# Patient Record
Sex: Female | Born: 1955
Health system: Southern US, Community
[De-identification: ages and names within clinical notes are randomized; demographics above are authoritative.]

## PROBLEM LIST (undated history)

## (undated) DIAGNOSIS — E785 Hyperlipidemia, unspecified: Secondary | ICD-10-CM

## (undated) DIAGNOSIS — K219 Gastro-esophageal reflux disease without esophagitis: Secondary | ICD-10-CM

## (undated) DIAGNOSIS — G4700 Insomnia, unspecified: Secondary | ICD-10-CM

## (undated) DIAGNOSIS — L409 Psoriasis, unspecified: Secondary | ICD-10-CM

## (undated) DIAGNOSIS — E039 Hypothyroidism, unspecified: Secondary | ICD-10-CM

## (undated) DIAGNOSIS — E782 Mixed hyperlipidemia: Secondary | ICD-10-CM

## (undated) HISTORY — DX: Hyperlipidemia, unspecified: E78.5

## (undated) HISTORY — DX: Gastro-esophageal reflux disease without esophagitis: K21.9

## (undated) HISTORY — DX: Hypothyroidism, unspecified: E03.9

---
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
## 1988-04-25 HISTORY — PX: BREAST EXCISIONAL BIOPSY: SUR124

## 1991-04-26 HISTORY — PX: MYOMECTOMY: SHX85

## 1996-04-25 HISTORY — PX: CHOLECYSTECTOMY: SHX55

## 1997-09-26 ENCOUNTER — Observation Stay (HOSPITAL_COMMUNITY): Admission: RE | Admit: 1997-09-26 | Discharge: 1997-09-27 | Payer: Self-pay

## 1998-03-31 ENCOUNTER — Ambulatory Visit (HOSPITAL_COMMUNITY): Admission: RE | Admit: 1998-03-31 | Discharge: 1998-03-31 | Payer: Self-pay | Admitting: Gastroenterology

## 1998-09-14 ENCOUNTER — Other Ambulatory Visit: Admission: RE | Admit: 1998-09-14 | Discharge: 1998-09-14 | Payer: Self-pay | Admitting: Obstetrics and Gynecology

## 1998-09-30 ENCOUNTER — Other Ambulatory Visit: Admission: RE | Admit: 1998-09-30 | Discharge: 1998-09-30 | Payer: Self-pay | Admitting: Obstetrics and Gynecology

## 1999-09-28 ENCOUNTER — Other Ambulatory Visit: Admission: RE | Admit: 1999-09-28 | Discharge: 1999-09-28 | Payer: Self-pay | Admitting: Obstetrics and Gynecology

## 2000-10-25 ENCOUNTER — Other Ambulatory Visit: Admission: RE | Admit: 2000-10-25 | Discharge: 2000-10-25 | Payer: Self-pay | Admitting: Obstetrics and Gynecology

## 2002-01-01 ENCOUNTER — Other Ambulatory Visit: Admission: RE | Admit: 2002-01-01 | Discharge: 2002-01-01 | Payer: Self-pay | Admitting: Obstetrics and Gynecology

## 2003-01-22 ENCOUNTER — Other Ambulatory Visit: Admission: RE | Admit: 2003-01-22 | Discharge: 2003-01-22 | Payer: Self-pay | Admitting: Family Medicine

## 2004-07-28 ENCOUNTER — Ambulatory Visit: Payer: Self-pay | Admitting: Family Medicine

## 2004-08-04 ENCOUNTER — Other Ambulatory Visit: Admission: RE | Admit: 2004-08-04 | Discharge: 2004-08-04 | Payer: Self-pay | Admitting: Family Medicine

## 2004-08-04 ENCOUNTER — Ambulatory Visit: Payer: Self-pay | Admitting: Family Medicine

## 2004-08-04 LAB — CONVERTED CEMR LAB

## 2004-09-15 ENCOUNTER — Ambulatory Visit (HOSPITAL_COMMUNITY): Admission: RE | Admit: 2004-09-15 | Discharge: 2004-09-15 | Payer: Self-pay | Admitting: Family Medicine

## 2005-09-27 ENCOUNTER — Ambulatory Visit (HOSPITAL_COMMUNITY): Admission: RE | Admit: 2005-09-27 | Discharge: 2005-09-27 | Payer: Self-pay | Admitting: Obstetrics and Gynecology

## 2006-10-05 ENCOUNTER — Ambulatory Visit: Payer: Self-pay | Admitting: Family Medicine

## 2006-11-01 ENCOUNTER — Encounter (INDEPENDENT_AMBULATORY_CARE_PROVIDER_SITE_OTHER): Payer: Self-pay | Admitting: Gastroenterology

## 2006-11-01 ENCOUNTER — Ambulatory Visit (HOSPITAL_COMMUNITY): Admission: RE | Admit: 2006-11-01 | Discharge: 2006-11-01 | Payer: Self-pay | Admitting: Gastroenterology

## 2006-11-06 ENCOUNTER — Encounter: Payer: Self-pay | Admitting: Family Medicine

## 2006-11-06 DIAGNOSIS — E039 Hypothyroidism, unspecified: Secondary | ICD-10-CM | POA: Insufficient documentation

## 2006-11-06 DIAGNOSIS — K219 Gastro-esophageal reflux disease without esophagitis: Secondary | ICD-10-CM | POA: Insufficient documentation

## 2006-11-06 DIAGNOSIS — J45909 Unspecified asthma, uncomplicated: Secondary | ICD-10-CM | POA: Insufficient documentation

## 2006-11-06 DIAGNOSIS — J309 Allergic rhinitis, unspecified: Secondary | ICD-10-CM | POA: Insufficient documentation

## 2006-11-28 ENCOUNTER — Ambulatory Visit: Payer: Self-pay | Admitting: Family Medicine

## 2006-11-28 DIAGNOSIS — M25569 Pain in unspecified knee: Secondary | ICD-10-CM

## 2006-11-28 DIAGNOSIS — L259 Unspecified contact dermatitis, unspecified cause: Secondary | ICD-10-CM | POA: Insufficient documentation

## 2006-11-28 DIAGNOSIS — M7061 Trochanteric bursitis, right hip: Secondary | ICD-10-CM | POA: Insufficient documentation

## 2006-11-28 LAB — CONVERTED CEMR LAB
AST: 27 units/L (ref 0–37)
Bilirubin, Direct: 0.1 mg/dL (ref 0.0–0.3)
Cholesterol: 237 mg/dL (ref 0–200)
Direct LDL: 145.2 mg/dL
Total Bilirubin: 0.8 mg/dL (ref 0.3–1.2)
Total CHOL/HDL Ratio: 4
Uric Acid, Serum: 5.7 mg/dL (ref 2.4–7.0)
VLDL: 28 mg/dL (ref 0–40)

## 2007-10-17 ENCOUNTER — Ambulatory Visit (HOSPITAL_COMMUNITY): Admission: RE | Admit: 2007-10-17 | Discharge: 2007-10-17 | Payer: Self-pay | Admitting: Obstetrics and Gynecology

## 2008-06-09 ENCOUNTER — Ambulatory Visit: Payer: Self-pay | Admitting: Family Medicine

## 2009-02-19 ENCOUNTER — Ambulatory Visit (HOSPITAL_COMMUNITY): Admission: RE | Admit: 2009-02-19 | Discharge: 2009-02-19 | Payer: Self-pay | Admitting: Obstetrics and Gynecology

## 2009-08-12 ENCOUNTER — Ambulatory Visit: Payer: Self-pay | Admitting: Family Medicine

## 2009-08-12 DIAGNOSIS — Z888 Allergy status to other drugs, medicaments and biological substances status: Secondary | ICD-10-CM | POA: Insufficient documentation

## 2009-08-12 LAB — CONVERTED CEMR LAB
ALT: 36 units/L — ABNORMAL HIGH (ref 0–35)
Albumin: 3.1 g/dL — ABNORMAL LOW (ref 3.5–5.2)
Alkaline Phosphatase: 101 units/L (ref 39–117)
BUN: 21 mg/dL (ref 6–23)
Basophils Absolute: 0 10*3/uL (ref 0.0–0.1)
Creatinine, Ser: 1.8 mg/dL — ABNORMAL HIGH (ref 0.4–1.2)
Eosinophils Relative: 2.7 % (ref 0.0–5.0)
HCT: 42.1 % (ref 36.0–46.0)
Hemoglobin: 14.6 g/dL (ref 12.0–15.0)
Lymphocytes Relative: 1 % — ABNORMAL LOW (ref 12.0–46.0)
Lymphs Abs: 0.1 10*3/uL — ABNORMAL LOW (ref 0.7–4.0)
Monocytes Absolute: 0.2 10*3/uL (ref 0.1–1.0)
Monocytes Relative: 1.3 % — ABNORMAL LOW (ref 3.0–12.0)
Neutro Abs: 12 10*3/uL — ABNORMAL HIGH (ref 1.4–7.7)
Neutrophils Relative %: 95 % — ABNORMAL HIGH (ref 43.0–77.0)
Potassium: 4.5 meq/L (ref 3.5–5.1)
RBC: 4.49 M/uL (ref 3.87–5.11)
Total Bilirubin: 1 mg/dL (ref 0.3–1.2)

## 2009-08-13 ENCOUNTER — Ambulatory Visit: Payer: Self-pay | Admitting: Family Medicine

## 2009-08-13 DIAGNOSIS — N259 Disorder resulting from impaired renal tubular function, unspecified: Secondary | ICD-10-CM | POA: Insufficient documentation

## 2009-08-19 ENCOUNTER — Ambulatory Visit: Payer: Self-pay | Admitting: Family Medicine

## 2009-08-24 ENCOUNTER — Telehealth: Payer: Self-pay | Admitting: Family Medicine

## 2009-08-24 LAB — CONVERTED CEMR LAB
ALT: 38 units/L — ABNORMAL HIGH (ref 0–35)
Albumin: 3.6 g/dL (ref 3.5–5.2)
Alkaline Phosphatase: 79 units/L (ref 39–117)
BUN: 10 mg/dL (ref 6–23)
Basophils Absolute: 0 10*3/uL (ref 0.0–0.1)
CO2: 30 meq/L (ref 19–32)
Chloride: 101 meq/L (ref 96–112)
GFR calc non Af Amer: 55.43 mL/min (ref >60)
Glucose, Bld: 109 mg/dL — ABNORMAL HIGH (ref 70–99)
Hemoglobin: 14.5 g/dL (ref 12.0–15.0)
Lymphocytes Relative: 14 % (ref 12.0–46.0)
Lymphs Abs: 1.5 10*3/uL (ref 0.7–4.0)
Monocytes Absolute: 0.7 10*3/uL (ref 0.1–1.0)
Monocytes Relative: 6.6 % (ref 3.0–12.0)
Neutrophils Relative %: 78.5 % — ABNORMAL HIGH (ref 43.0–77.0)
Total Protein: 6.3 g/dL (ref 6.0–8.3)
WBC: 11 10*3/uL — ABNORMAL HIGH (ref 4.5–10.5)

## 2010-04-09 ENCOUNTER — Ambulatory Visit: Payer: Self-pay | Admitting: Family Medicine

## 2010-04-28 ENCOUNTER — Ambulatory Visit (HOSPITAL_COMMUNITY)
Admission: RE | Admit: 2010-04-28 | Discharge: 2010-04-28 | Payer: Self-pay | Source: Home / Self Care | Attending: Obstetrics and Gynecology | Admitting: Obstetrics and Gynecology

## 2010-05-16 ENCOUNTER — Encounter: Payer: Self-pay | Admitting: Obstetrics and Gynecology

## 2010-05-25 NOTE — Progress Notes (Signed)
Summary: lab appointment  Phone Note Call from Patient Call back at Home Phone [phone]   Summary of Call: patient would like to know if she should keep her appointment with the lab on Wednesday if her labs are normal? Initial call taken by: Kern Reap CMA Duncan Dull),  Aug 24, 2009 1:59 PM  Follow-up for Phone Call        no. Follow-up by: Roderick Pee MD,  Aug 24, 2009 2:02 PM  Additional Follow-up for Phone Call Additional follow up Details #1::        left message on machine for patient and lab appointment cancelled Additional Follow-up by: Kern Reap CMA Duncan Dull),  Aug 24, 2009 2:31 PM

## 2010-05-25 NOTE — Assessment & Plan Note (Signed)
Summary: 1 wk rov/njr   Vital Signs:  Patient profile:   55 year old female Weight:      174 pounds Temp:     98.4 degrees F oral BP sitting:   130 / 90  (left arm)  Vitals Entered By: Kern Reap CMA Duncan Dull) (August 19, 2009 8:32 AM) CC: follow-up visit   CC:  follow-up visit.  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  See details from previous notes.  She is now on a tapering dose of prednisone.  She is down to 20 mg a day.  Rash is gone.  As noted she did have some metabolic abnormalities.  Be met will be repeated next week  Allergies: 1)  ! Penicillin  Review of Systems      See HPI  Physical Exam  General:  Well-developed,well-nourished,in no acute distress; alert,appropriate and cooperative throughout examination Skin:  Intact without suspicious lesions or rashes   Impression & Recommendations:  Problem # 1:  ADVERSE DRUG REACTION, SULFA (AVW-098.11) Assessment Improved  Orders: TLB-CBC Platelet - w/Differential (85025-CBCD) TLB-Hepatic/Liver Function Pnl (80076-HEPATIC)  Complete Medication List: 1)  Advair Diskus 250-50 Mcg/dose Misc (Fluticasone-salmeterol) .... One puff twice daily 2)  Synthroid 112 Mcg Tabs (Levothyroxine sodium) .... One by mouth daily 3)  Proventil Hfa 108 (90 Base) Mcg/act Aers (Albuterol sulfate) .... Prn 4)  Lidex 0.05 % Crea (Fluocinonide) .... Apply two times a day 5)  Vitamin D (ergocalciferol) 50000 Unit Caps (Ergocalciferol) .... Take one tab by mouth once daily 6)  Sulfamethoxazole-tmp Ds 800-160 Mg Tabs (Sulfamethoxazole-trimethoprim) .... Take one tab by mouth two times a day 7)  Angeliq 0.5-1 Mg Tabs (Drospirenone-estradiol) .... Take one tab by mouth once daily 8)  Prednisone 20 Mg Tabs (Prednisone) .... Uad  Other Orders: TLB-BMP (Basic Metabolic Panel-BMET) (80048-METABOL)  Patient Instructions: 1)  decrease her  prednisone to 10 mg Friday, Saturday, Sunday, and then 10 mg Monday, Wednesday, Friday, for a 3-week taper. 2)  Nonfasting bmet next Tuesday or Wednesday.  I will call you to report

## 2010-05-25 NOTE — Assessment & Plan Note (Signed)
Summary: ROA/FUP/@8 :15 PER DR/RCD   Vital Signs:  Patient profile:   55 year old female Temp:     98.2 degrees F oral BP sitting:   102 / 70  (left arm)  Vitals Entered By: Kern Reap CMA Duncan Dull) (August 13, 2009 8:15 AM) CC: follow-up visit   CC:  follow-up visit.  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  We saw her yesterday and start her on prednisone 60 mg orally stat then 60 mg last night.  She states her rash is starting to fade.  She otherwise feels well.  Her laboratory data shows a creatinine of 1.8 with a GFR 31.  She states her GYN who follows her blood work has told her in the past, that she's had an elevation of her creatinine.  She's not had a history of any renal disease.  Allergies: 1)  ! Penicillin  Past History:  Past medical, surgical, family and social histories (including risk factors) reviewed, and no changes noted (except as noted below).  Past Medical History: Reviewed history from 11/06/2006 and no changes required. GERD Hypothyroidism Allergic rhinitis Asthma  Past Surgical History: Reviewed history from 11/06/2006 and no changes required. C/S 94,96' MYOMECTOMY-93' GALLBLADDER-98OR99 BREAST BIOPSY-90'  Family History: Reviewed history from 11/28/2006 and no changes required. her brother recently died from alcoholism.  He had had the Crohn's disease was also an alcoholic.  Her mother was also an alcoholic.  She drinks two glasses of white wine a day at bedtime.  Her father is in assisted living is paranoid schizophrenic.  Social History: Reviewed history from 11/28/2006 and no changes required. she continues to work at [employer].  Her two children are healthy and well.  Review of Systems      See HPI  Physical Exam  General:  Well-developed,well-nourished,in no acute distress; alert,appropriate and cooperative  throughout examination Skin:  diffuse urticaria   Problems:  Medical Problems Added: 1)  Dx of Renal Insufficiency  (ICD-588.9)  Impression & Recommendations:  Problem # 1:  ADVERSE DRUG REACTION, SULFA (ZOX-096.04) Assessment Improved  Problem # 2:  RENAL INSUFFICIENCY (ICD-588.9) Assessment: New  Complete Medication List: 1)  Advair Diskus 250-50 Mcg/dose Misc (Fluticasone-salmeterol) .... One puff twice daily 2)  Synthroid 112 Mcg Tabs (Levothyroxine sodium) .... One by mouth daily 3)  Proventil Hfa 108 (90 Base) Mcg/act Aers (Albuterol sulfate) .... Prn 4)  Lidex 0.05 % Crea (Fluocinonide) .... Apply two times a day 5)  Vitamin D (ergocalciferol) 50000 Unit Caps (Ergocalciferol) .... Take one tab by mouth once daily 6)  Sulfamethoxazole-tmp Ds 800-160 Mg Tabs (Sulfamethoxazole-trimethoprim) .... Take one tab by mouth two times a day 7)  Angeliq 0.5-1 Mg Tabs (Drospirenone-estradiol) .... Take one tab by mouth once daily 8)  Prednisone 20 Mg Tabs (Prednisone) .... Uad  Patient Instructions: 1)  take 60 mg of prednisone, x 3 days, 40 mg x 3 days, 20 mg x 3 days, 10 mg x 3 days, then 10 mg Monday, Wednesday, Friday, for a two week taper.  Do not take any NSAIDs. 2)   30 ounces of water daily. 3)  Stop the HRT. 4)  Return next Thursday for follow-up

## 2010-05-25 NOTE — Assessment & Plan Note (Signed)
Summary: chills/body aches/fever/cjr   Vital Signs:  Patient profile:   55 year old female Height:      63 inches Weight:      177 pounds BMI:     31.47 Temp:     98.5 degrees F oral BP sitting:   102 / 72  (left arm) Cuff size:   regular  Vitals Entered By: Kern Reap CMA Duncan Dull) (August 12, 2009 9:19 AM) CC: body aches, chills Is Patient Diabetic? No   CC:  body aches and chills.  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  This past weekend.  She was at the hospital.  And complained to her OB about an infected ingrown toenail.  Her OB gave her some Septra.  She's not had a history of any reactions to Septra in the past.  However, she does have a history of penicillin reactions.  A couple hours after that.  She took the Septra she began having fever, chills, and a skin rash.  Review of systems otherwise negative  Allergies: 1)  ! Penicillin  Past History:  Past medical, surgical, family and social histories (including risk factors) reviewed for relevance to current acute and chronic problems.  Past Medical History: Reviewed history from 11/06/2006 and no changes required. GERD Hypothyroidism Allergic rhinitis Asthma  Past Surgical History: Reviewed history from 11/06/2006 and no changes required. C/S 94,96' MYOMECTOMY-93' GALLBLADDER-98OR99 BREAST BIOPSY-90'  Family History: Reviewed history from 11/28/2006 and no changes required. her brother recently died from alcoholism.  He had had the Crohn's disease was also an alcoholic.  Her mother was also an alcoholic.  She drinks two glasses of white wine a day at bedtime.  Her father is in assisted living is paranoid schizophrenic.  Social History: Reviewed history from 11/28/2006 and no changes required. she continues to work at [employer].  Her two children are healthy and well.  Review of Systems      See HPI  Physical  Exam  General:  Well-developed,well-nourished,in no acute distress; alert,appropriate and cooperative throughout examination Skin:  diffuse skin rash, consistent with a allergic reaction   Impression & Recommendations:  Problem # 1:  ADVERSE DRUG REACTION, SULFA (YHC-623.76) Assessment New  Orders: Venipuncture (28315) TLB-BMP (Basic Metabolic Panel-BMET) (80048-METABOL) TLB-CBC Platelet - w/Differential (85025-CBCD) TLB-Hepatic/Liver Function Pnl (80076-HEPATIC) Prescription Created Electronically [phone])  Complete Medication List: 1)  Advair Diskus 250-50 Mcg/dose Misc (Fluticasone-salmeterol) .... One puff twice daily 2)  Synthroid 112 Mcg Tabs (Levothyroxine sodium) .... One by mouth daily 3)  Proventil Hfa 108 (90 Base) Mcg/act Aers (Albuterol sulfate) .... Prn 4)  Lidex 0.05 % Crea (Fluocinonide) .... Apply two times a day 5)  Vitamin D (ergocalciferol) 50000 Unit Caps (Ergocalciferol) .... Take one tab by mouth once daily 6)  Sulfamethoxazole-tmp Ds 800-160 Mg Tabs (Sulfamethoxazole-trimethoprim) .... Take one tab by mouth two times a day 7)  Angeliq 0.5-1 Mg Tabs (Drospirenone-estradiol) .... Take one tab by mouth once daily 8)  Prednisone 20 Mg Tabs (Prednisone) .... Uad  Patient Instructions: 1)  take 60 mg of prednisone now 60 mg at bedtime tonight.  Return at 815 in the morning for follow-up Prescriptions: PREDNISONE 20 MG TABS (PREDNISONE) UAD  #50 x 1   Entered and Authorized by:   Roderick Pee MD   Signed by:   Roderick Pee MD on 08/12/2009   Method used:   Electronically to  [facility] [phone]* (retail)       [address]       [place], [place]  [address]       Ph: [phone] or [phone]       Fax: [phone]   RxID:   [id number]

## 2010-07-13 ENCOUNTER — Encounter: Payer: Self-pay | Admitting: Family Medicine

## 2010-07-13 ENCOUNTER — Ambulatory Visit (INDEPENDENT_AMBULATORY_CARE_PROVIDER_SITE_OTHER): Payer: 59 | Admitting: Family Medicine

## 2010-07-13 VITALS — BP 120/90 | Temp 98.2°F | Ht 62.5 in | Wt 182.0 lb

## 2010-07-13 DIAGNOSIS — J45909 Unspecified asthma, uncomplicated: Secondary | ICD-10-CM

## 2010-07-13 MED ORDER — PREDNISONE 20 MG PO TABS: ORAL_TABLET | ORAL | Status: DC

## 2010-07-13 MED ORDER — HYDROCODONE-HOMATROPINE 5-1.5 MG/5ML PO SYRP: 2.5000 mL | ORAL_SOLUTION | Freq: Four times a day (QID) | ORAL | Status: DC | PRN

## 2010-07-13 NOTE — Patient Instructions (Signed)
Prednisone 3 tabs now then starting tomorrow morning, two tabs x 3 days............ Or until y feel a lot better........ Then taper by taking one tab x 3 days a half tabs x 3 days and then half a tablet Monday, Wednesday, Friday, for a 3-week taper.  Continue your other medications.  Hydro met one half to 1 teaspoon nightly p.r.n.

## 2010-07-13 NOTE — Progress Notes (Signed)
  Subjective:    Patient ID: Susan Burch, female    DOB: 24-Jan-1956, 55 y.o.   MRN: [id number]  HPI Susan Burch is a 55 year old female, who comes in with a 4-day history of wheezing.  She has a history of allergic rhinitis and asthma.  She takes Symbocort  two puffs b.i.d., Xopenex p.r.n., Singulair 10 nightly, and steroid nasal spray.  Last week she began coughing and wheezing.Review of systems otherwise negative.  She was able to sleep last night, but had taken Ambien   Review of Systems    General and pulmonary review of systems otherwise negative Objective:   Physical Exam Well-developed well-nourished, female in no acute distress.  HEENT negative.  Neck supple.  Thyroid not enlarged.  No adenopathy.  Lungs are clear except for late expiratory wheezing bilaterally       Assessment & Plan:  Asthma.........Marland Kitchen Restart prednisone 60 mg now then 40 daily, x 3 days and taper.  Drink lots of liquids.  Hydromet one half to 1 teaspoon nightly p.r.n. Cough.  Return p.r.n.

## 2010-07-17 ENCOUNTER — Ambulatory Visit (INDEPENDENT_AMBULATORY_CARE_PROVIDER_SITE_OTHER): Payer: 59

## 2010-07-17 ENCOUNTER — Inpatient Hospital Stay (INDEPENDENT_AMBULATORY_CARE_PROVIDER_SITE_OTHER)
Admission: RE | Admit: 2010-07-17 | Discharge: 2010-07-17 | Disposition: A | Discharge status: Home / Self Care | Payer: 59 | Source: Ambulatory Visit | Attending: Emergency Medicine | Admitting: Emergency Medicine

## 2010-07-17 DIAGNOSIS — J45909 Unspecified asthma, uncomplicated: Secondary | ICD-10-CM

## 2010-07-20 ENCOUNTER — Telehealth: Payer: Self-pay | Admitting: Family Medicine

## 2010-07-20 DIAGNOSIS — J45909 Unspecified asthma, uncomplicated: Secondary | ICD-10-CM

## 2010-07-20 NOTE — Telephone Encounter (Signed)
Pt called and is out of the HYDROcodone-homatropine (HYDROMET) 5-1.5 MG/5ML syrup and pharmacy will not let pt refill because it is too soon. Pt is req Dr Tawanna Cooler to refill Dambrosia. Pls call in to [facility] [phone]

## 2010-07-20 NOTE — Telephone Encounter (Signed)
Hydromet 8 ounces directions one half to 1 teaspoon nightly p.r.n. Cough, refills x 1

## 2010-07-21 MED ORDER — HYDROCODONE-HOMATROPINE 5-1.5 MG/5ML PO SYRP: 2.5000 mL | ORAL_SOLUTION | Freq: Four times a day (QID) | ORAL | Status: AC | PRN

## 2010-09-07 NOTE — Op Note (Signed)
NAME:  Susan Burch, Susan Burch                  ACCOUNT NO.:  [id number]   MEDICAL RECORD NO.:  [id number]          PATIENT TYPE:  AMB   LOCATION:  ENDO                         FACILITY:  [facility]   PHYSICIAN:  Anselmo Rod, M.D.  DATE OF BIRTH:  1955/04/26   DATE OF PROCEDURE:  11/01/2006  DATE OF DISCHARGE:                               OPERATIVE REPORT   PROCEDURE PERFORMED:  Colonoscopy with cold biopsies x 3.   ENDOSCOPIST:  Anselmo Rod, M.D.   INSTRUMENT USED:  Pentax video colonoscope.   INDICATIONS FOR PROCEDURE:  A 55 year old white female with a history of  constipation and occasional rectal bleeding, undergoing screening  colonoscopy to rule out colonic polyps, masses, etc.   PREPROCEDURE PREPARATION:  Informed consent was procured from the  patient. The patient was fasted for eight hours prior to the procedure  and prepped with Dulcolax pills and a bottle of magnesium citrate and  NuLytely the night prior to the procedure.  The patient however, did not  consume the whole gallon of NuLytely as it made her sick. The risks and  benefits of the procedure including a 10% miss rate for cancer or polyps  was discussed with the patient as well.   PREPROCEDURE PHYSICAL:  The patient had stable vital signs.  Neck  supple.  Chest clear to auscultation.  S1 and S2 regular.  Abdomen soft  with normal bowel sounds.   DESCRIPTION OF PROCEDURE:  The patient was placed in left lateral  decubitus position and sedated with 100 mcg of Fentanyl and 10 mg of  Versed in slow incremental doses.  Once the patient was adequately  sedated and maintained on low flow oxygen and continuous cardiac  monitoring, the Pentax video colonoscope was advanced from the rectum to  the cecum. Two small sessile polyps were biopsied in the rectosigmoid  colon (four biopsies x2). The rest of the colonic mucosa up to the  terminal ileum appeared healthy. The appendicular orifice and ileocecal  valve were visualized  and photographed. There was some residual stool in  the colon and multiple washes were done.  Small lesions could be missed.  The terminal ileum appeared healthy without lesions.  Small internal  hemorrhoids were seen on retroflexion. There was no evidence of  diverticulosis.   IMPRESSION:  1. Two small sessile polyps biopsied in the rectosigmoid colon.  2. Small internal hemorrhoids seen on retroflexion.  3. Otherwise normal exam up to the terminal ileum.   RECOMMENDATIONS:  1. Continue on a high fiber diet, regular fluid intake.  2. Await pathology results.  3. Repeat colonoscopy depending on pathology results.  4. Use Colace on a PRN basis for constipation.  5. Avoid all nonsteroidals including Aspirin for the next two weeks.  6. Outpatient followup as need arises in the future.      Anselmo Rod, M.D.  Electronically Signed     JNM/MEDQ  D:  11/01/2006  T:  11/02/2006  Job:  [id number]   cc:   Maxie Better, M.D.  Fax: [phone]   Delon [place]  Fax: [phone]

## 2011-04-27 ENCOUNTER — Other Ambulatory Visit (HOSPITAL_COMMUNITY): Payer: Self-pay | Admitting: Obstetrics and Gynecology

## 2011-04-27 DIAGNOSIS — Z1231 Encounter for screening mammogram for malignant neoplasm of breast: Secondary | ICD-10-CM

## 2011-05-24 ENCOUNTER — Ambulatory Visit (HOSPITAL_COMMUNITY)
Admission: RE | Admit: 2011-05-24 | Discharge: 2011-05-24 | Disposition: A | Discharge status: Home / Self Care | Payer: 59 | Source: Ambulatory Visit | Attending: Obstetrics and Gynecology | Admitting: Obstetrics and Gynecology

## 2011-05-24 DIAGNOSIS — Z1231 Encounter for screening mammogram for malignant neoplasm of breast: Secondary | ICD-10-CM | POA: Insufficient documentation

## 2011-09-21 ENCOUNTER — Other Ambulatory Visit (INDEPENDENT_AMBULATORY_CARE_PROVIDER_SITE_OTHER): Payer: 59

## 2011-09-21 DIAGNOSIS — Z0000 Encounter for general adult medical examination without abnormal findings: Secondary | ICD-10-CM

## 2011-09-21 LAB — CBC WITH DIFFERENTIAL/PLATELET
Eosinophils Relative: 1.4 % (ref 0.0–5.0)
Lymphocytes Relative: 21.8 % (ref 12.0–46.0)
Lymphs Abs: 1.2 10*3/uL (ref 0.7–4.0)
MCHC: 33.5 g/dL (ref 30.0–36.0)
Monocytes Relative: 13 % — ABNORMAL HIGH (ref 3.0–12.0)
Neutrophils Relative %: 63.3 % (ref 43.0–77.0)
Platelets: 248 10*3/uL (ref 150.0–400.0)
RDW: 14.1 % (ref 11.5–14.6)

## 2011-09-21 LAB — BASIC METABOLIC PANEL
BUN: 13 mg/dL (ref 6–23)
CO2: 26 mEq/L (ref 19–32)
Calcium: 8.8 mg/dL (ref 8.4–10.5)
Chloride: 105 mEq/L (ref 96–112)
Potassium: 4.3 mEq/L (ref 3.5–5.1)
Sodium: 139 mEq/L (ref 135–145)

## 2011-09-21 LAB — POCT URINALYSIS DIPSTICK
Bilirubin, UA: NEGATIVE
Glucose, UA: NEGATIVE
Ketones, UA: NEGATIVE
Spec Grav, UA: 1.015
Urobilinogen, UA: 0.2

## 2011-09-21 LAB — HEPATIC FUNCTION PANEL
ALT: 23 U/L (ref 0–35)
Alkaline Phosphatase: 63 U/L (ref 39–117)
Bilirubin, Direct: 0 mg/dL (ref 0.0–0.3)

## 2011-09-21 LAB — LDL CHOLESTEROL, DIRECT: Direct LDL: 148.7 mg/dL

## 2011-09-21 LAB — LIPID PANEL
Cholesterol: 215 mg/dL — ABNORMAL HIGH (ref 0–200)
HDL: 59.6 mg/dL (ref >39.00)
Triglycerides: 114 mg/dL (ref 0.0–149.0)
VLDL: 22.8 mg/dL (ref 0.0–40.0)

## 2011-09-27 ENCOUNTER — Ambulatory Visit (INDEPENDENT_AMBULATORY_CARE_PROVIDER_SITE_OTHER): Payer: 59 | Admitting: Family Medicine

## 2011-09-27 ENCOUNTER — Encounter: Payer: Self-pay | Admitting: Family Medicine

## 2011-09-27 VITALS — BP 120/84 | Temp 98.3°F | Ht 63.0 in | Wt 190.0 lb

## 2011-09-27 DIAGNOSIS — L259 Unspecified contact dermatitis, unspecified cause: Secondary | ICD-10-CM

## 2011-09-27 DIAGNOSIS — J45909 Unspecified asthma, uncomplicated: Secondary | ICD-10-CM

## 2011-09-27 DIAGNOSIS — J309 Allergic rhinitis, unspecified: Secondary | ICD-10-CM

## 2011-09-27 DIAGNOSIS — G4700 Insomnia, unspecified: Secondary | ICD-10-CM

## 2011-09-27 DIAGNOSIS — E039 Hypothyroidism, unspecified: Secondary | ICD-10-CM

## 2011-09-27 DIAGNOSIS — K219 Gastro-esophageal reflux disease without esophagitis: Secondary | ICD-10-CM

## 2011-09-27 DIAGNOSIS — Z0000 Encounter for general adult medical examination without abnormal findings: Secondary | ICD-10-CM

## 2011-09-27 HISTORY — DX: Insomnia, unspecified: G47.00

## 2011-09-27 MED ORDER — LEVOTHYROXINE SODIUM 112 MCG PO TABS: 112.0000 ug | ORAL_TABLET | Freq: Every day | ORAL | Status: DC

## 2011-09-27 MED ORDER — ZOLPIDEM TARTRATE 5 MG PO TABS: ORAL_TABLET | ORAL | Status: DC

## 2011-09-27 NOTE — Progress Notes (Signed)
  Subjective:    Patient ID: Susan Burch, female    DOB: 20-Feb-1956, 56 y.o.   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 [place] Callas  She has a history of postmenopausal symptoms currently treated by her GYN doctor cousins with Provera and estrogen  She takes Synthroid 112 mcg daily for hypothyroidism and Ambien 5 mg one half tab when necessary for sleep dysfunction.  She was recently rated by the house systems because of hyperlipidemia. However her lipids are normal. Her total cholesterol slightly elevated 2:15 however triglycerides 114, HDL 59.6, and LDL  at 148. This is a normal lipid panel and she should not be rated  Referred to Dr. Vonna Kotyk for an eye exam, regular dental care, and you mammography at [facility], she does not do BSE monthly. She does have light skin in [place] freckles and we'll do a complete skin exam. She had a colonoscopy in 2010 which was normal except for some polyps. She was told by her GI to come back in 10 years  Tetanus 2008, Pneumovax 2000   Review of Systems  Constitutional: Negative.   HENT: Negative.   Eyes: Negative.   Respiratory: Negative.   Cardiovascular: Negative.   Gastrointestinal: Negative.   Genitourinary: Negative.   Musculoskeletal: Negative.   Neurological: Negative.   Hematological: Negative.   Psychiatric/Behavioral: Negative.        Objective:   Physical Exam  Constitutional: She appears well-developed and well-nourished.  HENT:  Head: Normocephalic and atraumatic.  Right Ear: External ear normal.  Left Ear: External ear normal.  Nose: Nose normal.  Mouth/Throat: Oropharynx is clear and moist.  Eyes: EOM are normal. Pupils are equal, round, and reactive to light.  Neck: Normal range of motion. Neck supple. No thyromegaly present.  Cardiovascular: Normal rate,  regular rhythm, normal heart sounds and intact distal pulses.  Exam reveals no gallop and no friction rub.   No murmur heard. Pulmonary/Chest: Effort normal and breath sounds normal.  Abdominal: Soft. Bowel sounds are normal. She exhibits no distension and no mass. There is no tenderness. There is no rebound.  Genitourinary:       Bilateral breast exam normal  Musculoskeletal: Normal range of motion.  Lymphadenopathy:    She has no cervical adenopathy.  Neurological: She is alert. She has normal reflexes. No cranial nerve deficit. She exhibits normal muscle tone. Coordination normal.  Skin: Skin is warm and dry.       She has the [place]  skin type with many many many freckles had detailed body exam shows no abnormal appearing lesion  Psychiatric: She has a normal mood and affect. Her behavior is normal. Judgment and thought content normal.          Assessment & Plan:  Healthy female  Allergic rhinitis and asthma continue followup by Dr. [place] Callas  Postmenopausal symptoms followup by GYN  Hypothyroidism continue Synthroid 112 daily  Occasional sleep dysfunction Ambien 5 mg one half tab each bedtime when necessary  Recommend annual eye exam and monthly BSE.

## 2011-09-27 NOTE — Patient Instructions (Signed)
Continue your current medications  Ambien 5 mg,,,,,,,,, one half tab each bedtime for sleep dysfunction  Again your lipid panel is normal and you should not be rated.  Be sure to use the SPF 50+ sunscreens  Return in one year for general physical examination sooner if any problems  Do a thorough skin and breast exam monthly at home

## 2011-12-15 ENCOUNTER — Institutional Professional Consult (permissible substitution): Payer: 59 | Admitting: Critical Care Medicine

## 2012-07-18 ENCOUNTER — Other Ambulatory Visit (HOSPITAL_COMMUNITY): Payer: Self-pay | Admitting: Obstetrics and Gynecology

## 2012-07-18 DIAGNOSIS — Z1231 Encounter for screening mammogram for malignant neoplasm of breast: Secondary | ICD-10-CM

## 2012-09-12 ENCOUNTER — Ambulatory Visit (HOSPITAL_COMMUNITY)
Admission: RE | Admit: 2012-09-12 | Discharge: 2012-09-12 | Disposition: A | Discharge status: Home / Self Care | Payer: 59 | Source: Ambulatory Visit | Attending: Obstetrics and Gynecology | Admitting: Obstetrics and Gynecology

## 2012-09-12 DIAGNOSIS — Z1231 Encounter for screening mammogram for malignant neoplasm of breast: Secondary | ICD-10-CM | POA: Insufficient documentation

## 2012-11-14 ENCOUNTER — Other Ambulatory Visit: Payer: Self-pay | Admitting: *Deleted

## 2012-11-14 DIAGNOSIS — E039 Hypothyroidism, unspecified: Secondary | ICD-10-CM

## 2012-11-14 MED ORDER — LEVOTHYROXINE SODIUM 112 MCG PO TABS: 112.0000 ug | ORAL_TABLET | Freq: Every day | ORAL | Status: DC

## 2013-02-08 ENCOUNTER — Other Ambulatory Visit (INDEPENDENT_AMBULATORY_CARE_PROVIDER_SITE_OTHER): Payer: 59

## 2013-02-08 DIAGNOSIS — Z0000 Encounter for general adult medical examination without abnormal findings: Secondary | ICD-10-CM

## 2013-02-08 LAB — HEPATIC FUNCTION PANEL
Bilirubin, Direct: 0.1 mg/dL (ref 0.0–0.3)
Total Bilirubin: 0.7 mg/dL (ref 0.3–1.2)

## 2013-02-08 LAB — CBC WITH DIFFERENTIAL/PLATELET
Eosinophils Relative: 2.1 % (ref 0.0–5.0)
HCT: 44.8 % (ref 36.0–46.0)
Hemoglobin: 15.4 g/dL — ABNORMAL HIGH (ref 12.0–15.0)
Lymphs Abs: 1.3 10*3/uL (ref 0.7–4.0)
MCV: 95.6 fl (ref 78.0–100.0)
Monocytes Absolute: 0.8 10*3/uL (ref 0.1–1.0)
Monocytes Relative: 10.8 % (ref 3.0–12.0)
Neutro Abs: 5.2 10*3/uL (ref 1.4–7.7)
Platelets: 289 10*3/uL (ref 150.0–400.0)
RDW: 13.7 % (ref 11.5–14.6)
WBC: 7.5 10*3/uL (ref 4.5–10.5)

## 2013-02-08 LAB — POCT URINALYSIS DIPSTICK
Bilirubin, UA: NEGATIVE
Glucose, UA: NEGATIVE
Leukocytes, UA: NEGATIVE
Nitrite, UA: NEGATIVE
Urobilinogen, UA: 0.2

## 2013-02-08 LAB — BASIC METABOLIC PANEL
BUN: 13 mg/dL (ref 6–23)
Chloride: 104 mEq/L (ref 96–112)
Glucose, Bld: 101 mg/dL — ABNORMAL HIGH (ref 70–99)
Potassium: 4.3 mEq/L (ref 3.5–5.1)
Sodium: 138 mEq/L (ref 135–145)

## 2013-02-08 LAB — LIPID PANEL
Cholesterol: 229 mg/dL — ABNORMAL HIGH (ref 0–200)
Total CHOL/HDL Ratio: 4
VLDL: 27.8 mg/dL (ref 0.0–40.0)

## 2013-02-08 LAB — TSH: TSH: 2.84 u[IU]/mL (ref 0.35–5.50)

## 2013-02-08 LAB — LDL CHOLESTEROL, DIRECT: Direct LDL: 158 mg/dL

## 2013-02-14 ENCOUNTER — Ambulatory Visit (INDEPENDENT_AMBULATORY_CARE_PROVIDER_SITE_OTHER): Payer: 59 | Admitting: Family Medicine

## 2013-02-14 ENCOUNTER — Encounter: Payer: Self-pay | Admitting: Family Medicine

## 2013-02-14 VITALS — BP 130/90 | Temp 98.1°F | Ht 63.0 in | Wt 196.0 lb

## 2013-02-14 DIAGNOSIS — Z23 Encounter for immunization: Secondary | ICD-10-CM

## 2013-02-14 DIAGNOSIS — K219 Gastro-esophageal reflux disease without esophagitis: Secondary | ICD-10-CM

## 2013-02-14 DIAGNOSIS — N951 Menopausal and female climacteric states: Secondary | ICD-10-CM | POA: Insufficient documentation

## 2013-02-14 DIAGNOSIS — G4700 Insomnia, unspecified: Secondary | ICD-10-CM

## 2013-02-14 DIAGNOSIS — J309 Allergic rhinitis, unspecified: Secondary | ICD-10-CM

## 2013-02-14 DIAGNOSIS — E039 Hypothyroidism, unspecified: Secondary | ICD-10-CM

## 2013-02-14 DIAGNOSIS — J45909 Unspecified asthma, uncomplicated: Secondary | ICD-10-CM

## 2013-02-14 DIAGNOSIS — N959 Unspecified menopausal and perimenopausal disorder: Secondary | ICD-10-CM

## 2013-02-14 MED ORDER — LEVOTHYROXINE SODIUM 112 MCG PO TABS: 112.0000 ug | ORAL_TABLET | Freq: Every day | ORAL | Status: DC

## 2013-02-14 MED ORDER — BUDESONIDE-FORMOTEROL FUMARATE 160-4.5 MCG/ACT IN AERO: 2.0000 | INHALATION_SPRAY | Freq: Two times a day (BID) | RESPIRATORY_TRACT | Status: DC

## 2013-02-14 MED ORDER — ALBUTEROL SULFATE HFA 108 (90 BASE) MCG/ACT IN AERS: 2.0000 | INHALATION_SPRAY | Freq: Four times a day (QID) | RESPIRATORY_TRACT | Status: DC | PRN

## 2013-02-14 NOTE — Addendum Note (Signed)
Addended by: Kern Reap B on: 02/14/2013 05:27 PM   Modules accepted: Orders

## 2013-02-14 NOTE — Progress Notes (Signed)
  Subjective:    Patient ID: Susan Burch, female    DOB: 1955/08/01, 57 y.o.   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  She takes Synthroid 112 mcg for hypothyroidism. Recent TSH level normal continue current dose  She takes Ambien 10 mg one half tab each bedtime from her GYN along with HRT. She's been on the HRT for one year however she still has hot flashes.  She takes Zantac OTC 150 twice a day for reflux  She takes 10 mg of Claritin daily when necessary for allergic rhinitis. 6  She works at [employer]. She does not exercise on a regular basis. Weight 196 6  She gets routine eye care, dental care, BSE monthly, and you mammography, colonoscopy screening in GI  Vaccinations up-to-date tetanus booster 2008 Pneumovax 2000 8 repeat Pneumovax today    Review of Systems  Constitutional: Negative.   HENT: Negative.   Eyes: Negative.   Respiratory: Negative.   Cardiovascular: Negative.   Gastrointestinal: Negative.   Endocrine: Negative.   Genitourinary: Negative.   Musculoskeletal: Negative.   Allergic/Immunologic: Negative.   Neurological: Negative.   Hematological: Negative.   Psychiatric/Behavioral: Negative.        Objective:   Physical Exam  Nursing note and vitals reviewed. Constitutional: She appears well-developed and well-nourished.  HENT:  Head: Normocephalic and atraumatic.  Right Ear: External ear normal.  Left Ear: External ear normal.  Nose: Nose normal.  Mouth/Throat: Oropharynx is clear and moist.  Eyes: EOM are normal. Pupils are equal, round, and reactive to light.  Neck: Normal range of motion. Neck supple. No thyromegaly present.  Cardiovascular: Normal rate, regular rhythm, normal heart sounds and intact distal pulses.  Exam  reveals no gallop and no friction rub.   No murmur heard. Pulmonary/Chest: Effort normal and breath sounds normal.  Abdominal: Soft. Bowel sounds are normal. She exhibits no distension and no mass. There is no tenderness. There is no rebound.  Genitourinary:  Bilateral breast exam normal except for some stretch marks 6:00 left breast that have been there for many years  Musculoskeletal: Normal range of motion.  Lymphadenopathy:    She has no cervical adenopathy.  Neurological: She is alert. She has normal reflexes. No cranial nerve deficit. She exhibits normal muscle tone. Coordination normal.  Skin: Skin is warm and dry.  Total body skin exam normal  Psychiatric: She has a normal mood and affect. Her behavior is normal. Judgment and thought content normal.          Assessment & Plan:  Healthy female  History of asthma continue current medication  Hypothyroidism continue Synthroid  Allergic rhinitis continue Claritin  Reflux esophagitis continue Zyrtec 150 twice a day  Postmenopausal HRT via GYN  Slightly overweight recommend diet exercise and beginning a walking program

## 2013-02-14 NOTE — Patient Instructions (Signed)
Let's work harder on the diet exercise and a 30 minute walking program daily  Continue other medications  Return in one year sooner if any problem

## 2013-02-28 ENCOUNTER — Telehealth: Payer: Self-pay | Admitting: *Deleted

## 2013-02-28 DIAGNOSIS — G4700 Insomnia, unspecified: Secondary | ICD-10-CM

## 2013-02-28 NOTE — Telephone Encounter (Signed)
Patient would like a refill of Ambien. Okay to refill?

## 2013-03-01 NOTE — Telephone Encounter (Signed)
Spoke with pharmacy and patient already has a refill of ambien 10 mg #30 from Dr Cherly Hensen

## 2013-06-27 ENCOUNTER — Telehealth: Payer: Self-pay | Admitting: Family Medicine

## 2013-06-27 DIAGNOSIS — J45909 Unspecified asthma, uncomplicated: Secondary | ICD-10-CM

## 2013-06-27 MED ORDER — BUDESONIDE-FORMOTEROL FUMARATE 160-4.5 MCG/ACT IN AERO: 2.0000 | INHALATION_SPRAY | Freq: Two times a day (BID) | RESPIRATORY_TRACT | Status: DC

## 2013-06-27 NOTE — Telephone Encounter (Signed)
Pt is needing new rx budesonide-formoterol (SYMBICORT) 160-4.5 MCG/ACT inhaler sent [facility] [address]

## 2013-06-27 NOTE — Telephone Encounter (Signed)
Rx sent to pharmacy   

## 2013-08-29 ENCOUNTER — Telehealth: Payer: Self-pay | Admitting: Family Medicine

## 2013-08-29 DIAGNOSIS — J45909 Unspecified asthma, uncomplicated: Secondary | ICD-10-CM

## 2013-08-29 MED ORDER — BUDESONIDE-FORMOTEROL FUMARATE 160-4.5 MCG/ACT IN AERO: 2.0000 | INHALATION_SPRAY | Freq: Two times a day (BID) | RESPIRATORY_TRACT | Status: DC

## 2013-08-29 NOTE — Telephone Encounter (Signed)
Rx sent to pharmacy   

## 2013-08-29 NOTE — Telephone Encounter (Signed)
Pt inadvertently threw her budesonide-formoterol (SYMBICORT) 160-4.5 MCG/ACT inhaler away and req a rx to get a refill

## 2013-12-17 ENCOUNTER — Encounter: Payer: Self-pay | Admitting: Family Medicine

## 2013-12-17 ENCOUNTER — Ambulatory Visit (INDEPENDENT_AMBULATORY_CARE_PROVIDER_SITE_OTHER): Payer: 59 | Admitting: Family Medicine

## 2013-12-17 VITALS — BP 136/90 | HR 80 | Temp 98.5°F | Ht 63.0 in | Wt 200.0 lb

## 2013-12-17 DIAGNOSIS — R030 Elevated blood-pressure reading, without diagnosis of hypertension: Secondary | ICD-10-CM

## 2013-12-17 DIAGNOSIS — IMO0001 Reserved for inherently not codable concepts without codable children: Secondary | ICD-10-CM

## 2013-12-17 DIAGNOSIS — M25519 Pain in unspecified shoulder: Secondary | ICD-10-CM

## 2013-12-17 DIAGNOSIS — M25512 Pain in left shoulder: Secondary | ICD-10-CM

## 2013-12-17 NOTE — Progress Notes (Signed)
No chief complaint on file.   HPI:  Acute visit for:  1) Shoulder sprain: -started 1 week ago after applying suprapubic pressure during delivery -L shoulder and upper arm sore immediately following -Motrin and Tylenol help -can use this arm ok - but hurts especially after activities involving abduction of the shoulder -denies: popping of shoulder, hx of dislocation, weakness, numbness   2)Elevated Blood Pressure: -reports has been borderline in the past -denies: CP, SOB, swelling  ROS: See pertinent positives and negatives per HPI.  No past medical history on file.  No past surgical history on file.  No family history on file.  History   Social History  . Marital Status: Married    Spouse Name: N/A    Number of Children: N/A  . Years of Education: N/A   Social History Main Topics  . Smoking status: Former Smoker -- 0.50 packs/day for 4 years    Types: Cigarettes    Quit date: 07/12/2004  . Smokeless tobacco: None  . Alcohol Use: None  . Drug Use: None  . Sexual Activity: None   Other Topics Concern  . None   Social History Narrative  . None    Current outpatient prescriptions:albuterol (PROVENTIL HFA) 108 (90 BASE) MCG/ACT inhaler, Inhale 2 puffs into the lungs every 6 (six) hours as needed., Disp: 1 Inhaler, Rfl: 1;  budesonide-formoterol (SYMBICORT) 160-4.5 MCG/ACT inhaler, Inhale 2 puffs into the lungs 2 (two) times daily., Disp: 3 Inhaler, Rfl: 3;  Levalbuterol HCl (XOPENEX IN), Inhale into the lungs., Disp: , Rfl:  levothyroxine (SYNTHROID, LEVOTHROID) 112 MCG tablet, Take 1 tablet (112 mcg total) by mouth daily., Disp: 100 tablet, Rfl: 3;  loratadine (CLARITIN) 10 MG tablet, Take 10 mg by mouth daily., Disp: , Rfl: ;  Multiple Vitamin (MULTIVITAMIN) tablet, Take 1 tablet by mouth daily., Disp: , Rfl: ;  norethindrone-ethinyl estradiol (JINTELI) 1-5 MG-MCG TABS, Take 1 tablet by mouth daily., Disp: , Rfl:  ranitidine (ZANTAC) 150 MG tablet, Take 150 mg by  mouth 2 (two) times daily., Disp: , Rfl: ;  zolpidem (AMBIEN) 5 MG tablet, One half tablet each bedtime when necessary Ambien 10 mg last filled by Dr Garwin Brothers 02/28/2013, Disp: , Rfl:   EXAM:  Filed Vitals:   12/17/13 0803  BP: 142/98  Pulse: 80  Temp: 98.5 F (36.9 C)    Body mass index is 35.44 kg/(m^2).  GENERAL: vitals reviewed and listed above, alert, oriented, appears well hydrated and in no acute distress  HEENT: atraumatic, conjunttiva clear, no obvious abnormalities on inspection of external nose and ears  NECK: no obvious masses on inspection  LUNGS: clear to auscultation bilaterally, no wheezes, rales or rhonchi, good air movement  CV: HRRR, no peripheral edema  MS/NEURO: moves all extremities without noticeable abnormality -normal inspection of shoulders, upper back, arms and neck -normal ROM and muscle strength in UEs and neck bilateral, normal sensation to light touch bilat in upper exts -TTP in L supraspinatus attach to humerus and trap muscles with muscle spasm and TTP -neg impingement test, neg neers, neg speeds, neg empty can, neg shawl sign, neg apprehension test  PSYCH: pleasant and cooperative, no obvious depression or anxiety  ASSESSMENT AND PLAN:  Discussed the following assessment and plan:  Left shoulder pain - suspect mild L RTC injury and trapezius muscle strain - HEP, conservative tx, work not offered but she declined, follow up in 4 weeks  Elevated blood pressure -discussed options -she decided to work on lifestyle changes with  close follow up  -Patient advised to return or notify a doctor immediately if symptoms worsen or persist or new concerns arise.  Patient Instructions  FOR the BLOOD PRESSURE:  -watch sodium in diet and get at least 150 minutes of CV exercise weekly  FOR the SHOULDER: -heat for 15 minutes twice dialy -exercises provided at least 4 days per week -naproxen or tylenol as instructed - try to limit the naproxen if  possible  FOLLOW on if 4 weeks       Susan Burch R.

## 2013-12-17 NOTE — Progress Notes (Signed)
Pre visit review using our clinic review tool, if applicable. No additional management support is needed unless otherwise documented below in the visit note. 

## 2013-12-17 NOTE — Patient Instructions (Signed)
FOR the BLOOD PRESSURE:  -watch sodium in diet and get at least 150 minutes of CV exercise weekly  FOR the SHOULDER: -heat for 15 minutes twice dialy -exercises provided at least 4 days per week -naproxen or tylenol as instructed - try to limit the naproxen if possible  FOLLOW on if 4 weeks

## 2014-01-21 ENCOUNTER — Ambulatory Visit: Payer: 59 | Admitting: Family Medicine

## 2014-02-05 ENCOUNTER — Telehealth: Payer: Self-pay | Admitting: Family Medicine

## 2014-02-05 DIAGNOSIS — E039 Hypothyroidism, unspecified: Secondary | ICD-10-CM

## 2014-02-05 NOTE — Telephone Encounter (Signed)
Pt saw dr cousins and wants to know if you received a fax from their office about her issue.

## 2014-02-06 MED ORDER — LEVOTHYROXINE SODIUM 125 MCG PO TABS: 125.0000 ug | ORAL_TABLET | Freq: Every day | ORAL | Status: DC

## 2014-02-06 NOTE — Telephone Encounter (Signed)
Spoke with patient. Lab appointment made, lab ordered med sent.

## 2014-02-06 NOTE — Telephone Encounter (Signed)
Left message on machine for patient to return our call.  Lab results were received

## 2014-03-18 ENCOUNTER — Other Ambulatory Visit (INDEPENDENT_AMBULATORY_CARE_PROVIDER_SITE_OTHER): Payer: 59

## 2014-03-18 DIAGNOSIS — E039 Hypothyroidism, unspecified: Secondary | ICD-10-CM

## 2014-03-18 LAB — TSH: TSH: 1.35 u[IU]/mL (ref 0.35–4.50)

## 2014-06-03 ENCOUNTER — Ambulatory Visit (INDEPENDENT_AMBULATORY_CARE_PROVIDER_SITE_OTHER): Payer: 59 | Admitting: Family Medicine

## 2014-06-03 ENCOUNTER — Encounter: Payer: Self-pay | Admitting: Family Medicine

## 2014-06-03 VITALS — BP 120/84 | Temp 98.2°F | Wt 205.0 lb

## 2014-06-03 DIAGNOSIS — G4700 Insomnia, unspecified: Secondary | ICD-10-CM

## 2014-06-03 DIAGNOSIS — N959 Unspecified menopausal and perimenopausal disorder: Secondary | ICD-10-CM

## 2014-06-03 DIAGNOSIS — E038 Other specified hypothyroidism: Secondary | ICD-10-CM

## 2014-06-03 DIAGNOSIS — N951 Menopausal and female climacteric states: Secondary | ICD-10-CM

## 2014-06-03 DIAGNOSIS — J4520 Mild intermittent asthma, uncomplicated: Secondary | ICD-10-CM

## 2014-06-03 MED ORDER — ZOLPIDEM TARTRATE 5 MG PO TABS: ORAL_TABLET | ORAL | Status: DC

## 2014-06-03 NOTE — Progress Notes (Signed)
Pre visit review using our clinic review tool, if applicable. No additional management support is needed unless otherwise documented below in the visit note. 

## 2014-06-03 NOTE — Progress Notes (Signed)
   Subjective:    Patient ID: Susan Burch, female    DOB: 1955/05/28, 59 y.o.   MRN: [id number]  HPI Comfort is a 59 year old married female nonsmoker,,,,,,,, NICU nurse,,,, who comes in today to discuss a number of issues  She takes Synthroid 125 g. Her dose was increased last fall by her gynecologist Dr. cousins because her TSH was 10. Follow-up TSH normal therefore continue 125 g daily  She takes Symbicort 2 puffs twice a day for chronic asthma and Claritin for allergic rhinitis she also takes Zantac 150 mg twice a day for reflux  She saw her gynecologist last fall she only gave her a couple of months of HRT. Basically she's been off her HRT for couple months and insomnia his back. She also wanted a refill of her Ambien but her gynecologist declined and wanted [place] to do it. Ambien was actually started by her gynecologist not [place].  She brings in a complete laboratory panel all of which is within normal limits. There are couple minor abnormalities but are not significant. Therefore there is no reason that she cannot be on HRT pending a discussion with she and her gynecologist about the pluses and minuses. But I can see no lab issue that would negate that discussion   Review of Systems Review of systems otherwise negative    Objective:   Physical Exam  Well-developed well-nourished female no acute distress vital signs stable she's afebrile  It 15 minutes reviewing all her lab work which was normal. There is some minor abnormalities but they're not statistically significant      Assessment & Plan:  Healthy female  Asthma,,,,, continue current medications of allergic rhinitis,,,,,,,,,,,, continue current medications  Insomnia secondary to menopause,,,,,,, refill Ambien 5 mg one half tab daily at bedtime when necessary discuss HRT with GYN

## 2014-06-03 NOTE — Patient Instructions (Signed)
In reviewing your lab work you had a tear gynecologist office last fall there are some minor things but they're not significant. Basically a lab work is normal.  Ambien 5 mg....... one half tab daily at bedtime when necessary  Discuss with your gynecologist reinstituting the HRT  If pulmonary wise she feels stable then decrease the inhaler to 1 puff twice daily

## 2014-07-24 ENCOUNTER — Ambulatory Visit (INDEPENDENT_AMBULATORY_CARE_PROVIDER_SITE_OTHER): Payer: 59 | Admitting: Family Medicine

## 2014-07-24 ENCOUNTER — Encounter: Payer: Self-pay | Admitting: Family Medicine

## 2014-07-24 ENCOUNTER — Other Ambulatory Visit: Payer: Self-pay | Admitting: *Deleted

## 2014-07-24 VITALS — BP 120/84 | HR 80 | Temp 98.2°F | Wt 208.0 lb

## 2014-07-24 DIAGNOSIS — M5489 Other dorsalgia: Secondary | ICD-10-CM | POA: Diagnosis not present

## 2014-07-24 MED ORDER — ZOLPIDEM TARTRATE 5 MG PO TABS
5.0000 mg | ORAL_TABLET | Freq: Every evening | ORAL | Status: DC | PRN
Start: 2014-07-24 — End: 2015-02-02

## 2014-07-24 MED ORDER — CYCLOBENZAPRINE HCL 5 MG PO TABS: 5.0000 mg | ORAL_TABLET | Freq: Three times a day (TID) | ORAL | Status: DC | PRN

## 2014-07-24 NOTE — Patient Instructions (Signed)
BEFORE YOU LEAVE: -low back exercises -follow up in 1 month  Do the back exercises 4 days per week  Heat for 15 minutes twice daily  Tylenol 500-1000mg  up to 3 times per day if needed for the pain  Flexeril 5 mg once nightly for 5-7 days    FOR IMPROVED SLEEP AND TO RESET YOUR SLEEP SCHEDULE: []  exercise 30 minutes daily  []  go to bed and wake up at the same time  []  keep bedroom cool, dark and quiet  []  reserve bed for sleep - do not read, watch TV, etc in bed  []  If you toss and turn more then 15-20 minutes get out of bed and list thoughts/do quite activity then go back to bed; repeat as needed; do not worry about when you eventually fall asleep - still get up at the same time and turn on lights and take shower  [] get counseling  []  some people find that a half dose of benadryl, melatonin, tylenol pm or unisom on a few nights per week is helpful initially for a few weeks  [] seek help and treat any depression or anxiety  [] prescription strength sleep medications should only be used in severe cases of insomnia if other measures fail and should be used sparingly

## 2014-07-24 NOTE — Progress Notes (Signed)
HPI:  R low back pain: -started about 6-8 weeks ago -she can't think of a specific trigger, trauma or injury she can think of -pain is intermittent, located in R low back in into R buttock, sharp, pain level in 4/16 now, occ with certain activities is a 5-6/10 -motrin helps the pain -denies: weakness, numbness, bowel or bladder dysfunction, malaise, fevers  ROS: See pertinent positives and negatives per HPI.  No past medical history on file.  No past surgical history on file.  No family history on file.  History   Social History  . Marital Status: Married    Spouse Name: N/A  . Number of Children: N/A  . Years of Education: N/A   Social History Main Topics  . Smoking status: Former Smoker -- 0.50 packs/day for 4 years    Types: Cigarettes    Quit date: 07/12/2004  . Smokeless tobacco: Not on file  . Alcohol Use: Not on file  . Drug Use: Not on file  . Sexual Activity: Not on file   Other Topics Concern  . None   Social History Narrative     Current outpatient prescriptions:  .  albuterol (PROVENTIL HFA) 108 (90 BASE) MCG/ACT inhaler, Inhale 2 puffs into the lungs every 6 (six) hours as needed., Disp: 1 Inhaler, Rfl: 1 .  Biotin 10 MG TABS, Take by mouth., Disp: , Rfl:  .  budesonide-formoterol (SYMBICORT) 160-4.5 MCG/ACT inhaler, Inhale 2 puffs into the lungs 2 (two) times daily., Disp: 3 Inhaler, Rfl: 3 .  Levalbuterol HCl (XOPENEX IN), Inhale into the lungs., Disp: , Rfl:  .  levothyroxine (SYNTHROID, LEVOTHROID) 125 MCG tablet, Take 1 tablet (125 mcg total) by mouth daily., Disp: 90 tablet, Rfl: 3 .  loratadine (CLARITIN) 10 MG tablet, Take 10 mg by mouth daily., Disp: , Rfl:  .  Multiple Vitamin (MULTIVITAMIN) tablet, Take 1 tablet by mouth daily., Disp: , Rfl:  .  norethindrone-ethinyl estradiol (JINTELI) 1-5 MG-MCG TABS, Take 1 tablet by mouth daily., Disp: , Rfl:  .  ranitidine (ZANTAC) 150 MG tablet, Take 150 mg by mouth 2 (two) times daily., Disp: , Rfl:   .  zolpidem (AMBIEN) 5 MG tablet, One half tablet each bedtime when necessary, Disp: 60 tablet, Rfl: 5 .  cyclobenzaprine (FLEXERIL) 5 MG tablet, Take 1 tablet (5 mg total) by mouth 3 (three) times daily as needed for muscle spasms., Disp: 20 tablet, Rfl: 0  EXAM:  Filed Vitals:   07/24/14 1454  BP: 120/84  Pulse: 80  Temp: 98.2 F (36.8 C)    Body mass index is 36.85 kg/(m^2).  GENERAL: vitals reviewed and listed above, alert, oriented, appears well hydrated and in no acute distress  HEENT: atraumatic, conjunttiva clear, no obvious abnormalities on inspection of external nose and ears  NECK: no obvious masses on inspection  LUNGS: clear to auscultation bilaterally, no wheezes, rales or rhonchi, good air movement  CV: HRRR, no peripheral edema  MS: moves all extremities without noticeable abnormality Normal Gait Normal inspection of back, no obvious scoliosis or leg length descrepancy No bony TTP Soft tissue TTP at: R PSIS and over piriformis muscles R -/+ tests: neg trendelenburg,-facet loading, -SLRT, -CLRT, +FABER R, pain in R buttock with FADIR Normal muscle strength, sensation to light touch and DTRs in LEs bilaterally  PSYCH: pleasant and cooperative, no obvious depression or anxiety  ASSESSMENT AND PLAN:  Discussed the following assessment and plan:  Right-sided back pain, unspecified location - Plan: cyclobenzaprine (FLEXERIL) 5  MG tablet  -suspect mild OA versus, sacroiliitis or piriformis syndrom or combination as etiology -opted for conservative measures with HEP, muscle relaxer, analgesic and close follow up -she may transfer care to me, however I advised I do not recommend long term use of sleep aides and offer and she may stick with current PCP -Patient advised to return or notify a doctor immediately if symptoms worsen or persist or new concerns arise.  Patient Instructions  BEFORE YOU LEAVE: -low back exercises -follow up in 1 month  Do the back  exercises 4 days per week  Heat for 15 minutes twice daily  Tylenol 500-1000mg  up to 3 times per day if needed for the pain  Flexeril 5 mg once nightly for 5-7 days    FOR IMPROVED SLEEP AND TO RESET YOUR SLEEP SCHEDULE: []  exercise 30 minutes daily  []  go to bed and wake up at the same time  []  keep bedroom cool, dark and quiet  []  reserve bed for sleep - do not read, watch TV, etc in bed  []  If you toss and turn more then 15-20 minutes get out of bed and list thoughts/do quite activity then go back to bed; repeat as needed; do not worry about when you eventually fall asleep - still get up at the same time and turn on lights and take shower  [] get counseling  []  some people find that a half dose of benadryl, melatonin, tylenol pm or unisom on a few nights per week is helpful initially for a few weeks  [] seek help and treat any depression or anxiety  [] prescription strength sleep medications should only be used in severe cases of insomnia if other measures fail and should be used sparingly        KIM, Jarrett Soho R.

## 2014-08-11 ENCOUNTER — Ambulatory Visit (INDEPENDENT_AMBULATORY_CARE_PROVIDER_SITE_OTHER): Payer: 59 | Admitting: Family Medicine

## 2014-08-11 ENCOUNTER — Encounter: Payer: Self-pay | Admitting: Family Medicine

## 2014-08-11 VITALS — BP 132/88 | HR 88 | Ht 63.0 in | Wt 205.0 lb

## 2014-08-11 DIAGNOSIS — M9904 Segmental and somatic dysfunction of sacral region: Secondary | ICD-10-CM | POA: Diagnosis not present

## 2014-08-11 DIAGNOSIS — M9902 Segmental and somatic dysfunction of thoracic region: Secondary | ICD-10-CM | POA: Diagnosis not present

## 2014-08-11 DIAGNOSIS — M533 Sacrococcygeal disorders, not elsewhere classified: Secondary | ICD-10-CM | POA: Diagnosis not present

## 2014-08-11 DIAGNOSIS — M999 Biomechanical lesion, unspecified: Secondary | ICD-10-CM | POA: Insufficient documentation

## 2014-08-11 DIAGNOSIS — M9903 Segmental and somatic dysfunction of lumbar region: Secondary | ICD-10-CM | POA: Diagnosis not present

## 2014-08-11 MED ORDER — DICLOFENAC SODIUM 2 % TD SOLN: TRANSDERMAL | Status: DC

## 2014-08-11 NOTE — Assessment & Plan Note (Signed)
Decision today to treat with OMT was based on Physical Exam  After verbal consent patient was treated with HVLA, ME techniques in crevical, thoracic, and lumbar areas  Patient tolerated the procedure well with improvement in symptoms  Patient given exercises, stretches and lifestyle modifications  See medications in patient instructions if given  Patient will follow up in 3 weeks

## 2014-08-11 NOTE — Progress Notes (Signed)
[name] [address] [place], [place] [address] Phone: [phone] Subjective:    I'm seeing this patient by the request  of:  TODD,JEFFREY ALLEN, MD   CC: Low back pain  [id number] Susan Burch is a 59 y.o. female coming in with complaint of low back pain. Mostly on the right side. States that this occurs intermittently. Sometimes seems to be worse after a lot of walking. Patient states it is always on the right side and seems to radiate down her right leg sometimes. Patient denies that it never goes past her knee. Patient states that it can stop her from some activity but this is very seldomly. Patient states it does respond to anti-inflammatories when needed. Seems to be more frequent recently. Rates the severity of 5 out of 10. States that sometimes it can be uncomfortable at night but does not wake her up at night. Denies any weakness of the lower extremity.    History reviewed. No pertinent past medical history. History reviewed. No pertinent past surgical history. History reviewed. No pertinent family history. History   Social History  . Marital Status: Married    Spouse Name: N/A  . Number of Children: N/A  . Years of Education: N/A   Occupational History  . Not on file.   Social History Main Topics  . Smoking status: Former Smoker -- 0.50 packs/day for 4 years    Types: Cigarettes    Quit date: 07/12/2004  . Smokeless tobacco: Not on file  . Alcohol Use: Not on file  . Drug Use: Not on file  . Sexual Activity: Not on file   Other Topics Concern  . Not on file   Social History Narrative   Allergies  Allergen Reactions  . Bactrim   . Penicillins     Past medical history, social, surgical and family history all reviewed in electronic medical record.   Review of Systems: No headache, visual changes, nausea, vomiting, diarrhea, constipation, dizziness, abdominal pain, skin rash, fevers, chills, night sweats, weight loss,  swollen lymph nodes, body aches, joint swelling, muscle aches, chest pain, shortness of breath, mood changes.   Objective Blood pressure 132/88, pulse 88, height 5\' 3"  (1.6 m), weight 205 lb (92.987 kg), SpO2 98 %.  General: No apparent distress alert and oriented x3 mood and affect normal, dressed appropriately.  HEENT: Pupils equal, extraocular movements intact  Respiratory: Patient's speak in full sentences and does not appear short of breath  Cardiovascular: No lower extremity edema, non tender, no erythema  Skin: Warm dry intact with no signs of infection or rash on extremities or on axial skeleton.  Abdomen: Soft nontender  Neuro: Cranial nerves II through XII are intact, neurovascularly intact in all extremities with 2+ DTRs and 2+ pulses.  Lymph: No lymphadenopathy of posterior or anterior cervical chain or axillae bilaterally.  Gait normal with good balance and coordination.  MSK:  Non tender with full range of motion and good stability and symmetric strength and tone of shoulders, elbows, wrist, hip, knee and ankles bilaterally.  Back Exam:  Inspection: Unremarkable for core strength Motion: Flexion 35 deg, Extension 45 deg, Side Bending to 45 deg bilaterally,  Rotation to 45 deg bilaterally  SLR laying: Negative  XSLR laying: Negative  Palpable tenderness: Tender over right sacroiliac joint FABER: Positive right. Sensory change: Gross sensation intact to all lumbar and sacral dermatomes.  Reflexes: 2+ at both patellar tendons, 2+ at achilles tendons, Babinski's downgoing.  Strength  at foot  Plantar-flexion: 5/5 Dorsi-flexion: 5/5 Eversion: 5/5 Inversion: 5/5  Leg strength  Quad: 5/5 Hamstring: 5/5 Hip flexor: 5/5 Hip abductors: 4/5 but symmetric Gait unremarkable.  Osteopathic findings  Standing flexion  right  Seated Flexion Right  Cervical  C2 flexed rotated and side bent right  Thoracic T5 extended rotated and side bent left  Lumbar L2 flexed rotated and  side bent right  Sacrum Left on left  Illium Neutral       Impression and Recommendations:     This case required medical decision making of moderate complexity.

## 2014-08-11 NOTE — Patient Instructions (Signed)
Good to see you.  Ice 20 minutes 2 times daily. Usually after activity and before bed. Exercises 3 times a week.  Vitamin D 2000 IU daily Turmeric 500mg  twice daily Sacroiliac Joint Mobilization and Rehab 1. Work on pretzel stretching, shoulder back and leg draped in front. 3-5 sets, 30 sec.. 2. hip abductor rotations. standing, hip flexion and rotation outward then inward. 3 sets, 15 reps. when can do comfortably, add ankle weights starting at 2 pounds.  3. cross over stretching - shoulder back to ground, same side leg crossover. 3-5 sets for 30 min..  4. rolling up and back knees to chest and rocking. 5. sacral tilt - 5 sets, hold for 5-10 seconds Exercises on wall.  Heel and butt touching.  Raise leg 6 inches and hold 2 seconds.  Down slow for count of 4 seconds.  1 set of 30 reps daily on both sides.  See me again in 3 weeks

## 2014-08-11 NOTE — Assessment & Plan Note (Signed)
Responded to OMT today.  Discuss HEP, icing and ROM exercises.  Discussed OTC and given RX for topical NSAIDs  RTC in 3 weeks.

## 2014-08-11 NOTE — Progress Notes (Signed)
Pre visit review using our clinic review tool, if applicable. No additional management support is needed unless otherwise documented below in the visit note. 

## 2014-09-03 ENCOUNTER — Encounter: Payer: Self-pay | Admitting: Family Medicine

## 2014-09-03 ENCOUNTER — Ambulatory Visit (INDEPENDENT_AMBULATORY_CARE_PROVIDER_SITE_OTHER): Payer: 59 | Admitting: Family Medicine

## 2014-09-03 VITALS — BP 106/72 | HR 91 | Ht 63.0 in | Wt 208.0 lb

## 2014-09-03 DIAGNOSIS — M9903 Segmental and somatic dysfunction of lumbar region: Secondary | ICD-10-CM | POA: Diagnosis not present

## 2014-09-03 DIAGNOSIS — M9904 Segmental and somatic dysfunction of sacral region: Secondary | ICD-10-CM | POA: Diagnosis not present

## 2014-09-03 DIAGNOSIS — M999 Biomechanical lesion, unspecified: Secondary | ICD-10-CM

## 2014-09-03 DIAGNOSIS — M9902 Segmental and somatic dysfunction of thoracic region: Secondary | ICD-10-CM | POA: Diagnosis not present

## 2014-09-03 DIAGNOSIS — M533 Sacrococcygeal disorders, not elsewhere classified: Secondary | ICD-10-CM

## 2014-09-03 NOTE — Progress Notes (Signed)
[name] [address] [place], [place] [address] Phone: [phone] Subjective:     CC: Low back pain follow up  [id number] Susan Burch is a 59 y.o. female coming in with complaint of low back pain. Patient was found to have more of a sacroiliac joint dysfunction. Patient did have osteopathic manipulation was given home exercises. Patient states that she needed her muscle relaxer one time but since then she has been 95% better. Patient states that his been only a very mild dull ache on the lower aspect on the right side but nothing that his stopping her from activities. Patient is very happy with the results.    No past medical history on file. No past surgical history on file. No family history on file. History   Social History  . Marital Status: Married    Spouse Name: N/A  . Number of Children: N/A  . Years of Education: N/A   Occupational History  . Not on file.   Social History Main Topics  . Smoking status: Former Smoker -- 0.50 packs/day for 4 years    Types: Cigarettes    Quit date: 07/12/2004  . Smokeless tobacco: Not on file  . Alcohol Use: Not on file  . Drug Use: Not on file  . Sexual Activity: Not on file   Other Topics Concern  . Not on file   Social History Narrative   Allergies  Allergen Reactions  . Bactrim   . Penicillins     Past medical history, social, surgical and family history all reviewed in electronic medical record.   Review of Systems: No headache, visual changes, nausea, vomiting, diarrhea, constipation, dizziness, abdominal pain, skin rash, fevers, chills, night sweats, weight loss, swollen lymph nodes, body aches, joint swelling, muscle aches, chest pain, shortness of breath, mood changes.   Objective Blood pressure 106/72, pulse 91, height 5\' 3"  (1.6 m), weight 208 lb (94.348 kg), SpO2 98 %.  General: No apparent distress alert and oriented x3 mood and affect normal, dressed appropriately.    HEENT: Pupils equal, extraocular movements intact  Respiratory: Patient's speak in full sentences and does not appear short of breath  Cardiovascular: No lower extremity edema, non tender, no erythema  Skin: Warm dry intact with no signs of infection or rash on extremities or on axial skeleton.  Abdomen: Soft nontender  Neuro: Cranial nerves II through XII are intact, neurovascularly intact in all extremities with 2+ DTRs and 2+ pulses.  Lymph: No lymphadenopathy of posterior or anterior cervical chain or axillae bilaterally.  Gait normal with good balance and coordination.  MSK:  Non tender with full range of motion and good stability and symmetric strength and tone of shoulders, elbows, wrist, hip, knee and ankles bilaterally.  Back Exam:  Inspection: Unremarkable for core strength Motion: Flexion 35 deg, Extension 45 deg, Side Bending to 45 deg bilaterally,  Rotation to 45 deg bilaterally  SLR laying: Negative  XSLR laying: Negative  Palpable tenderness: Tender over right sacroiliac joint FABER: Positive right. Sensory change: Gross sensation intact to all lumbar and sacral dermatomes.  Reflexes: 2+ at both patellar tendons, 2+ at achilles tendons, Babinski's downgoing.  Strength at foot  Plantar-flexion: 5/5 Dorsi-flexion: 5/5 Eversion: 5/5 Inversion: 5/5  Leg strength  Quad: 5/5 Hamstring: 5/5 Hip flexor: 5/5 Hip abductors: 4/5 but symmetric Gait unremarkable.  Osteopathic findings  Cervical  C2 flexed rotated and side bent right  Thoracic T5 extended rotated and side bent left  Lumbar L2 flexed rotated and side bent right  Sacrum Left on left Same pattern as previously    Impression and Recommendations:     This case required medical decision making of moderate complexity.

## 2014-09-03 NOTE — Patient Instructions (Signed)
Good to see you You are doing great Continue what you are doing See me again in 4-6 weeks if needed  Otherwise see me when you need me.

## 2014-09-03 NOTE — Progress Notes (Signed)
Pre visit review using our clinic review tool, if applicable. No additional management support is needed unless otherwise documented below in the visit note. 

## 2014-09-03 NOTE — Assessment & Plan Note (Signed)
Decision today to treat with OMT was based on Physical Exam  After verbal consent patient was treated with HVLA, ME techniques in crevical, thoracic, and lumbar areas  Patient tolerated the procedure well with improvement in symptoms  Patient given exercises, stretches and lifestyle modifications  See medications in patient instructions if given  Patient will follow up in 4-6 weeks

## 2014-09-03 NOTE — Assessment & Plan Note (Signed)
Patient overall is doing significantly better. Encourage patient to continue the home exercises in the icing protocol. Patient will work on hip abductor strengthening. Patient will see me again in 4-6 weeks for further evaluation and treatment.

## 2014-09-15 ENCOUNTER — Other Ambulatory Visit: Payer: Self-pay | Admitting: Family Medicine

## 2014-10-01 ENCOUNTER — Ambulatory Visit: Payer: 59 | Admitting: Family Medicine

## 2014-11-19 ENCOUNTER — Ambulatory Visit (INDEPENDENT_AMBULATORY_CARE_PROVIDER_SITE_OTHER): Payer: 59 | Admitting: Family Medicine

## 2014-11-19 ENCOUNTER — Encounter: Payer: Self-pay | Admitting: Family Medicine

## 2014-11-19 VITALS — BP 122/84 | HR 86 | Ht 63.0 in | Wt 207.0 lb

## 2014-11-19 DIAGNOSIS — M9904 Segmental and somatic dysfunction of sacral region: Secondary | ICD-10-CM

## 2014-11-19 DIAGNOSIS — M9903 Segmental and somatic dysfunction of lumbar region: Secondary | ICD-10-CM | POA: Diagnosis not present

## 2014-11-19 DIAGNOSIS — M9902 Segmental and somatic dysfunction of thoracic region: Secondary | ICD-10-CM

## 2014-11-19 DIAGNOSIS — S76011A Strain of muscle, fascia and tendon of right hip, initial encounter: Secondary | ICD-10-CM

## 2014-11-19 DIAGNOSIS — M999 Biomechanical lesion, unspecified: Secondary | ICD-10-CM

## 2014-11-19 DIAGNOSIS — S76311A Strain of muscle, fascia and tendon of the posterior muscle group at thigh level, right thigh, initial encounter: Secondary | ICD-10-CM

## 2014-11-19 MED ORDER — MELOXICAM 15 MG PO TABS: 15.0000 mg | ORAL_TABLET | Freq: Every day | ORAL | Status: DC

## 2014-11-19 NOTE — Patient Instructions (Addendum)
Good to see you New exercises 3 times a week Ice 20 minutes 2 times daily. Usually after activity and before bed. Meloxicam 15mg  daily for 10 days then as needed See me again in 2-3 weeks.

## 2014-11-19 NOTE — Assessment & Plan Note (Signed)
Decision today to treat with OMT was based on Physical Exam  After verbal consent patient was treated with HVLA, ME techniques in crevical, thoracic, and lumbar areas  Patient tolerated the procedure well with improvement in symptoms  Patient given exercises, stretches and lifestyle modifications  See medications in patient instructions if given  Patient will follow up in 2-3 weeks

## 2014-11-19 NOTE — Assessment & Plan Note (Signed)
Different than previous exam. Patient is having more pain over the gluteal muscle. I think the patient has more of a muscle strain. Home exercises given, icing protocol, and patient will do anti-implant Mentor he daily for the next 10 days. We discussed when to seek medical attention if any radicular symptoms occur. Patient will come back though in 2-3 weeks for further evaluation and treatment.

## 2014-11-19 NOTE — Progress Notes (Signed)
Pre visit review using our clinic review tool, if applicable. No additional management support is needed unless otherwise documented below in the visit note. 

## 2014-11-19 NOTE — Progress Notes (Signed)
[name] [place], [place] [address] Phone: [phone] Subjective:     CC: Low back pain follow up  [id number] Susan Burch is a 59 y.o. female coming in with complaint of low back pain. Patient was found to have more of a sacroiliac joint dysfunction. Patient was doing much better but then she was washing her car and tripped. Patient was able to get resolve but had a popping sensation in the lower back. Patient states it is right side and right near the sacroiliac joint. States that at the end of a long day can be very tender. Patient states going up and downstairs seems to be worse than usual. Seems to be more in the buttocks. No radicular symptoms. Patient is sleeping comfortably.    No past medical history on file. No past surgical history on file. No family history on file. History   Social History  . Marital Status: Married    Spouse Name: N/A  . Number of Children: N/A  . Years of Education: N/A   Occupational History  . Not on file.   Social History Main Topics  . Smoking status: Former Smoker -- 0.50 packs/day for 4 years    Types: Cigarettes    Quit date: 07/12/2004  . Smokeless tobacco: Not on file  . Alcohol Use: Not on file  . Drug Use: Not on file  . Sexual Activity: Not on file   Other Topics Concern  . Not on file   Social History Narrative   Allergies  Allergen Reactions  . Bactrim   . Penicillins     Past medical history, social, surgical and family history all reviewed in electronic medical record.   Review of Systems: No headache, visual changes, nausea, vomiting, diarrhea, constipation, dizziness, abdominal pain, skin rash, fevers, chills, night sweats, weight loss, swollen lymph nodes, body aches, joint swelling, muscle aches, chest pain, shortness of breath, mood changes.   Objective Blood pressure 122/84, pulse 86, height 5\' 3"  (1.6 m), weight 207 lb (93.895 kg), SpO2 99 %.  General: No  apparent distress alert and oriented x3 mood and affect normal, dressed appropriately.  HEENT: Pupils equal, extraocular movements intact  Respiratory: Patient's speak in full sentences and does not appear short of breath  Cardiovascular: No lower extremity edema, non tender, no erythema  Skin: Warm dry intact with no signs of infection or rash on extremities or on axial skeleton.  Abdomen: Soft nontender  Neuro: Cranial nerves II through XII are intact, neurovascularly intact in all extremities with 2+ DTRs and 2+ pulses.  Lymph: No lymphadenopathy of posterior or anterior cervical chain or axillae bilaterally.  Gait normal with good balance and coordination.  MSK:  Non tender with full range of motion and good stability and symmetric strength and tone of shoulders, elbows, wrist, hip, knee and ankles bilaterally.  Back Exam:  Inspection: Unremarkable  Motion: Flexion 35 deg, Extension 45 deg, Side Bending to 45 deg bilaterally,  Rotation to 45 deg bilaterally  SLR laying: Negative  XSLR laying: Negative  Palpable tenderness: Tender over right sacroiliac joint and more over the right gluteal muscle FABER: Positive right. Sensory change: Gross sensation intact to all lumbar and sacral dermatomes.  Reflexes: 2+ at both patellar tendons, 2+ at achilles tendons, Babinski's downgoing.  Strength at foot  Plantar-flexion: 5/5 Dorsi-flexion: 5/5 Eversion: 5/5 Inversion: 5/5  Leg strength  Quad: 5/5 Hamstring: 5/5 Hip flexor: 5/5 Hip abductors: 4/5 but symmetric Gait  unremarkable.  Osteopathic findings  Cervical  C2 flexed rotated and side bent right  Thoracic T5 extended rotated and side bent left  Lumbar L2 flexed rotated and side bent right  Sacrum Left on left Same pattern as previously    Impression and Recommendations:     This case required medical decision making of moderate complexity.

## 2014-12-10 ENCOUNTER — Ambulatory Visit: Payer: 59 | Admitting: Family Medicine

## 2014-12-10 ENCOUNTER — Encounter: Payer: Self-pay | Admitting: Family Medicine

## 2014-12-10 ENCOUNTER — Ambulatory Visit (INDEPENDENT_AMBULATORY_CARE_PROVIDER_SITE_OTHER): Payer: 59 | Admitting: Family Medicine

## 2014-12-10 VITALS — BP 124/80 | HR 78 | Wt 208.0 lb

## 2014-12-10 DIAGNOSIS — M999 Biomechanical lesion, unspecified: Secondary | ICD-10-CM

## 2014-12-10 DIAGNOSIS — M9904 Segmental and somatic dysfunction of sacral region: Secondary | ICD-10-CM

## 2014-12-10 DIAGNOSIS — M9902 Segmental and somatic dysfunction of thoracic region: Secondary | ICD-10-CM | POA: Diagnosis not present

## 2014-12-10 DIAGNOSIS — M9903 Segmental and somatic dysfunction of lumbar region: Secondary | ICD-10-CM | POA: Diagnosis not present

## 2014-12-10 DIAGNOSIS — M533 Sacrococcygeal disorders, not elsewhere classified: Secondary | ICD-10-CM

## 2014-12-10 NOTE — Assessment & Plan Note (Signed)
Patient is doing significantly better at this time. Encourage her to continue to increase her activities as tolerated. Nothing that his stopping her from any activities. Continue same plan RTC in 8 weeks.

## 2014-12-10 NOTE — Patient Instructions (Addendum)
Good to see you Ice when you need it Continue the exercises when you need it.  See me again in 6-8 weeks.

## 2014-12-10 NOTE — Progress Notes (Signed)
[name] [place], [place] [address] Phone: [phone] Subjective:     CC: Low back pain follow up  [id number] Susan Burch is a 59 y.o. female coming in with complaint of low back pain. Patient was found to have more of a sacroiliac joint dysfunction. Patient is felt fantastic. Mild discomfort from time to time but nothing that stops her from any activities. States activities. Hasn't been able to do work without any significant difficulty. Denies any radiation of pain or anything alarming.    History reviewed. No pertinent past medical history. History reviewed. No pertinent past surgical history. History reviewed. No pertinent family history. Social History   Social History  . Marital Status: Married    Spouse Name: N/A  . Number of Children: N/A  . Years of Education: N/A   Occupational History  . Not on file.   Social History Main Topics  . Smoking status: Former Smoker -- 0.50 packs/day for 4 years    Types: Cigarettes    Quit date: 07/12/2004  . Smokeless tobacco: Not on file  . Alcohol Use: Not on file  . Drug Use: Not on file  . Sexual Activity: Not on file   Other Topics Concern  . Not on file   Social History Narrative   Allergies  Allergen Reactions  . Bactrim   . Penicillins     Past medical history, social, surgical and family history all reviewed in electronic medical record.   Review of Systems: No headache, visual changes, nausea, vomiting, diarrhea, constipation, dizziness, abdominal pain, skin rash, fevers, chills, night sweats, weight loss, swollen lymph nodes, body aches, joint swelling, muscle aches, chest pain, shortness of breath, mood changes.   Objective Blood pressure 124/80, pulse 78, weight 208 lb (94.348 kg), SpO2 95 %.  General: No apparent distress alert and oriented x3 mood and affect normal, dressed appropriately.  HEENT: Pupils equal, extraocular movements intact  Respiratory:  Patient's speak in full sentences and does not appear short of breath  Cardiovascular: No lower extremity edema, non tender, no erythema  Skin: Warm dry intact with no signs of infection or rash on extremities or on axial skeleton.  Abdomen: Soft nontender  Neuro: Cranial nerves II through XII are intact, neurovascularly intact in all extremities with 2+ DTRs and 2+ pulses.  Lymph: No lymphadenopathy of posterior or anterior cervical chain or axillae bilaterally.  Gait normal with good balance and coordination.  MSK:  Non tender with full range of motion and good stability and symmetric strength and tone of shoulders, elbows, wrist, hip, knee and ankles bilaterally.  Back Exam:  Inspection: Unremarkable  Motion: Flexion 35 deg, Extension 45 deg, Side Bending to 45 deg bilaterally,  Rotation to 45 deg bilaterally  SLR laying: Negative  XSLR laying: Negative  Palpable tenderness: Nontender today FABER: Positive right. Sensory change: Gross sensation intact to all lumbar and sacral dermatomes.  Reflexes: 2+ at both patellar tendons, 2+ at achilles tendons, Babinski's downgoing.  Strength at foot  Plantar-flexion: 5/5 Dorsi-flexion: 5/5 Eversion: 5/5 Inversion: 5/5  Leg strength  Quad: 5/5 Hamstring: 5/5 Hip flexor: 5/5 Hip abductors: 4/5 but symmetric Gait unremarkable.  Osteopathic findings Cervical  C2 flexed rotated and side bent right  Thoracic T5 extended rotated and side bent left  Lumbar L2 flexed rotated and side bent right L4 flexed rotated and side bent left  Sacrum Left on left     Impression and Recommendations:  This case required medical decision making of moderate complexity.

## 2014-12-10 NOTE — Assessment & Plan Note (Signed)
Decision today to treat with OMT was based on Physical Exam  After verbal consent patient was treated with HVLA, ME techniques in crevical, thoracic, and lumbar areas  Patient tolerated the procedure well with improvement in symptoms  Patient given exercises, stretches and lifestyle modifications  See medications in patient instructions if given  Patient will follow up in 6-8 weeks

## 2015-01-19 ENCOUNTER — Other Ambulatory Visit: Payer: Self-pay | Admitting: Family Medicine

## 2015-02-02 ENCOUNTER — Other Ambulatory Visit: Payer: Self-pay | Admitting: Family Medicine

## 2015-02-04 NOTE — Telephone Encounter (Signed)
Ambien called into [facility].

## 2015-02-04 NOTE — Telephone Encounter (Signed)
Okay to refill Ambien 90 tablets with refills 1

## 2015-02-04 NOTE — Telephone Encounter (Signed)
Ambien refill request.  Last seen 06/03/2014.  Last filled 07/24/2014.  Please advise.

## 2015-02-12 ENCOUNTER — Encounter: Payer: Self-pay | Admitting: Family Medicine

## 2015-02-12 ENCOUNTER — Ambulatory Visit (INDEPENDENT_AMBULATORY_CARE_PROVIDER_SITE_OTHER): Payer: 59 | Admitting: Family Medicine

## 2015-02-12 VITALS — BP 124/86 | HR 82 | Wt 213.0 lb

## 2015-02-12 DIAGNOSIS — M533 Sacrococcygeal disorders, not elsewhere classified: Secondary | ICD-10-CM | POA: Diagnosis not present

## 2015-02-12 DIAGNOSIS — M9903 Segmental and somatic dysfunction of lumbar region: Secondary | ICD-10-CM

## 2015-02-12 DIAGNOSIS — M999 Biomechanical lesion, unspecified: Secondary | ICD-10-CM

## 2015-02-12 DIAGNOSIS — M9904 Segmental and somatic dysfunction of sacral region: Secondary | ICD-10-CM

## 2015-02-12 DIAGNOSIS — M9902 Segmental and somatic dysfunction of thoracic region: Secondary | ICD-10-CM

## 2015-02-12 NOTE — Progress Notes (Signed)
[facility] [place], [place] [address] Phone: [phone] Subjective:     CC: Low back pain follow up  [id number] Susan Burch is a 59 y.o. female coming in with complaint of low back pain. Patient was found to have more of a sacroiliac joint dysfunction. Patient has not been doing exercises on a regular basis but states that she is doing better overall. Mild discomfort over the right gluteal region as well as the left gluteal region. Patient has not notice any significant pain that stops her from activity. Patient did have more soreness on the backside after being on vacation and doing a significant amount walking. No radicular symptoms. Overall still happy with the results.    No past medical history on file. No past surgical history on file. No family history on file. Social History   Social History  . Marital Status: Married    Spouse Name: N/A  . Number of Children: N/A  . Years of Education: N/A   Occupational History  . Not on file.   Social History Main Topics  . Smoking status: Former Smoker -- 0.50 packs/day for 4 years    Types: Cigarettes    Quit date: 07/12/2004  . Smokeless tobacco: Not on file  . Alcohol Use: Not on file  . Drug Use: Not on file  . Sexual Activity: Not on file   Other Topics Concern  . Not on file   Social History Narrative   Allergies  Allergen Reactions  . Bactrim   . Penicillins     Past medical history, social, surgical and family history all reviewed in electronic medical record.   Review of Systems: No headache, visual changes, nausea, vomiting, diarrhea, constipation, dizziness, abdominal pain, skin rash, fevers, chills, night sweats, weight loss, swollen lymph nodes, body aches, joint swelling, muscle aches, chest pain, shortness of breath, mood changes.   Objective Blood pressure 124/86, pulse 82, weight 213 lb (96.616 kg), SpO2 98 %.  General: No apparent distress alert and  oriented x3 mood and affect normal, dressed appropriately.  HEENT: Pupils equal, extraocular movements intact  Respiratory: Patient's speak in full sentences and does not appear short of breath  Cardiovascular: No lower extremity edema, non tender, no erythema  Skin: Warm dry intact with no signs of infection or rash on extremities or on axial skeleton.  Abdomen: Soft nontender  Neuro: Cranial nerves II through XII are intact, neurovascularly intact in all extremities with 2+ DTRs and 2+ pulses.  Lymph: No lymphadenopathy of posterior or anterior cervical chain or axillae bilaterally.  Gait normal with good balance and coordination.  MSK:  Non tender with full range of motion and good stability and symmetric strength and tone of shoulders, elbows, wrist, hip, knee and ankles bilaterally.  Back Exam:  Inspection: Unremarkable  Motion: Flexion 35 deg, Extension 45 deg, Side Bending to 45 deg bilaterally,  Rotation to 45 deg bilaterally  SLR laying: Negative  XSLR laying: Negative  Palpable tenderness: Nontender today FABER: Positive right. Sensory change: Gross sensation intact to all lumbar and sacral dermatomes.  Reflexes: 2+ at both patellar tendons, 2+ at achilles tendons, Babinski's downgoing.  Strength at foot  Plantar-flexion: 5/5 Dorsi-flexion: 5/5 Eversion: 5/5 Inversion: 5/5  Leg strength  Quad: 5/5 Hamstring: 5/5 Hip flexor: 5/5 Hip abductors: 4/5 but symmetric with no improvement Gait unremarkable.  Osteopathic findings Cervical  C2 flexed rotated and side bent right  Thoracic T5 extended rotated and side  bent left T8 extended rotated inside that right  Lumbar L2 flexed rotated and side bent right L4 flexed rotated and side bent left  Sacrum Left on left No change in better    Impression and Recommendations:     This case required medical decision making of moderate complexity.

## 2015-02-12 NOTE — Progress Notes (Signed)
Pre visit review using our clinic review tool, if applicable. No additional management support is needed unless otherwise documented below in the visit note. 

## 2015-02-12 NOTE — Assessment & Plan Note (Signed)
Decision today to treat with OMT was based on Physical Exam  After verbal consent patient was treated with HVLA, ME techniques in crevical, thoracic, and lumbar areas  Patient tolerated the procedure well with improvement in symptoms  Patient given exercises, stretches and lifestyle modifications  See medications in patient instructions if given  Patient will follow up in 8-12 weeks

## 2015-02-12 NOTE — Assessment & Plan Note (Signed)
Patient overall is doing relatively well. Encourage her to do more the hip abductor exercises. We discussed the importance of the core strength. Discussed the importance of also proper shoes. Patient will continue to remain active. His lungs patient continues to well we'll see her every 2-3 months for further evaluation and treatment.

## 2015-02-12 NOTE — Patient Instructions (Addendum)
Good to see you Continue the icing.  You are doing great overall.  No real changes  Exercises on wall.  Heel and butt touching.  Raise leg 6 inches and hold 2 seconds.  Down slow for count of 4 seconds.  1 set of 30 reps daily on both sides.  Continue to work on the core.  See me again in 2-3 months!

## 2015-04-30 ENCOUNTER — Other Ambulatory Visit: Payer: Self-pay | Admitting: Family Medicine

## 2015-04-30 MED FILL — SYNTHROID 125 MCG TABLET: 125 | 90 days supply | Qty: 90 | Fill #0

## 2015-05-04 ENCOUNTER — Ambulatory Visit: Payer: 59 | Admitting: Family Medicine

## 2015-05-05 MED FILL — ZOLPIDEM TARTRATE 5 MG TAB: 5 | 90 days supply | Qty: 90 | Fill #1

## 2015-05-13 ENCOUNTER — Ambulatory Visit (INDEPENDENT_AMBULATORY_CARE_PROVIDER_SITE_OTHER): Payer: 59 | Admitting: Family Medicine

## 2015-05-13 ENCOUNTER — Encounter: Payer: Self-pay | Admitting: Family Medicine

## 2015-05-13 VITALS — BP 112/82 | HR 74 | Ht 63.0 in | Wt 206.0 lb

## 2015-05-13 DIAGNOSIS — M9904 Segmental and somatic dysfunction of sacral region: Secondary | ICD-10-CM

## 2015-05-13 DIAGNOSIS — M9903 Segmental and somatic dysfunction of lumbar region: Secondary | ICD-10-CM

## 2015-05-13 DIAGNOSIS — M9901 Segmental and somatic dysfunction of cervical region: Secondary | ICD-10-CM

## 2015-05-13 DIAGNOSIS — M9902 Segmental and somatic dysfunction of thoracic region: Secondary | ICD-10-CM

## 2015-05-13 DIAGNOSIS — M7740 Metatarsalgia, unspecified foot: Secondary | ICD-10-CM | POA: Diagnosis not present

## 2015-05-13 DIAGNOSIS — M533 Sacrococcygeal disorders, not elsewhere classified: Secondary | ICD-10-CM

## 2015-05-13 DIAGNOSIS — M999 Biomechanical lesion, unspecified: Secondary | ICD-10-CM

## 2015-05-13 NOTE — Progress Notes (Signed)
Pre visit review using our clinic review tool, if applicable. No additional management support is needed unless otherwise documented below in the visit note. 

## 2015-05-13 NOTE — Assessment & Plan Note (Signed)
Decision today to treat with OMT was based on Physical Exam  After verbal consent patient was treated with HVLA, ME techniques in crevical, thoracic, and lumbar areas  Patient tolerated the procedure well with improvement in symptoms  Patient given exercises, stretches and lifestyle modifications  See medications in patient instructions if given  Patient will follow up in 4-6 weeks    

## 2015-05-13 NOTE — Patient Instructions (Addendum)
Great to see you  Susan Burch is your friend That may have been a little long For your feet  Try stretches at night [employer] orthotics "total support" online could be great to help the feet Keep wearing good shoes.  You have made progress on the hip abductors.  See me again in 6-8 weeks

## 2015-05-13 NOTE — Progress Notes (Signed)
[name] [place], [place] [address] Phone: [phone] Subjective:     CC: Low back pain follow up  [id number] Susan Burch is a 60 y.o. female coming in with complaint of low back pain. Patient was found to have more of a sacroiliac joint dysfunction.patient has had some exacerbation. Increasing discomfort overall. Describes it as a dull, throbbing aching sensation. Has been working more. No radiation down the legs. Patient is also complaining of new problem. Bilateral foot pain. Seems to be worse when she is standing on a more working. More of an aching sensation. No true injury. Rates the severity of 5 out of 10. Has not tried any home modalities at this time.   No past medical history on file. No past surgical history on file. No family history on file.denies any family history of rheumatoid arthritis Social History   Social History  . Marital Status: Married    Spouse Name: N/A  . Number of Children: N/A  . Years of Education: N/A   Occupational History  . Not on file.   Social History Main Topics  . Smoking status: Former Smoker -- 0.50 packs/day for 4 years    Types: Cigarettes    Quit date: 07/12/2004  . Smokeless tobacco: Not on file  . Alcohol Use: Not on file  . Drug Use: Not on file  . Sexual Activity: Not on file   Other Topics Concern  . Not on file   Social History Narrative   Allergies  Allergen Reactions  . Bactrim   . Penicillins     Past medical history, social, surgical and family history all reviewed in electronic medical record.   Review of Systems: No headache, visual changes, nausea, vomiting, diarrhea, constipation, dizziness, abdominal pain, skin rash, fevers, chills, night sweats, weight loss, swollen lymph nodes, body aches, joint swelling, muscle aches, chest pain, shortness of breath, mood changes.   Objective Blood pressure 112/82, pulse 74, height 5\' 3"  (1.6 m), weight 206 lb (93.441  kg), SpO2 93 %.  General: No apparent distress alert and oriented x3 mood and affect normal, dressed appropriately.  HEENT: Pupils equal, extraocular movements intact  Respiratory: Patient's speak in full sentences and does not appear short of breath  Cardiovascular: No lower extremity edema, non tender, no erythema  Skin: Warm dry intact with no signs of infection or rash on extremities or on axial skeleton.  Abdomen: Soft nontender  Neuro: Cranial nerves II through XII are intact, neurovascularly intact in all extremities with 2+ DTRs and 2+ pulses.  Lymph: No lymphadenopathy of posterior or anterior cervical chain or axillae bilaterally.  Gait normal with good balance and coordination.  MSK:  Non tender with full range of motion and good stability and symmetric strength and tone of shoulders, elbows, wrist, hip, knee and ankles bilaterally.   Foot exam shows the patient does have breakdown of the transverse arch bilaterally right greater than left. Bunion and bunionette formation of the first and fifth toes bilaterally. Patient has very minimal overpronation of the hindfoot. Wide forefoot.  Back Exam:  Inspection: Unremarkable  Motion: Flexion 35 deg, Extension 45 deg, Side Bending to 45 deg bilaterally,  Rotation to 45 deg bilaterally  SLR laying: Negative  XSLR laying: Negative  Palpable tenderness:Increasing tenderness over the sacroiliac joint from previous exam FABER: Positive right. Sensory change: Gross sensation intact to all lumbar and sacral dermatomes.  Reflexes: 2+ at both patellar tendons, 2+ at achilles  tendons, Babinski's downgoing.  Strength at foot  Plantar-flexion: 5/5 Dorsi-flexion: 5/5 Eversion: 5/5 Inversion: 5/5  Leg strength  Quad: 5/5 Hamstring: 5/5 Hip flexor: 5/5 Hip abductors: 4/5 but symmetric with no improvement Gait unremarkable.  Osteopathic findings Cervical  C2 flexed rotated and side bent right  Thoracic T5 extended rotated and side bent  left T8 extended rotated inside that right  Lumbar L2 flexed rotated and side bent right L4 flexed rotated and side bent left  Sacrum Left on leftwithin pain on testing     Impression and Recommendations:     This case required medical decision making of moderate complexity.

## 2015-05-13 NOTE — Assessment & Plan Note (Signed)
Patient has had bilateral foot pain. Seems to be more secondary to likely her working on a more regular basis. Possible increase in weight as well. Patient is coming try some over-the-counter orthotics and patient is a home exercises. Patient not having any significant improvement she could be a candidate for custom orthotics but I think she will do well with conservative therapy.

## 2015-05-13 NOTE — Assessment & Plan Note (Signed)
Worsening at this time. Seems to be more of an exacerbation. Encourage her to start doing the exercises on areolar basis. We discussed icing regimen. We discussed topical anti-inflammatories and patient will try this. Patient likely also her metatarsalgia is likely contributing to some of the malalignment. Patient will try to get the over-the-counter orthotics. Patient come back and see me again in 4-6 weeks for further evaluation and treatment.

## 2015-05-26 ENCOUNTER — Encounter: Payer: Self-pay | Admitting: Family Medicine

## 2015-05-26 ENCOUNTER — Ambulatory Visit (INDEPENDENT_AMBULATORY_CARE_PROVIDER_SITE_OTHER): Payer: 59 | Admitting: Family Medicine

## 2015-05-26 VITALS — BP 120/80 | Temp 98.3°F | Wt 205.0 lb

## 2015-05-26 DIAGNOSIS — G4700 Insomnia, unspecified: Secondary | ICD-10-CM

## 2015-05-26 DIAGNOSIS — Z0000 Encounter for general adult medical examination without abnormal findings: Secondary | ICD-10-CM | POA: Diagnosis not present

## 2015-05-26 DIAGNOSIS — E038 Other specified hypothyroidism: Secondary | ICD-10-CM

## 2015-05-26 LAB — BASIC METABOLIC PANEL
BUN: 16 mg/dL (ref 6–23)
CO2: 30 mEq/L (ref 19–32)
Calcium: 9.8 mg/dL (ref 8.4–10.5)
Chloride: 104 mEq/L (ref 96–112)
Creatinine, Ser: 1.06 mg/dL (ref 0.40–1.20)
GFR: 56.63 mL/min — AB (ref >60.00)
Glucose, Bld: 101 mg/dL — ABNORMAL HIGH (ref 70–99)
POTASSIUM: 4.7 meq/L (ref 3.5–5.1)
SODIUM: 143 meq/L (ref 135–145)

## 2015-05-26 LAB — HEPATIC FUNCTION PANEL
ALBUMIN: 4.3 g/dL (ref 3.5–5.2)
ALK PHOS: 95 U/L (ref 39–117)
ALT: 29 U/L (ref 0–35)
AST: 29 U/L (ref 0–37)
BILIRUBIN DIRECT: 0.1 mg/dL (ref 0.0–0.3)
Total Bilirubin: 0.6 mg/dL (ref 0.2–1.2)
Total Protein: 6.7 g/dL (ref 6.0–8.3)

## 2015-05-26 LAB — CBC WITH DIFFERENTIAL/PLATELET
Basophils Absolute: 0 10*3/uL (ref 0.0–0.1)
Basophils Relative: 0.5 % (ref 0.0–3.0)
EOS PCT: 5.8 % — AB (ref 0.0–5.0)
Eosinophils Absolute: 0.4 10*3/uL (ref 0.0–0.7)
HCT: 46.3 % — ABNORMAL HIGH (ref 36.0–46.0)
Hemoglobin: 15.6 g/dL — ABNORMAL HIGH (ref 12.0–15.0)
LYMPHS ABS: 1.3 10*3/uL (ref 0.7–4.0)
Lymphocytes Relative: 19.5 % (ref 12.0–46.0)
MCHC: 33.7 g/dL (ref 30.0–36.0)
MCV: 95.9 fl (ref 78.0–100.0)
MONO ABS: 0.8 10*3/uL (ref 0.1–1.0)
MONOS PCT: 11.5 % (ref 3.0–12.0)
NEUTROS ABS: 4.1 10*3/uL (ref 1.4–7.7)
NEUTROS PCT: 62.7 % (ref 43.0–77.0)
PLATELETS: 276 10*3/uL (ref 150.0–400.0)
RBC: 4.83 Mil/uL (ref 3.87–5.11)
RDW: 13.9 % (ref 11.5–15.5)
WBC: 6.6 10*3/uL (ref 4.0–10.5)

## 2015-05-26 LAB — TSH: TSH: 10.78 u[IU]/mL — AB (ref 0.35–4.50)

## 2015-05-26 LAB — LIPID PANEL
CHOLESTEROL: 245 mg/dL — AB (ref 0–200)
HDL: 47.7 mg/dL (ref >39.00)
NONHDL: 196.84
TRIGLYCERIDES: 374 mg/dL — AB (ref 0.0–149.0)
Total CHOL/HDL Ratio: 5
VLDL: 74.8 mg/dL — ABNORMAL HIGH (ref 0.0–40.0)

## 2015-05-26 LAB — POCT URINALYSIS DIPSTICK
BILIRUBIN UA: NEGATIVE
Glucose, UA: NEGATIVE
KETONES UA: NEGATIVE
LEUKOCYTES UA: NEGATIVE
Nitrite, UA: NEGATIVE
PH UA: 5.5
PROTEIN UA: NEGATIVE
RBC UA: NEGATIVE
Urobilinogen, UA: 0.2

## 2015-05-26 LAB — LDL CHOLESTEROL, DIRECT: LDL DIRECT: 148 mg/dL

## 2015-05-26 MED ORDER — SYNTHROID 125 MCG PO TABS: 125.0000 ug | ORAL_TABLET | Freq: Every day | ORAL | Status: DC

## 2015-05-26 MED ORDER — ZOLPIDEM TARTRATE 5 MG PO TABS: 5.0000 mg | ORAL_TABLET | Freq: Every evening | ORAL | Status: DC | PRN

## 2015-05-26 NOTE — Patient Instructions (Signed)
Continue current medicines  Continue diet and exercise program  Labs today.........Marland Kitchen we will call you if any things that normal  Also get you set up for a bone density  Recommend routine eye care and you mammography

## 2015-05-26 NOTE — Progress Notes (Signed)
   Subjective:    Patient ID: Susan Burch, female    DOB: 06/26/1955, 60 y.o.   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 who comes in today for general checkup and follow-up on her thyroid  She takes Synthroid 125 g daily. She's due for TSH level  She takes over-the-counter Zantac and Ambien 5 mg for sleep. Her sleep dysfunction started when she went to menopause. She's tried other options nothing seemed to help as good as the Ambien.  She does not get routine eye care, recommended annual eye exams. She does get regular dental care. She's due for mammogram. Bone density was done about 20 years ago was normal. Her LMP was around age 47 last Pap was in October 2015 was normal. She's never had trouble with her Pap smears in the past therefore she would be in the category every 3 years. She has no GYN complaints bloating etc.  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 Her weight got up to 213 pounds she's got on a diet is down to 205.  Vaccinations up-to-date    Review of Systems  Constitutional: Negative.   HENT: Negative.   Eyes: Negative.   Respiratory: Negative.   Cardiovascular: Negative.   Gastrointestinal: Negative.   Endocrine: Negative.   Genitourinary: Negative.   Musculoskeletal: Negative.   Skin: Negative.   Allergic/Immunologic: Negative.   Neurological: Negative.   Hematological: Negative.   Psychiatric/Behavioral: Negative.        Objective:   Physical Exam  Constitutional: She is oriented to person, place, and time. She appears well-developed and well-nourished.  HENT:  Head: Normocephalic and atraumatic.  Right Ear: External ear normal.  Left Ear: External ear normal.  Nose: Nose normal.  Mouth/Throat: Oropharynx is clear and moist.  Eyes: EOM are normal. Pupils are equal, round, and reactive to light.  Neck: Normal range of  motion. Neck supple. No JVD present. No tracheal deviation present. No thyromegaly present.  Cardiovascular: Normal rate, regular rhythm, normal heart sounds and intact distal pulses.  Exam reveals no gallop and no friction rub.   No murmur heard. Pulmonary/Chest: Effort normal and breath sounds normal. No stridor. No respiratory distress. She has no wheezes. She has no rales. She exhibits no tenderness.  Abdominal: Soft. Bowel sounds are normal. She exhibits no distension and no mass. There is no tenderness. There is no rebound and no guarding.  Genitourinary:  Bilateral breast exam normal  Musculoskeletal: Normal range of motion. She exhibits no edema or tenderness.  Lymphadenopathy:    She has no cervical adenopathy.  Neurological: She is alert and oriented to person, place, and time. She has normal reflexes. No cranial nerve deficit. She exhibits normal muscle tone. Coordination normal.  Skin: Skin is warm and dry. No rash noted. No erythema. No pallor.  She has light skin and many many freckles. She has [place] type skin. All her freckles were examined carefully can see no abnormalities  Psychiatric: She has a normal mood and affect. Her behavior is normal. Judgment and thought content normal.  Nursing note and vitals reviewed.         Assessment & Plan:  History of hypothyroidism.....Marland Kitchen continue Synthroid..... Check labs  Postmenopausal sleep dysfunction........ continue Ambien 5 mg at bedtime  Overweight.......... continue diet exercise and weight loss

## 2015-05-26 NOTE — Progress Notes (Signed)
Pre visit review using our clinic review tool, if applicable. No additional management support is needed unless otherwise documented below in the visit note. 

## 2015-05-27 ENCOUNTER — Other Ambulatory Visit: Payer: Self-pay | Admitting: *Deleted

## 2015-05-27 MED ORDER — LEVOTHYROXINE SODIUM 150 MCG PO TABS: 150.0000 ug | ORAL_TABLET | Freq: Every day | ORAL | Status: DC

## 2015-05-27 MED FILL — SYNTHROID 150 MCG TABLET: 150 | 90 days supply | Qty: 90 | Fill #0

## 2015-06-02 ENCOUNTER — Telehealth: Payer: Self-pay | Admitting: Family Medicine

## 2015-06-02 ENCOUNTER — Other Ambulatory Visit: Payer: Self-pay | Admitting: Family Medicine

## 2015-06-02 DIAGNOSIS — Z780 Asymptomatic menopausal state: Secondary | ICD-10-CM

## 2015-06-02 DIAGNOSIS — Z1231 Encounter for screening mammogram for malignant neoplasm of breast: Secondary | ICD-10-CM

## 2015-06-02 NOTE — Telephone Encounter (Signed)
New order placed

## 2015-06-02 NOTE — Telephone Encounter (Signed)
Cherish from [facility] needs a call back -- she stated she needs a diagnosis code changed on a bone density test   [phone]

## 2015-06-24 ENCOUNTER — Ambulatory Visit: Payer: 59 | Admitting: Family Medicine

## 2015-07-01 ENCOUNTER — Other Ambulatory Visit (INDEPENDENT_AMBULATORY_CARE_PROVIDER_SITE_OTHER): Payer: 59

## 2015-07-01 ENCOUNTER — Other Ambulatory Visit: Payer: 59

## 2015-07-01 DIAGNOSIS — E039 Hypothyroidism, unspecified: Secondary | ICD-10-CM | POA: Diagnosis not present

## 2015-07-01 DIAGNOSIS — I519 Heart disease, unspecified: Principal | ICD-10-CM

## 2015-07-01 LAB — TSH: TSH: 7.65 u[IU]/mL — AB (ref 0.35–4.50)

## 2015-07-03 ENCOUNTER — Encounter: Payer: Self-pay | Admitting: Family Medicine

## 2015-07-03 ENCOUNTER — Other Ambulatory Visit: Payer: Self-pay | Admitting: Family Medicine

## 2015-07-03 ENCOUNTER — Other Ambulatory Visit: Payer: Self-pay | Admitting: *Deleted

## 2015-07-03 ENCOUNTER — Ambulatory Visit (INDEPENDENT_AMBULATORY_CARE_PROVIDER_SITE_OTHER): Payer: 59 | Admitting: Family Medicine

## 2015-07-03 VITALS — BP 130/84 | HR 95 | Ht 63.0 in | Wt 204.0 lb

## 2015-07-03 DIAGNOSIS — M533 Sacrococcygeal disorders, not elsewhere classified: Secondary | ICD-10-CM

## 2015-07-03 DIAGNOSIS — M999 Biomechanical lesion, unspecified: Secondary | ICD-10-CM

## 2015-07-03 DIAGNOSIS — M9903 Segmental and somatic dysfunction of lumbar region: Secondary | ICD-10-CM

## 2015-07-03 DIAGNOSIS — M9902 Segmental and somatic dysfunction of thoracic region: Secondary | ICD-10-CM

## 2015-07-03 DIAGNOSIS — E038 Other specified hypothyroidism: Secondary | ICD-10-CM

## 2015-07-03 DIAGNOSIS — M9904 Segmental and somatic dysfunction of sacral region: Secondary | ICD-10-CM

## 2015-07-03 MED ORDER — LEVOTHYROXINE SODIUM 175 MCG PO TABS: 175.0000 ug | ORAL_TABLET | Freq: Every day | ORAL | Status: DC

## 2015-07-03 MED FILL — SYNTHROID 175 MCG TABLET: 175 | 90 days supply | Qty: 90 | Fill #0

## 2015-07-03 NOTE — Progress Notes (Signed)
Pre visit review using our clinic review tool, if applicable. No additional management support is needed unless otherwise documented below in the visit note. 

## 2015-07-03 NOTE — Assessment & Plan Note (Signed)
Patient continues to have difficulty at this time. We did discuss the possibility of an injection which patient declined. Patient also has had the opportunity to go to formal physical therapy. We discussed continuing the proper shoes and the home exercises. Encourage patient to do more core strengthening. Patient will come back and see me again in 4-8 weeks for further evaluation and treatment.

## 2015-07-03 NOTE — Assessment & Plan Note (Signed)
Decision today to treat with OMT was based on Physical Exam  After verbal consent patient was treated with HVLA, ME techniques in crevical, thoracic, and lumbar areas  Patient tolerated the procedure well with improvement in symptoms  Patient given exercises, stretches and lifestyle modifications  See medications in patient instructions if given  Patient will follow up in 4-8 weeks

## 2015-07-03 NOTE — Progress Notes (Signed)
[name] [address], [place] [address] Phone: [phone] Subjective:     CC: Low back pain follow up  [id number] IA HANNEMANN is a 60 y.o. female coming in with complaint of low back pain. Patient was found to have more of a sacroiliac joint dysfunction.. Patient states overall she does continue to have a very mild dull pain. States that the manipulation keeps it at base from having any severe exacerbations. Has not been out of work and been able to do all daily activities. Denies any numbness or tingling. Denies any radiation down the leg. Continues to be localized mostly on the right side.   No past medical history on file.  Patient Active Problem List   Diagnosis Date Noted  . Routine general medical examination at a health care facility 05/26/2015  . Metatarsalgia 05/13/2015  . Muscle strain of right gluteal region 11/19/2014  . Sacroiliac joint dysfunction of right side 08/11/2014  . Nonallopathic lesion of sacral region 08/11/2014  . Nonallopathic lesion of thoracic region 08/11/2014  . Nonallopathic lesion of lumbosacral region 08/11/2014  . Postmenopausal disorder 02/14/2013  . Sleep initiation dysfunction 09/27/2011  . ADVERSE DRUG REACTION, SULFA 08/12/2009  . DERMATITIS, CONTACT, NOS 11/28/2006  . PAIN IN JOINT, LOWER LEG 11/28/2006  . Hypothyroidism 11/06/2006  . ALLERGIC RHINITIS 11/06/2006  . GERD 11/06/2006    No past surgical history on file. No family history on file.denies any family history of rheumatoid arthritis Social History   Social History  . Marital Status: Married    Spouse Name: N/A  . Number of Children: N/A  . Years of Education: N/A   Occupational History  . Not on file.   Social History Main Topics  . Smoking status: Former Smoker -- 0.50 packs/day for 4 years    Types: Cigarettes    Quit date: 07/12/2004  . Smokeless tobacco: Not on file  . Alcohol Use: Not on file  . Drug Use: Not on  file  . Sexual Activity: Not on file   Other Topics Concern  . Not on file   Social History Narrative   Allergies  Allergen Reactions  . Bactrim   . Penicillins     Past medical history, social, surgical and family history all reviewed in electronic medical record.   Review of Systems: No headache, visual changes, nausea, vomiting, diarrhea, constipation, dizziness, abdominal pain, skin rash, fevers, chills, night sweats, weight loss, swollen lymph nodes, body aches, joint swelling, muscle aches, chest pain, shortness of breath, mood changes.   Objective Blood pressure 130/84, pulse 95, height 5\' 3"  (1.6 m), weight 204 lb (92.534 kg), SpO2 94 %.  General: No apparent distress alert and oriented x3 mood and affect normal, dressed appropriately.  HEENT: Pupils equal, extraocular movements intact  Respiratory: Patient's speak in full sentences and does not appear short of breath  Cardiovascular: No lower extremity edema, non tender, no erythema  Skin: Warm dry intact with no signs of infection or rash on extremities or on axial skeleton.  Abdomen: Soft nontender  Neuro: Cranial nerves II through XII are intact, neurovascularly intact in all extremities with 2+ DTRs and 2+ pulses.  Lymph: No lymphadenopathy of posterior or anterior cervical chain or axillae bilaterally.  Gait normal with good balance and coordination.  MSK:  Non tender with full range of motion and good stability and symmetric strength and tone of shoulders, elbows, wrist, hip, knee and ankles bilaterally.   Foot exam  shows the patient does have breakdown of the transverse arch bilaterally right greater than left. Bunion and bunionette formation of the first and fifth toes bilaterally. Patient has very minimal overpronation of the hindfoot. Wide forefoot.  Back Exam:  Inspection: Unremarkable  Motion: Flexion 35 deg, Extension 45 deg, Side Bending to 45 deg bilaterally,  Rotation to 45 deg bilaterally  SLR laying:  Negative  XSLR laying: Negative  Palpable tenderness:Less tenderness than prior but still has pain over the piriformis as well as the gluteal region mostly on the posterior lateral aspect of the hip. FABER: Positive right. Sensory change: Gross sensation intact to all lumbar and sacral dermatomes.  Reflexes: 2+ at both patellar tendons, 2+ at achilles tendons, Babinski's downgoing.  Strength at foot  Plantar-flexion: 5/5 Dorsi-flexion: 5/5 Eversion: 5/5 Inversion: 5/5  Leg strength  Quad: 5/5 Hamstring: 5/5 Hip flexor: 5/5 Hip abductors: 4/5 but symmetric with no improvement Gait unremarkable.  Osteopathic findings Cervical  C2 flexed rotated and side bent right  Thoracic T5 extended rotated and side bent left T7 extended rotated inside that right  Lumbar L2 flexed rotated and side bent right L4 flexed rotated and side bent left L5 flexed rotated and side bent right  Sacrum Left on left      Impression and Recommendations:     This case required medical decision making of moderate complexity.

## 2015-07-03 NOTE — Patient Instructions (Signed)
Great to see you  Continue the exercises and work on posture when you can  ICe when you need it See me again in 2 months!

## 2015-07-08 ENCOUNTER — Ambulatory Visit
Admission: RE | Admit: 2015-07-08 | Discharge: 2015-07-08 | Disposition: A | Discharge status: Home / Self Care | Payer: 59 | Source: Ambulatory Visit | Attending: Family Medicine | Admitting: Family Medicine

## 2015-07-08 DIAGNOSIS — Z780 Asymptomatic menopausal state: Secondary | ICD-10-CM

## 2015-07-08 DIAGNOSIS — Z1231 Encounter for screening mammogram for malignant neoplasm of breast: Secondary | ICD-10-CM

## 2015-07-08 DIAGNOSIS — M85851 Other specified disorders of bone density and structure, right thigh: Secondary | ICD-10-CM | POA: Diagnosis not present

## 2015-07-14 ENCOUNTER — Other Ambulatory Visit: Payer: Self-pay | Admitting: Obstetrics & Gynecology

## 2015-07-14 DIAGNOSIS — R928 Other abnormal and inconclusive findings on diagnostic imaging of breast: Secondary | ICD-10-CM

## 2015-07-21 DIAGNOSIS — M778 Other enthesopathies, not elsewhere classified: Secondary | ICD-10-CM | POA: Insufficient documentation

## 2015-07-21 DIAGNOSIS — M6702 Short Achilles tendon (acquired), left ankle: Secondary | ICD-10-CM | POA: Diagnosis not present

## 2015-07-21 DIAGNOSIS — M722 Plantar fascial fibromatosis: Secondary | ICD-10-CM | POA: Diagnosis not present

## 2015-07-21 DIAGNOSIS — M7751 Other enthesopathy of right foot: Secondary | ICD-10-CM | POA: Diagnosis not present

## 2015-07-21 DIAGNOSIS — M779 Enthesopathy, unspecified: Secondary | ICD-10-CM

## 2015-08-03 MED FILL — ZOLPIDEM TARTRATE 5 MG TAB: 5 | 90 days supply | Qty: 90 | Fill #0

## 2015-08-06 ENCOUNTER — Other Ambulatory Visit (INDEPENDENT_AMBULATORY_CARE_PROVIDER_SITE_OTHER): Payer: 59

## 2015-08-06 DIAGNOSIS — E038 Other specified hypothyroidism: Secondary | ICD-10-CM

## 2015-08-06 LAB — TSH: TSH: 4.21 u[IU]/mL (ref 0.35–4.50)

## 2015-08-13 DIAGNOSIS — M722 Plantar fascial fibromatosis: Secondary | ICD-10-CM | POA: Diagnosis not present

## 2015-08-13 DIAGNOSIS — M7751 Other enthesopathy of right foot: Secondary | ICD-10-CM | POA: Diagnosis not present

## 2015-08-13 DIAGNOSIS — M6702 Short Achilles tendon (acquired), left ankle: Secondary | ICD-10-CM | POA: Diagnosis not present

## 2015-08-26 ENCOUNTER — Encounter: Payer: Self-pay | Admitting: Family Medicine

## 2015-08-26 ENCOUNTER — Ambulatory Visit (INDEPENDENT_AMBULATORY_CARE_PROVIDER_SITE_OTHER): Payer: 59 | Admitting: Family Medicine

## 2015-08-26 VITALS — BP 112/84 | HR 80 | Ht 63.0 in | Wt 204.0 lb

## 2015-08-26 DIAGNOSIS — M9903 Segmental and somatic dysfunction of lumbar region: Secondary | ICD-10-CM

## 2015-08-26 DIAGNOSIS — M999 Biomechanical lesion, unspecified: Secondary | ICD-10-CM

## 2015-08-26 DIAGNOSIS — M9902 Segmental and somatic dysfunction of thoracic region: Secondary | ICD-10-CM

## 2015-08-26 DIAGNOSIS — M533 Sacrococcygeal disorders, not elsewhere classified: Secondary | ICD-10-CM

## 2015-08-26 DIAGNOSIS — M9904 Segmental and somatic dysfunction of sacral region: Secondary | ICD-10-CM | POA: Diagnosis not present

## 2015-08-26 NOTE — Patient Instructions (Signed)
Good to see you  Susan Burch is your friend Stay active.  Good shoes with rigid bottom.  [name], [place]rell or New balance greater then 700 [employer] orthotics "total support" online would be great  Vitamin C 500mg  with your big meal of the day can help with absorption of iron See me again in 6 weeks!

## 2015-08-26 NOTE — Assessment & Plan Note (Signed)
Decision today to treat with OMT was based on Physical Exam  After verbal consent patient was treated with HVLA, ME techniques in crevical, thoracic, and lumbar areas  Patient tolerated the procedure well with improvement in symptoms  Patient given exercises, stretches and lifestyle modifications  See medications in patient instructions if given  Patient will follow up in 6 weeks

## 2015-08-26 NOTE — Progress Notes (Signed)
Pre visit review using our clinic review tool, if applicable. No additional management support is needed unless otherwise documented below in the visit note. 

## 2015-08-26 NOTE — Progress Notes (Signed)
[facility] [address] [place], [place] [address] Phone: [phone] Subjective:     CC: Low back pain follow up  [id number] Susan Burch is a 60 y.o. female coming in with complaint of low back pain. Patient was found to have more of a sacroiliac joint dysfunction.. Was having exacerbation Discussed HEP and given some new medications, much better now, The [employer] medication and did do a lot of walking which cause some stiffness. Figured out how to do certain stretches a significant more beneficial. Overall seems to be making some progress. Patient denies any radiation of the leg or any numbness or weakness.   No past medical history on file.  Patient Active Problem List   Diagnosis Date Noted  . Routine general medical examination at a health care facility 05/26/2015  . Metatarsalgia 05/13/2015  . Muscle strain of right gluteal region 11/19/2014  . Sacroiliac joint dysfunction of right side 08/11/2014  . Nonallopathic lesion of sacral region 08/11/2014  . Nonallopathic lesion of thoracic region 08/11/2014  . Nonallopathic lesion of lumbosacral region 08/11/2014  . Postmenopausal disorder 02/14/2013  . Sleep initiation dysfunction 09/27/2011  . ADVERSE DRUG REACTION, SULFA 08/12/2009  . DERMATITIS, CONTACT, NOS 11/28/2006  . PAIN IN JOINT, LOWER LEG 11/28/2006  . Hypothyroidism 11/06/2006  . ALLERGIC RHINITIS 11/06/2006  . GERD 11/06/2006    No past surgical history on file. No family history on file.denies any family history of rheumatoid arthritis Social History   Social History  . Marital Status: Married    Spouse Name: N/A  . Number of Children: N/A  . Years of Education: N/A   Occupational History  . Not on file.   Social History Main Topics  . Smoking status: Former Smoker -- 0.50 packs/day for 4 years    Types: Cigarettes    Quit date: 07/12/2004  . Smokeless tobacco: Not on file  . Alcohol Use: Not on file  . Drug Use: Not  on file  . Sexual Activity: Not on file   Other Topics Concern  . Not on file   Social History Narrative   Allergies  Allergen Reactions  . Bactrim   . Penicillins     Past medical history, social, surgical and family history all reviewed in electronic medical record.   Review of Systems: No headache, visual changes, nausea, vomiting, diarrhea, constipation, dizziness, abdominal pain, skin rash, fevers, chills, night sweats, weight loss, swollen lymph nodes, body aches, joint swelling, muscle aches, chest pain, shortness of breath, mood changes.   Objective Blood pressure 112/84, pulse 80, height 5\' 3"  (1.6 m), weight 204 lb (92.534 kg), SpO2 96 %.  General: No apparent distress alert and oriented x3 mood and affect normal, dressed appropriately.  HEENT: Pupils equal, extraocular movements intact  Respiratory: Patient's speak in full sentences and does not appear short of breath  Cardiovascular: No lower extremity edema, non tender, no erythema  Skin: Warm dry intact with no signs of infection or rash on extremities or on axial skeleton.  Abdomen: Soft nontender  Neuro: Cranial nerves II through XII are intact, neurovascularly intact in all extremities with 2+ DTRs and 2+ pulses.  Lymph: No lymphadenopathy of posterior or anterior cervical chain or axillae bilaterally.  Gait normal with good balance and coordination.  MSK:  Non tender with full range of motion and good stability and symmetric strength and tone of shoulders, elbows, wrist, hip, knee and ankles bilaterally.    Back Exam:  Inspection:  Unremarkable  Motion: Flexion 35 deg, Extension 45 deg, Side Bending to 45 deg bilaterally,  Rotation to 45 deg bilaterally  SLR laying: Negative  XSLR laying: Negative  Palpable tenderness:Continues to improve a mild over the right sacroiliac joint  FABER: Positive right. Sensory change: Gross sensation intact to all lumbar and sacral dermatomes.  Reflexes: 2+ at both patellar  tendons, 2+ at achilles tendons, Babinski's downgoing.  Strength at foot  Plantar-flexion: 5/5 Dorsi-flexion: 5/5 Eversion: 5/5 Inversion: 5/5  Leg strength  Quad: 5/5 Hamstring: 5/5 Hip flexor: 5/5 Hip abductors: 4/5  Gait unremarkable.  Osteopathic findings Cervical  C2 flexed rotated and side bent right  Thoracic T5 extended rotated and side bent left T9 extended rotated inside that right  Lumbar L2 flexed rotated and side bent right L4 flexed rotated and side bent left  Sacrum Left on left      Impression and Recommendations:     This case required medical decision making of moderate complexity.

## 2015-08-26 NOTE — Assessment & Plan Note (Signed)
Continues to have some difficulty. Declined any type of injection today. Has declined any formal physical therapy. Encourage patient to continue to work on core strength. We discussed proper shoes again in greater detail the ankle be beneficial. We discussed icing regimen. Patient and will follow-up with me again in 6 weeks for further evaluation and treatment.

## 2015-09-28 MED FILL — SYNTHROID 175 MCG TABLET: 175 | 90 days supply | Qty: 90 | Fill #1

## 2015-10-08 ENCOUNTER — Ambulatory Visit (INDEPENDENT_AMBULATORY_CARE_PROVIDER_SITE_OTHER): Payer: 59 | Admitting: Family Medicine

## 2015-10-08 ENCOUNTER — Encounter: Payer: Self-pay | Admitting: Family Medicine

## 2015-10-08 VITALS — BP 116/82 | HR 82 | Ht 63.0 in | Wt 204.0 lb

## 2015-10-08 DIAGNOSIS — M9903 Segmental and somatic dysfunction of lumbar region: Secondary | ICD-10-CM

## 2015-10-08 DIAGNOSIS — M9902 Segmental and somatic dysfunction of thoracic region: Secondary | ICD-10-CM

## 2015-10-08 DIAGNOSIS — M9904 Segmental and somatic dysfunction of sacral region: Secondary | ICD-10-CM

## 2015-10-08 DIAGNOSIS — M533 Sacrococcygeal disorders, not elsewhere classified: Secondary | ICD-10-CM | POA: Diagnosis not present

## 2015-10-08 DIAGNOSIS — M999 Biomechanical lesion, unspecified: Secondary | ICD-10-CM

## 2015-10-08 NOTE — Assessment & Plan Note (Signed)
Continues to have a dysfunction noted. We will continue to monitor. Continue patient to work on core strength especially the hip abductors. We did discuss proper shoes that I think will be more beneficial. We discussed icing regimen. Discussed which activities to do in which ones to avoid. Patient will come back and see me again in 6 weeks for further evaluation and treatment.

## 2015-10-08 NOTE — Progress Notes (Signed)
Pre visit review using our clinic review tool, if applicable. No additional management support is needed unless otherwise documented below in the visit note. 

## 2015-10-08 NOTE — Assessment & Plan Note (Signed)
Decision today to treat with OMT was based on Physical Exam  After verbal consent patient was treated with HVLA, ME techniques in crevical, thoracic, and lumbar areas  Patient tolerated the procedure well with improvement in symptoms  Patient given exercises, stretches and lifestyle modifications  See medications in patient instructions if given  Patient will follow up in 6 weeks

## 2015-10-08 NOTE — Patient Instructions (Signed)
Sorry for the job ;( Keep trucking along.  Ice when you need it Keep working on the posture Stay active and work on the core strength  See me again in 6 weeks.

## 2015-10-08 NOTE — Progress Notes (Signed)
[name] [address], [place] [address] Phone: [phone] Subjective:     CC: Low back pain follow up  [id number] Susan Burch is a 60 y.o. female coming in with complaint of low back pain. Patient was found to have more of a sacroiliac joint dysfunction.. Patient is been doing somewhat better. Some mild increase in stress at work seems to be exacerbating some of her problems. Nothing that is severe though. Denies any radiation in the legs. Just tightness of the mid and lower back. Patient has not been doing exercises as regularly but continues to try to take the vitamins regularly.   No past medical history on file.  Patient Active Problem List   Diagnosis Date Noted  . Routine general medical examination at a health care facility 05/26/2015  . Metatarsalgia 05/13/2015  . Muscle strain of right gluteal region 11/19/2014  . Sacroiliac joint dysfunction of right side 08/11/2014  . Nonallopathic lesion of sacral region 08/11/2014  . Nonallopathic lesion of thoracic region 08/11/2014  . Nonallopathic lesion of lumbosacral region 08/11/2014  . Postmenopausal disorder 02/14/2013  . Sleep initiation dysfunction 09/27/2011  . ADVERSE DRUG REACTION, SULFA 08/12/2009  . DERMATITIS, CONTACT, NOS 11/28/2006  . PAIN IN JOINT, LOWER LEG 11/28/2006  . Hypothyroidism 11/06/2006  . ALLERGIC RHINITIS 11/06/2006  . GERD 11/06/2006    No past surgical history on file. No family history on file.denies any family history of rheumatoid arthritis Social History   Social History  . Marital Status: Married    Spouse Name: N/A  . Number of Children: N/A  . Years of Education: N/A   Occupational History  . Not on file.   Social History Main Topics  . Smoking status: Former Smoker -- 0.50 packs/day for 4 years    Types: Cigarettes    Quit date: 07/12/2004  . Smokeless tobacco: Not on file  . Alcohol Use: Not on file  . Drug Use: Not on file  .  Sexual Activity: Not on file   Other Topics Concern  . Not on file   Social History Narrative   Allergies  Allergen Reactions  . Bactrim   . Penicillins     Past medical history, social, surgical and family history all reviewed in electronic medical record.   Review of Systems: No headache, visual changes, nausea, vomiting, diarrhea, constipation, dizziness, abdominal pain, skin rash, fevers, chills, night sweats, weight loss, swollen lymph nodes, body aches, joint swelling, muscle aches, chest pain, shortness of breath, mood changes.   Objective Blood pressure 116/82, pulse 82, weight 204 lb (92.534 kg), SpO2 98 %.  General: No apparent distress alert and oriented x3 mood and affect normal, dressed appropriately.  HEENT: Pupils equal, extraocular movements intact  Respiratory: Patient's speak in full sentences and does not appear short of breath  Cardiovascular: No lower extremity edema, non tender, no erythema  Skin: Warm dry intact with no signs of infection or rash on extremities or on axial skeleton.  Abdomen: Soft nontender  Neuro: Cranial nerves II through XII are intact, neurovascularly intact in all extremities with 2+ DTRs and 2+ pulses.  Lymph: No lymphadenopathy of posterior or anterior cervical chain or axillae bilaterally.  Gait normal with good balance and coordination.  MSK:  Non tender with full range of motion and good stability and symmetric strength and tone of shoulders, elbows, wrist, hip, knee and ankles bilaterally.    Back Exam:  Inspection: Unremarkable  Motion: Flexion 35  deg, Extension 45 deg, Side Bending to 45 deg bilaterally,  Rotation to 45 deg bilaterally  SLR laying: Negative  XSLR laying: Negative  Palpable tenderness:Continued discomfort over the right sacroiliac joint. No significant improvement from previous exam. FABER: Positive right. Sensory change: Gross sensation intact to all lumbar and sacral dermatomes.  Reflexes: 2+ at both  patellar tendons, 2+ at achilles tendons, Babinski's downgoing.  Strength at foot  Plantar-flexion: 5/5 Dorsi-flexion: 5/5 Eversion: 5/5 Inversion: 5/5  Leg strength  Quad: 5/5 Hamstring: 5/5 Hip flexor: 5/5 Hip abductors: 4/5  Gait unremarkable.  Osteopathic findings Cervical  C2 flexed rotated and side bent right  Thoracic T5 extended rotated and side bent left T7 extended rotated inside that right  Lumbar L2 flexed rotated and side bent right  Sacrum Left on left      Impression and Recommendations:     This case required medical decision making of moderate complexity.

## 2015-11-02 MED FILL — ZOLPIDEM TARTRATE 5 MG TAB: 5 | 90 days supply | Qty: 90 | Fill #1

## 2015-11-19 ENCOUNTER — Ambulatory Visit: Payer: 59 | Admitting: Family Medicine

## 2015-11-25 NOTE — Progress Notes (Signed)
[facility] [address] [place], [place] [address] Phone: [phone] Subjective:     CC: Low back pain follow up  [id number]  Susan Burch is a 60 y.o. female coming in with complaint of low back pain. Patient was found to have more of a sacroiliac joint dysfunction.. Seems to be stable at this time. Still having some mild back pain. Seems to be when he is lifting other people at work. Denies any radiation down the legs. States that his does continue to seem chronic. Patient is having to work more hours which is making it a little more difficult as well. States she is having some mild increase in pain at night.   No past medical history on file.  Patient Active Problem List   Diagnosis Date Noted  . Routine general medical examination at a health care facility 05/26/2015  . Metatarsalgia 05/13/2015  . Muscle strain of right gluteal region 11/19/2014  . Sacroiliac joint dysfunction of right side 08/11/2014  . Nonallopathic lesion of sacral region 08/11/2014  . Nonallopathic lesion of thoracic region 08/11/2014  . Nonallopathic lesion of lumbosacral region 08/11/2014  . Postmenopausal disorder 02/14/2013  . Sleep initiation dysfunction 09/27/2011  . ADVERSE DRUG REACTION, SULFA 08/12/2009  . DERMATITIS, CONTACT, NOS 11/28/2006  . PAIN IN JOINT, LOWER LEG 11/28/2006  . Hypothyroidism 11/06/2006  . ALLERGIC RHINITIS 11/06/2006  . GERD 11/06/2006    No past surgical history on file. No family history on file.denies any family history of rheumatoid arthritis Social History   Social History  . Marital status: Married    Spouse name: N/A  . Number of children: N/A  . Years of education: N/A   Occupational History  . Not on file.   Social History Main Topics  . Smoking status: Former Smoker    Packs/day: 0.50    Years: 4.00    Types: Cigarettes    Quit date: 07/12/2004  . Smokeless tobacco: Not on file  . Alcohol use Not on file  . Drug  use: Unknown  . Sexual activity: Not on file   Other Topics Concern  . Not on file   Social History Narrative  . No narrative on file   Allergies  Allergen Reactions  . Bactrim   . Penicillins     Past medical history, social, surgical and family history all reviewed in electronic medical record.   Review of Systems: No headache, visual changes, nausea, vomiting, diarrhea, constipation, dizziness, abdominal pain, skin rash, fevers, chills, night sweats, weight loss, swollen lymph nodes,  chest pain, shortness of breath, mood changes.   Objective  Blood pressure 116/82, pulse 77, weight 205 lb (93 kg), SpO2 97 %.  General: No apparent distress alert and oriented x3 mood and affect normal, dressed appropriately.  HEENT: Pupils equal, extraocular movements intact  Respiratory: Patient's speak in full sentences and does not appear short of breath  Cardiovascular: No lower extremity edema, non tender, no erythema  Skin: Warm dry intact with no signs of infection or rash on extremities or on axial skeleton.  Abdomen: Soft nontender  Neuro: Cranial nerves II through XII are intact, neurovascularly intact in all extremities with 2+ DTRs and 2+ pulses.  Lymph: No lymphadenopathy of posterior or anterior cervical chain or axillae bilaterally.  Gait normal with good balance and coordination.  MSK:  Non tender with full range of motion and good stability and symmetric strength and tone of shoulders, elbows, wrist, hip, knee and  ankles bilaterally.    Back Exam:  Inspection: Unremarkable  Motion: Flexion 35 deg, Extension 25 deg, Side Bending to 45 deg bilaterally,  Rotation to 45 deg bilaterally  SLR laying: Negative  XSLR laying: Negative  Palpable tenderness:Continued pain over the right sacroiliac joint as well as the paraspinal musculature on the right side of the lumbar spine. FABER: Positive right. Sensory change: Gross sensation intact to all lumbar and sacral dermatomes.    Reflexes: 2+ at both patellar tendons, 2+ at achilles tendons, Babinski's downgoing.  Strength at foot  Plantar-flexion: 5/5 Dorsi-flexion: 5/5 Eversion: 5/5 Inversion: 5/5  Leg strength  Quad: 5/5 Hamstring: 5/5 Hip flexor: 5/5 Hip abductors: 4/5  Gait unremarkable.  Osteopathic findings Cervical  C2 flexed rotated and side bent right  Thoracic T5 extended rotated and side bent left T8 extended rotated inside that right  Lumbar L2 flexed rotated and side bent right  Sacrum Left on left      Impression and Recommendations:     This case required medical decision making of moderate complexity.

## 2015-11-26 ENCOUNTER — Encounter: Payer: Self-pay | Admitting: Family Medicine

## 2015-11-26 ENCOUNTER — Ambulatory Visit (INDEPENDENT_AMBULATORY_CARE_PROVIDER_SITE_OTHER): Payer: 59 | Admitting: Family Medicine

## 2015-11-26 VITALS — BP 116/82 | HR 77 | Wt 205.0 lb

## 2015-11-26 DIAGNOSIS — M9903 Segmental and somatic dysfunction of lumbar region: Secondary | ICD-10-CM

## 2015-11-26 DIAGNOSIS — M9902 Segmental and somatic dysfunction of thoracic region: Secondary | ICD-10-CM | POA: Diagnosis not present

## 2015-11-26 DIAGNOSIS — M533 Sacrococcygeal disorders, not elsewhere classified: Secondary | ICD-10-CM

## 2015-11-26 DIAGNOSIS — M9904 Segmental and somatic dysfunction of sacral region: Secondary | ICD-10-CM

## 2015-11-26 DIAGNOSIS — M999 Biomechanical lesion, unspecified: Secondary | ICD-10-CM

## 2015-11-26 MED ORDER — GABAPENTIN 100 MG PO CAPS: 200.0000 mg | ORAL_CAPSULE | Freq: Every day | ORAL | 3 refills | Status: DC

## 2015-11-26 MED FILL — GABAPENTIN 100 MG CAPSULE: 100 | 30 days supply | Qty: 60 | Fill #0

## 2015-11-26 NOTE — Assessment & Plan Note (Signed)
Decision today to treat with OMT was based on Physical Exam  After verbal consent patient was treated with HVLA, ME techniques in crevical, thoracic, and lumbar areas  Patient tolerated the procedure well with improvement in symptoms  Patient given exercises, stretches and lifestyle modifications  See medications in patient instructions if given  Patient will follow up in 4-6 weeks    

## 2015-11-26 NOTE — Patient Instructions (Addendum)
Great to see you as always.  You are doing well overall.  Core and posture are key  Gabapentin 200mg  at night.  If too groggy in AM then go down to 100mg .  DO NOT TAKE WITH AMBIEN AT FIRST! If not helping with the sleep then take ambien.  See me again in 4-5 weeks.

## 2015-11-26 NOTE — Assessment & Plan Note (Signed)
Continues to give her difficulty overall. We discussed icing regimen. Discussed core strength. Ergonomics as well as Midwife again discussed. Patient given gabapentin to help with nighttime pain. Hopefully this will help with some of her insomnia as well as. Follow-up again with me in 4-6 weeks.

## 2015-11-26 NOTE — Assessment & Plan Note (Signed)
Decision today to treat with OMT was based on Physical Exam  After verbal consent patient was treated with HVLA, ME techniques in crevical, thoracic, and lumbar areas  Patient tolerated the procedure well with improvement in symptoms  Patient given exercises, stretches and lifestyle modifications  See medications in patient instructions if given  Patient will follow up in 6 weeks

## 2015-12-17 MED FILL — SYNTHROID 175 MCG TABLET: 175 | 90 days supply | Qty: 90 | Fill #2

## 2016-01-06 ENCOUNTER — Encounter: Payer: Self-pay | Admitting: Family Medicine

## 2016-01-06 ENCOUNTER — Ambulatory Visit (INDEPENDENT_AMBULATORY_CARE_PROVIDER_SITE_OTHER): Payer: 59 | Admitting: Family Medicine

## 2016-01-06 VITALS — BP 114/80 | HR 69 | Wt 206.0 lb

## 2016-01-06 DIAGNOSIS — M999 Biomechanical lesion, unspecified: Secondary | ICD-10-CM

## 2016-01-06 DIAGNOSIS — M533 Sacrococcygeal disorders, not elsewhere classified: Secondary | ICD-10-CM

## 2016-01-06 DIAGNOSIS — M9902 Segmental and somatic dysfunction of thoracic region: Secondary | ICD-10-CM

## 2016-01-06 DIAGNOSIS — M9903 Segmental and somatic dysfunction of lumbar region: Secondary | ICD-10-CM

## 2016-01-06 DIAGNOSIS — M9904 Segmental and somatic dysfunction of sacral region: Secondary | ICD-10-CM

## 2016-01-06 MED ORDER — TIZANIDINE HCL 4 MG PO TABS: 4.0000 mg | ORAL_TABLET | Freq: Every evening | ORAL | 2 refills | Status: AC

## 2016-01-06 MED FILL — tiZANidine HCL 4 MG TABS: 4 | 30 days supply | Qty: 30 | Fill #0

## 2016-01-06 NOTE — Assessment & Plan Note (Signed)
Decision today to treat with OMT was based on Physical Exam  After verbal consent patient was treated with HVLA, ME techniques in crevical, thoracic, and lumbar areas  Patient tolerated the procedure well with improvement in symptoms  Patient given exercises, stretches and lifestyle modifications  See medications in patient instructions if given  Patient will follow up in 6 weeks

## 2016-01-06 NOTE — Assessment & Plan Note (Signed)
Continues to have some difficulty. Increasing muscle tightness recently. Given a muscle relaxer now to see if this will be beneficial. Encourage her to take gabapentin a more regular basis. Patient and will come back and see me again in 4-6 weeks for further evaluation and treatment.

## 2016-01-06 NOTE — Patient Instructions (Signed)
Good to see you.  Ice 20 minutes 2 times daily. Usually after activity and before bed. You are tight Duexis 2-3 times daily for 3 days.  Try the gabapentin 200mg  at night Stretch the hip flexors when you can.  If not helping can also take the zanaflex I am prescribing, this is a muscle relaxer See me again in 4-6 weeks.

## 2016-01-06 NOTE — Progress Notes (Signed)
[name] [place] [address] [place], [place] [address] Phone: [phone] Subjective:     CC: Low back pain follow up  [id number]  Susan Burch is a 60 y.o. female coming in with complaint of low back pain. Patient was found to have more of a sacroiliac joint dysfunction.. Seems to be stable at this time. Still having some mild back pain. Seems to be when he is lifting other people at work. Patient was also doing more housework recently and notices more tightness of the lower back. Was given gabapentin at last exam but did not take it regularly. No radiation down the leg just feels like she has tightness overall.   No past medical history on file.  Patient Active Problem List   Diagnosis Date Noted  . Routine general medical examination at a health care facility 05/26/2015  . Metatarsalgia 05/13/2015  . Muscle strain of right gluteal region 11/19/2014  . Sacroiliac joint dysfunction of right side 08/11/2014  . Nonallopathic lesion of sacral region 08/11/2014  . Nonallopathic lesion of thoracic region 08/11/2014  . Nonallopathic lesion of lumbosacral region 08/11/2014  . Postmenopausal disorder 02/14/2013  . Sleep initiation dysfunction 09/27/2011  . ADVERSE DRUG REACTION, SULFA 08/12/2009  . DERMATITIS, CONTACT, NOS 11/28/2006  . PAIN IN JOINT, LOWER LEG 11/28/2006  . Hypothyroidism 11/06/2006  . ALLERGIC RHINITIS 11/06/2006  . GERD 11/06/2006    No past surgical history on file. No family history on file.denies any family history of rheumatoid arthritis Social History   Social History  . Marital status: Married    Spouse name: N/A  . Number of children: N/A  . Years of education: N/A   Occupational History  . Not on file.   Social History Main Topics  . Smoking status: Former Smoker    Packs/day: 0.50    Years: 4.00    Types: Cigarettes    Quit date: 07/12/2004  . Smokeless tobacco: Not on file  . Alcohol use Not on file  . Drug  use: Unknown  . Sexual activity: Not on file   Other Topics Concern  . Not on file   Social History Narrative  . No narrative on file   Allergies  Allergen Reactions  . Bactrim   . Penicillins     Past medical history, social, surgical and family history all reviewed in electronic medical record.   Review of Systems: No headache, visual changes, nausea, vomiting, diarrhea, constipation, dizziness, abdominal pain, skin rash, fevers, chills, night sweats, weight loss, swollen lymph nodes,  chest pain, shortness of breath, mood changes.   Objective  Blood pressure 114/80, pulse 69, weight 206 lb (93.4 kg), SpO2 97 %.  General: No apparent distress alert and oriented x3 mood and affect normal, dressed appropriately.  HEENT: Pupils equal, extraocular movements intact  Respiratory: Patient's speak in full sentences and does not appear short of breath  Cardiovascular: No lower extremity edema, non tender, no erythema  Skin: Warm dry intact with no signs of infection or rash on extremities or on axial skeleton.  Abdomen: Soft nontender  Neuro: Cranial nerves II through XII are intact, neurovascularly intact in all extremities with 2+ DTRs and 2+ pulses.  Lymph: No lymphadenopathy of posterior or anterior cervical chain or axillae bilaterally.  Gait normal with good balance and coordination.  MSK:  Non tender with full range of motion and good stability and symmetric strength and tone of shoulders, elbows, wrist, hip, knee and ankles bilaterally.  Back Exam:  Inspection: Unremarkable  Motion: Flexion 35 deg, Extension 25 deg, Side Bending to 40 deg bilaterally,  Rotation to 40 deg bilaterally  SLR laying: Negative  XSLR laying: Negative  Palpable tenderness: Mild tenderness more over the paraspinal musculature on the right side than left side. Denies any radiation down the leg. FABER: Positive right. Still present Sensory change: Gross sensation intact to all lumbar and sacral  dermatomes.  Reflexes: 2+ at both patellar tendons, 2+ at achilles tendons, Babinski's downgoing.  Strength at foot  Plantar-flexion: 5/5 Dorsi-flexion: 5/5 Eversion: 5/5 Inversion: 5/5  Leg strength  Quad: 5/5 Hamstring: 5/5 Hip flexor: 5/5 Hip abductors: 4/5  Gait unremarkable.  Osteopathic findings Cervical  C2 flexed rotated and side bent right C6 flexed rotated and side bent left  Thoracic T5 extended rotated and side bent left T10 extended rotated inside that right  Lumbar L2 flexed rotated and side bent right L4 flexed rotated and side bent left   Sacrum Left on left      Impression and Recommendations:     This case required medical decision making of moderate complexity.

## 2016-02-02 ENCOUNTER — Other Ambulatory Visit: Payer: Self-pay | Admitting: Family Medicine

## 2016-02-02 DIAGNOSIS — E038 Other specified hypothyroidism: Secondary | ICD-10-CM

## 2016-02-02 DIAGNOSIS — Z0000 Encounter for general adult medical examination without abnormal findings: Secondary | ICD-10-CM

## 2016-02-02 DIAGNOSIS — G4700 Insomnia, unspecified: Secondary | ICD-10-CM

## 2016-02-03 MED FILL — ZOLPIDEM TARTRATE 5 MG TAB: 5 | 90 days supply | Qty: 90 | Fill #0

## 2016-02-10 ENCOUNTER — Ambulatory Visit: Payer: 59 | Admitting: Family Medicine

## 2016-02-17 NOTE — Progress Notes (Signed)
[name] [place], [place] [address] Phone: [phone] Subjective:     CC: Low back pain follow up  [id number]  TURNER HOUSEMAN is a 60 y.o. female coming in with complaint of low back pain. Patient was found to have more of a sacroiliac joint dysfunction.. Patient. Was having somewhat of an exacerbation at last exam. Patient was started on gabapentin to help her with nighttime pain. Patient was also given a muscle relaxer. Patient states Overall she seems to be doing relatively better. Still having some tightness mostly of the upper back. Did start the gabapentin. Unable to tolerate the 200 mg but does well in the 100 mg. No new symptoms. Feels like she is having more better days and worse days.   No past medical history on file.  Patient Active Problem List   Diagnosis Date Noted  . Routine general medical examination at a health care facility 05/26/2015  . Metatarsalgia 05/13/2015  . Muscle strain of right gluteal region 11/19/2014  . Sacroiliac joint dysfunction of right side 08/11/2014  . Nonallopathic lesion of sacral region 08/11/2014  . Nonallopathic lesion of thoracic region 08/11/2014  . Nonallopathic lesion of lumbosacral region 08/11/2014  . Postmenopausal disorder 02/14/2013  . Sleep initiation dysfunction 09/27/2011  . ADVERSE DRUG REACTION, SULFA 08/12/2009  . DERMATITIS, CONTACT, NOS 11/28/2006  . PAIN IN JOINT, LOWER LEG 11/28/2006  . Hypothyroidism 11/06/2006  . ALLERGIC RHINITIS 11/06/2006  . GERD 11/06/2006    No past surgical history on file. No family history on file.denies any family history of rheumatoid arthritis Social History   Social History  . Marital status: Married    Spouse name: N/A  . Number of children: N/A  . Years of education: N/A   Occupational History  . Not on file.   Social History Main Topics  . Smoking status: Former Smoker    Packs/day: 0.50    Years: 4.00    Types: Cigarettes     Quit date: 07/12/2004  . Smokeless tobacco: Not on file  . Alcohol use Not on file  . Drug use: Unknown  . Sexual activity: Not on file   Other Topics Concern  . Not on file   Social History Narrative  . No narrative on file   Allergies  Allergen Reactions  . Bactrim   . Penicillins    No Family history for rheumatologic disorders  Past medical history, social, surgical and family history all reviewed in electronic medical record.   Review of Systems: No headache, visual changes, nausea, vomiting, diarrhea, constipation, dizziness, abdominal pain, skin rash, fevers, chills, night sweats, weight loss, swollen lymph nodes,  chest pain, shortness of breath.  Objective  Blood pressure 122/84, pulse 69, weight 207 lb (93.9 kg), SpO2 97 %.  General: No apparent distress alert and oriented x3 mood and affect normal, dressed appropriately.  HEENT: Pupils equal, extraocular movements intact  Respiratory: Patient's speak in full sentences and does not appear short of breath  Cardiovascular: No lower extremity edema, non tender, no erythema  Skin: Warm dry intact with no signs of infection or rash on extremities or on axial skeleton.  Abdomen: Soft nontender  Neuro: Cranial nerves II through XII are intact, neurovascularly intact in all extremities with 2+ DTRs and 2+ pulses.  Lymph: No lymphadenopathy of posterior or anterior cervical chain or axillae bilaterally.  Gait normal with good balance and coordination.  MSK:  Non tender with full range of motion  and good stability and symmetric strength and tone of shoulders, elbows, wrist, hip, knee and ankles bilaterally.    Back Exam:  Inspection: Unremarkable  Motion: Flexion 35 deg, Extension 15 deg, Side Bending to 30 deg bilaterally,  Rotation to 30 deg bilaterally  SLR laying: Negative  XSLR laying: Negative  Palpable tenderness: Mild to moderate tenderness in the thoracolumbar juncture bilaterally FABER: Positive right. Mild  improvement  Sensory change: Gross sensation intact to all lumbar and sacral dermatomes.  Reflexes: 2+ at both patellar tendons, 2+ at achilles tendons, Babinski's downgoing.  Strength at foot  Plantar-flexion: 5/5 Dorsi-flexion: 5/5 Eversion: 5/5 Inversion: 5/5  Leg strength  Quad: 5/5 Hamstring: 5/5 Hip flexor: 5/5 Hip abductors: 4/5 no change in strength  Gait unremarkable.  Osteopathic findings Cervical  C2 flexed rotated and side bent right C4 flexed rotated and side bent left  Thoracic T5 extended rotated and side bent left T7 extended rotated inside that right  Lumbar L2 flexed rotated and side bent right L4 flexed rotated and side bent left   Sacrum Left on left      Impression and Recommendations:     This case required medical decision making of moderate complexity.

## 2016-02-18 ENCOUNTER — Ambulatory Visit (INDEPENDENT_AMBULATORY_CARE_PROVIDER_SITE_OTHER): Payer: 59 | Admitting: Family Medicine

## 2016-02-18 ENCOUNTER — Encounter: Payer: Self-pay | Admitting: Family Medicine

## 2016-02-18 VITALS — BP 122/84 | HR 69 | Wt 207.0 lb

## 2016-02-18 DIAGNOSIS — M533 Sacrococcygeal disorders, not elsewhere classified: Secondary | ICD-10-CM

## 2016-02-18 DIAGNOSIS — M999 Biomechanical lesion, unspecified: Secondary | ICD-10-CM | POA: Diagnosis not present

## 2016-02-18 NOTE — Assessment & Plan Note (Signed)
Decision today to treat with OMT was based on Physical Exam  After verbal consent patient was treated with HVLA, ME techniques in crevical, thoracic, and lumbar areas  Patient tolerated the procedure well with improvement in symptoms  Patient given exercises, stretches and lifestyle modifications  See medications in patient instructions if given  Patient will follow up in 6-7 weeks  Otherwise reiterated continue conservative therapy

## 2016-02-18 NOTE — Patient Instructions (Signed)
Good to see you  Gabapentin 100mg  at night is fine Keep it up and continue to watch the neck and the posture.  I am here if you need me sooner Otherwise see me again in 6-7 weeks.

## 2016-02-18 NOTE — Assessment & Plan Note (Signed)
Patient continues to have discomfort in this area. I do believe that posture is also a big one as well as core strength. We encouraged her to continue to work on regular intervals at this time. We discussed icing regimen, home exercise, which activities doing which ones to potentially avoid. Patient will continue to stay active. Patient will come back and see me again in 6-7 weeks.

## 2016-03-30 ENCOUNTER — Telehealth: Payer: Self-pay | Admitting: Family Medicine

## 2016-03-30 NOTE — Progress Notes (Signed)
[name] [place], [place] [address] Phone: [phone] Subjective:     CC: Low back pain follow up  [id number]  Susan Burch is a 60 y.o. female coming in with complaint of low back pain. Patient was found to have more of a sacroiliac joint dysfunction.. Patient was doing better. Has discontinued the gabapentin as well as a muscle relaxer. States that the ibuprofen seems to be helpful when needed. Overall continues to be active. Mild tightness of the upper back as well. Stricture exercises.   No past medical history on file.  Patient Active Problem List   Diagnosis Date Noted  . Routine general medical examination at a health care facility 05/26/2015  . Metatarsalgia 05/13/2015  . Muscle strain of right gluteal region 11/19/2014  . Sacroiliac joint dysfunction of right side 08/11/2014  . Nonallopathic lesion of sacral region 08/11/2014  . Nonallopathic lesion of thoracic region 08/11/2014  . Nonallopathic lesion of lumbosacral region 08/11/2014  . Postmenopausal disorder 02/14/2013  . Sleep initiation dysfunction 09/27/2011  . ADVERSE DRUG REACTION, SULFA 08/12/2009  . DERMATITIS, CONTACT, NOS 11/28/2006  . PAIN IN JOINT, LOWER LEG 11/28/2006  . Hypothyroidism 11/06/2006  . ALLERGIC RHINITIS 11/06/2006  . GERD 11/06/2006    No past surgical history on file. No family history on file.denies any family history of rheumatoid arthritis Social History   Social History  . Marital status: Married    Spouse name: N/A  . Number of children: N/A  . Years of education: N/A   Occupational History  . Not on file.   Social History Main Topics  . Smoking status: Former Smoker    Packs/day: 0.50    Years: 4.00    Types: Cigarettes    Quit date: 07/12/2004  . Smokeless tobacco: Not on file  . Alcohol use Not on file  . Drug use: Unknown  . Sexual activity: Not on file   Other Topics Concern  . Not on file   Social History  Narrative  . No narrative on file   Allergies  Allergen Reactions  . Bactrim   . Penicillins    No Family history for rheumatologic disorders  Past medical history, social, surgical and family history all reviewed in electronic medical record.   Review of Systems: No headache, visual changes, nausea, vomiting, diarrhea, constipation, dizziness, abdominal pain, skin rash, fevers, chills, night sweats, weight loss, swollen lymph nodes,  chest pain, shortness of breath.  Objective  Blood pressure 122/84, pulse 81, height 5' 2.5" (1.588 m), weight 211 lb (95.7 kg), SpO2 98 %.  Systems examined below as of 03/31/16 General: NAD A&O x3 mood, affect normal  HEENT: Pupils equal, extraocular movements intact no nystagmus Respiratory: not short of breath at rest or with speaking Cardiovascular: No lower extremity edema, non tender Skin: Warm dry intact with no signs of infection or rash on extremities or on axial skeleton. Abdomen: Soft nontender, no masses Neuro: Cranial nerves  intact, neurovascularly intact in all extremities with 2+ DTRs and 2+ pulses. Lymph: No lymphadenopathy appreciated today  Gait normal with good balance and coordination.  MSK: Non tender with full range of motion and good stability and symmetric strength and tone of shoulders, elbows, wrist,  knee hips and ankles bilaterally.      Back Exam:  Inspection: Unremarkable  Motion: Flexion 30 deg, Extension 15 deg, Side Bending to 30 deg bilaterally,  Rotation to 30 deg bilaterally  SLR laying: Negative  XSLR laying: Negative  Palpable tenderness: Increasing tenderness of the thoracal lumbar junction on the left sign FABER: Positive right. No significant change  Sensory change: Gross sensation intact to all lumbar and sacral dermatomes.  Reflexes: 2+ at both patellar tendons, 2+ at achilles tendons, Babinski's downgoing.  Strength at foot  Plantar-flexion: 5/5 Dorsi-flexion: 5/5 Eversion: 5/5 Inversion: 5/5  Leg  strength  Quad: 5/5 Hamstring: 5/5 Hip flexor: 5/5 Hip abductors: 4/5 but symmetric Gait unremarkable.  Osteopathic findings Cervical  C2 flexed rotated and side bent right C5 flexed rotated and side bent left  Thoracic T5 extended rotated and side bent left T8 extended rotated inside that right  Lumbar L2 flexed rotated and side bent right L5 flexed rotated and side bent left   Sacrum Left on left    Impression and Recommendations:     This case required medical decision making of moderate complexity.

## 2016-03-30 NOTE — Telephone Encounter (Signed)
Called and spoke with patient informing her that Dr Sherren Mocha is out of the office until Monday will address her concerns upon his return. Patient verbalized understanding

## 2016-03-30 NOTE — Telephone Encounter (Signed)
Pt husband tested positive for  Hep c. Can I sch pt for Hep c?

## 2016-03-31 ENCOUNTER — Encounter: Payer: Self-pay | Admitting: Family Medicine

## 2016-03-31 ENCOUNTER — Ambulatory Visit (INDEPENDENT_AMBULATORY_CARE_PROVIDER_SITE_OTHER): Payer: 59 | Admitting: Family Medicine

## 2016-03-31 VITALS — BP 122/84 | HR 81 | Ht 62.5 in | Wt 211.0 lb

## 2016-03-31 DIAGNOSIS — M533 Sacrococcygeal disorders, not elsewhere classified: Secondary | ICD-10-CM | POA: Diagnosis not present

## 2016-03-31 DIAGNOSIS — M999 Biomechanical lesion, unspecified: Secondary | ICD-10-CM | POA: Diagnosis not present

## 2016-03-31 NOTE — Assessment & Plan Note (Signed)
Decision today to treat with OMT was based on Physical Exam  After verbal consent patient was treated with HVLA, ME techniques in crevical, thoracic, and lumbar areas  Patient tolerated the procedure well with improvement in symptoms  Patient given exercises, stretches and lifestyle modifications  See medications in patient instructions if given  Patient will follow up in 6-10 weeks  Otherwise reiterated continue conservative therapy

## 2016-03-31 NOTE — Assessment & Plan Note (Signed)
Seems to be doing relatively well. No cement change in management at this time. Encourage patient to do the exercises on areolar basis. Encourage weight loss. Patient continue be active. Follow-up again in 6-10 weeks

## 2016-03-31 NOTE — Patient Instructions (Signed)
Good to see you.  Ice still can help Duexis 3 times daily for 3 days with flares Keep being active See me again in 6-10 weeks [place]!

## 2016-04-04 ENCOUNTER — Other Ambulatory Visit: Payer: Self-pay | Admitting: Emergency Medicine

## 2016-04-04 DIAGNOSIS — T7589XA Other specified effects of external causes, initial encounter: Secondary | ICD-10-CM

## 2016-04-04 NOTE — Telephone Encounter (Signed)
Pt is aware order has been placed. Nothing further needed at this time.

## 2016-04-04 NOTE — Telephone Encounter (Signed)
Called and left voicemail for pt to return call to office.  Please inform pt that lab order has been placed.

## 2016-04-07 ENCOUNTER — Other Ambulatory Visit (INDEPENDENT_AMBULATORY_CARE_PROVIDER_SITE_OTHER): Payer: 59

## 2016-04-07 DIAGNOSIS — T7589XA Other specified effects of external causes, initial encounter: Secondary | ICD-10-CM | POA: Diagnosis not present

## 2016-04-07 MED FILL — SYNTHROID 175 MCG TABLET: 175 | 90 days supply | Qty: 90 | Fill #3

## 2016-04-08 LAB — HEPATITIS C ANTIBODY: HCV AB: NEGATIVE

## 2016-04-22 ENCOUNTER — Telehealth: Payer: Self-pay | Admitting: Family Medicine

## 2016-04-22 NOTE — Telephone Encounter (Signed)
Pt would like results of hep labs. Please call back.

## 2016-04-26 NOTE — Telephone Encounter (Signed)
Pt would like lab results.  

## 2016-05-02 NOTE — Telephone Encounter (Signed)
Please call hepatitis C screening test was negative

## 2016-05-02 NOTE — Telephone Encounter (Signed)
Left pt a message to give the office a call back in reference to test results

## 2016-05-03 ENCOUNTER — Other Ambulatory Visit: Payer: Self-pay | Admitting: Emergency Medicine

## 2016-05-03 NOTE — Telephone Encounter (Signed)
Spoke with pt and gave results. Nothing further needed.  

## 2016-05-04 ENCOUNTER — Other Ambulatory Visit: Payer: Self-pay | Admitting: Family Medicine

## 2016-05-04 MED FILL — ZOLPIDEM TARTRATE 5 MG TAB: 5 | 90 days supply | Qty: 90 | Fill #1

## 2016-05-04 MED FILL — SYMBICORT 160-4.5 MCG INH: 160-4.5 | 30 days supply | Qty: 10 | Fill #0

## 2016-05-05 DIAGNOSIS — L570 Actinic keratosis: Secondary | ICD-10-CM | POA: Diagnosis not present

## 2016-05-05 DIAGNOSIS — L7211 Pilar cyst: Secondary | ICD-10-CM | POA: Diagnosis not present

## 2016-05-11 ENCOUNTER — Other Ambulatory Visit: Payer: 59

## 2016-05-16 MED FILL — AZITHROMYCIN 250 MG TABLET: 250 | 5 days supply | Qty: 6 | Fill #0

## 2016-05-18 ENCOUNTER — Encounter: Payer: 59 | Admitting: Family Medicine

## 2016-05-19 DIAGNOSIS — L7211 Pilar cyst: Secondary | ICD-10-CM | POA: Diagnosis not present

## 2016-05-26 ENCOUNTER — Ambulatory Visit: Payer: 59 | Admitting: Family Medicine

## 2016-06-02 MED FILL — tiZANidine HCL 4 MG TABS: 4 | 30 days supply | Qty: 30 | Fill #1

## 2016-06-24 ENCOUNTER — Other Ambulatory Visit: Payer: 59

## 2016-06-29 ENCOUNTER — Encounter: Payer: 59 | Admitting: Family Medicine

## 2016-07-06 ENCOUNTER — Other Ambulatory Visit: Payer: Self-pay | Admitting: Family Medicine

## 2016-07-07 ENCOUNTER — Ambulatory Visit (INDEPENDENT_AMBULATORY_CARE_PROVIDER_SITE_OTHER)
Admission: RE | Admit: 2016-07-07 | Discharge: 2016-07-07 | Disposition: A | Discharge status: Home / Self Care | Payer: 59 | Source: Ambulatory Visit | Attending: Family Medicine | Admitting: Family Medicine

## 2016-07-07 ENCOUNTER — Encounter: Payer: Self-pay | Admitting: Family Medicine

## 2016-07-07 ENCOUNTER — Ambulatory Visit (INDEPENDENT_AMBULATORY_CARE_PROVIDER_SITE_OTHER): Payer: 59 | Admitting: Family Medicine

## 2016-07-07 VITALS — BP 114/84 | HR 78 | Ht 62.5 in | Wt 209.2 lb

## 2016-07-07 DIAGNOSIS — M533 Sacrococcygeal disorders, not elsewhere classified: Secondary | ICD-10-CM

## 2016-07-07 DIAGNOSIS — M545 Low back pain, unspecified: Secondary | ICD-10-CM

## 2016-07-07 DIAGNOSIS — M999 Biomechanical lesion, unspecified: Secondary | ICD-10-CM | POA: Diagnosis not present

## 2016-07-07 MED ORDER — VENLAFAXINE HCL ER 37.5 MG PO CP24: 37.5000 mg | ORAL_CAPSULE | Freq: Every day | ORAL | 1 refills | Status: DC

## 2016-07-07 MED FILL — SYNTHROID 175 MCG TABLET: 175 | 90 days supply | Qty: 90 | Fill #0

## 2016-07-07 MED FILL — VENLAFAXINE HCL ER 37.5 MG: 37.5 | 30 days supply | Qty: 30 | Fill #0

## 2016-07-07 NOTE — Assessment & Plan Note (Signed)
Patient continues to have pain. Not responding as well to conservative therapy. Patient is having some radicular symptoms that seems to be new as well. X-rays ordered today for further evaluation. Patient may need MRI for any weakness occurs or if this numbness becomes constant. Attempted osteopathic manipulation again. Started on Effexor. Warned of potential side effects. Follow-up again in 3-4 weeks.

## 2016-07-07 NOTE — Patient Instructions (Signed)
Good to see you  Ice is your friend  Effexor 37.5 mg daily  Xray downstairs today  See me again in 4 weeks.

## 2016-07-07 NOTE — Progress Notes (Signed)
[name] [address] [place], [place] [address] Phone: [phone] Subjective:     CC: Low back pain follow up  [id number]  Susan Burch is a 61 y.o. female coming in with complaint of low back pain. Patient was found to have more of a sacroiliac joint dysfunction.. Patient was doing better. Has discontinued the gabapentin as well as a muscle relaxer. States that the ibuprofen seems to be helpful when needed. Overall continues to be active. Mild tightness of the upper back as well. Patient is concerned because she does not feel like she is making as much progress as she should. Still hurts her even at work. Sometimes can keep her up at night.   History reviewed. No pertinent past medical history.  Patient Active Problem List   Diagnosis Date Noted  . Routine general medical examination at a health care facility 05/26/2015  . Metatarsalgia 05/13/2015  . Muscle strain of right gluteal region 11/19/2014  . Sacroiliac joint dysfunction of right side 08/11/2014  . Nonallopathic lesion of sacral region 08/11/2014  . Nonallopathic lesion of thoracic region 08/11/2014  . Nonallopathic lesion of lumbosacral region 08/11/2014  . Postmenopausal disorder 02/14/2013  . Sleep initiation dysfunction 09/27/2011  . ADVERSE DRUG REACTION, SULFA 08/12/2009  . DERMATITIS, CONTACT, NOS 11/28/2006  . PAIN IN JOINT, LOWER LEG 11/28/2006  . Hypothyroidism 11/06/2006  . ALLERGIC RHINITIS 11/06/2006  . GERD 11/06/2006    History reviewed. No pertinent surgical history. History reviewed. No pertinent family history.denies any family history of rheumatoid arthritis Social History   Social History  . Marital status: Married    Spouse name: N/A  . Number of children: N/A  . Years of education: N/A   Occupational History  . Not on file.   Social History Main Topics  . Smoking status: Former Smoker    Packs/day: 0.50    Years: 4.00    Types: Cigarettes    Quit  date: 07/12/2004  . Smokeless tobacco: Never Used  . Alcohol use Not on file  . Drug use: Unknown  . Sexual activity: Not on file   Other Topics Concern  . Not on file   Social History Narrative  . No narrative on file   Allergies  Allergen Reactions  . Bactrim   . Penicillins    No Family history for rheumatologic disorders  Past medical history, social, surgical and family history all reviewed in electronic medical record.   Review of Systems: No headache, visual changes, nausea, vomiting, diarrhea, constipation, dizziness, abdominal pain, skin rash, fevers, chills, night sweats, weight loss, swollen lymph nodes, chest pain, shortness of breath, mood changes.  Marland Kitchen Positive muscle and body aches  Objective  Blood pressure 114/84, pulse 78, height 5' 2.5" (1.588 m), weight 209 lb 3.2 oz (94.9 kg), SpO2 98 %.  Systems examined below as of 07/07/16 General: NAD A&O x3 mood, affect normal  HEENT: Pupils equal, extraocular movements intact no nystagmus Respiratory: not short of breath at rest or with speaking Cardiovascular: No lower extremity edema, non tender Skin: Warm dry intact with no signs of infection or rash on extremities or on axial skeleton. Abdomen: Soft nontender, no masses Neuro: Cranial nerves  intact, neurovascularly intact in all extremities with 2+ DTRs and 2+ pulses. Lymph: No lymphadenopathy appreciated today  Gait normal with good balance and coordination.  MSK: Non tender with full range of motion and good stability and symmetric strength and tone of shoulders, elbows, wrist,  knee hips and ankles bilaterally.      Back Exam:  Inspection: Unremarkable  Motion: Flexion 30 deg, Extension 15 degWorsening discomfort, Side Bending to 30 deg bilaterally,  Rotation to 30 deg bilaterally  SLR laying: Negative significant tightness of the hamstrings bilaterally. XSLR laying: Negative  Palpable tenderness: Increasing tenderness of the thoracal lumbar junction on the  left sign FABER: Continued tightness  Sensory change: Gross sensation intact to all lumbar and sacral dermatomes.  Reflexes: 2+ at both patellar tendons, 2+ at achilles tendons, Babinski's downgoing.  Strength at foot  Plantar-flexion: 5/5 Dorsi-flexion: 5/5 Eversion: 5/5 Inversion: 5/5  Leg strength  Quad: 5/5 Hamstring: 5/5 Hip flexor: 5/5 Hip abductors: 4/5 but symmetric Gait unremarkable.  Osteopathic findings Cervical C2 flexed rotated and side bent right C4 flexed rotated and side bent left C6 flexed rotated and side bent left T3 extended rotated and side bent right inhaled third rib T9 extended rotated and side bent left L2 flexed rotated and side bent right Sacrum right on right    Impression and Recommendations:     This case required medical decision making of moderate complexity.

## 2016-07-07 NOTE — Assessment & Plan Note (Signed)
Decision today to treat with OMT was based on Physical Exam  After verbal consent patient was treated with HVLA, ME, FPR techniques in cervical, thoracic, lumbar and sacral areas  Patient tolerated the procedure well with improvement in symptoms  Patient given exercises, stretches and lifestyle modifications  See medications in patient instructions if given  Patient will follow up in 3-4 weeks  

## 2016-08-01 MED FILL — ZOLPIDEM TARTRATE 5 MG TAB: 5 | 90 days supply | Qty: 90 | Fill #2

## 2016-08-02 ENCOUNTER — Other Ambulatory Visit: Payer: 59

## 2016-08-09 ENCOUNTER — Encounter: Payer: 59 | Admitting: Family Medicine

## 2016-08-10 ENCOUNTER — Encounter: Payer: 59 | Admitting: Family Medicine

## 2016-08-11 ENCOUNTER — Ambulatory Visit: Payer: 59 | Admitting: Family Medicine

## 2016-08-30 NOTE — Progress Notes (Signed)
[name] [place], [place] [address] Phone: [phone] Subjective:     CC: Low back pain follow up  OJJ:[id number]  Susan Burch is a 61 y.o. female coming in with complaint of low back pain. Patient was found to have more of a sacroiliac joint dysfunction.. Patient was doing better. Has discontinued the gabapentin as well as a muscle relaxer. States that the ibuprofen seems to be helpful when needed. Once again another improvement.  Still though tight after working, no radicular symptoms, Just tightness, sometimes uncomfortable at night   No past medical history on file.  Patient Active Problem List   Diagnosis Date Noted  . Routine general medical examination at a health care facility 05/26/2015  . Metatarsalgia 05/13/2015  . Muscle strain of right gluteal region 11/19/2014  . Sacroiliac joint dysfunction of right side 08/11/2014  . Nonallopathic lesion of sacral region 08/11/2014  . Nonallopathic lesion of thoracic region 08/11/2014  . Nonallopathic lesion of lumbosacral region 08/11/2014  . Postmenopausal disorder 02/14/2013  . Sleep initiation dysfunction 09/27/2011  . ADVERSE DRUG REACTION, SULFA 08/12/2009  . DERMATITIS, CONTACT, NOS 11/28/2006  . PAIN IN JOINT, LOWER LEG 11/28/2006  . Hypothyroidism 11/06/2006  . ALLERGIC RHINITIS 11/06/2006  . GERD 11/06/2006    No past surgical history on file. No family history on file.denies any family history of rheumatoid arthritis Social History   Social History  . Marital status: Married    Spouse name: N/A  . Number of children: N/A  . Years of education: N/A   Occupational History  . Not on file.   Social History Main Topics  . Smoking status: Former Smoker    Packs/day: 0.50    Years: 4.00    Types: Cigarettes    Quit date: 07/12/2004  . Smokeless tobacco: Never Used  . Alcohol use Not on file  . Drug use: Unknown  . Sexual activity: Not on file   Other Topics  Concern  . Not on file   Social History Narrative  . No narrative on file   Allergies  Allergen Reactions  . Bactrim   . Penicillins    No Family history for rheumatologic disorders  Past medical history, social, surgical and family history all reviewed in electronic medical record.   Review of Systems: No headache, visual changes, nausea, vomiting, diarrhea, constipation, dizziness, abdominal pain, skin rash, fevers, chills, night sweats, weight loss, swollen lymph nodes, body aches, joint swelling, muscle aches, chest pain, shortness of breath, mood changes.    Objective  Blood pressure 116/82, pulse 83, resp. rate 16, weight 211 lb 2 oz (95.8 kg), SpO2 94 %.  Systems examined below as of 08/31/16 General: NAD A&O x3 mood, affect normal  HEENT: Pupils equal, extraocular movements intact no nystagmus Respiratory: not short of breath at rest or with speaking Cardiovascular: No lower extremity edema, non tender Skin: Warm dry intact with no signs of infection or rash on extremities or on axial skeleton. Abdomen: Soft nontender, no masses Neuro: Cranial nerves  intact, neurovascularly intact in all extremities with 2+ DTRs and 2+ pulses. Lymph: No lymphadenopathy appreciated today  Gait normal with good balance and coordination.  MSK: Non tender with full range of motion and good stability and symmetric strength and tone of shoulders, elbows, wrist,  knee hips and ankles bilaterally.      Back Exam:  Inspection: Unremarkable  Motion: Flexion 45 deg, Extension 25 deg, Side Bending to 45 deg  bilaterally,  Rotation to 45 deg bilaterally  SLR laying: Negative  XSLR laying: Negative  Palpable tenderness: tender to palpation paraspinal musculature of the lumbar spiine and CT juncture. Marland Kitchen FABER: negative. Sensory change: Gross sensation intact to all lumbar and sacral dermatomes.  Reflexes: 2+ at both patellar tendons, 2+ at achilles tendons, Babinski's downgoing.  Strength at foot    Plantar-flexion: 5/5 Dorsi-flexion: 5/5 Eversion: 5/5 Inversion: 5/5  Leg strength  Quad: 5/5 Hamstring: 5/5 Hip flexor: 5/5 Hip abductors: 4/5 but symmetric Gait unremarkable.   Osteopathic findings C4 flexed rotated and side bent left C6 flexed rotated and side bent left T3 extended rotated and side bent right inhaled third rib T8 extended rotated and side bent left L2 flexed rotated and side bent right Sacrum right on right     Impression and Recommendations:     This case required medical decision making of moderate complexity.

## 2016-08-31 ENCOUNTER — Ambulatory Visit (INDEPENDENT_AMBULATORY_CARE_PROVIDER_SITE_OTHER): Payer: 59 | Admitting: Family Medicine

## 2016-08-31 ENCOUNTER — Encounter: Payer: Self-pay | Admitting: Family Medicine

## 2016-08-31 VITALS — BP 116/82 | HR 83 | Resp 16 | Wt 211.1 lb

## 2016-08-31 DIAGNOSIS — M999 Biomechanical lesion, unspecified: Secondary | ICD-10-CM | POA: Diagnosis not present

## 2016-08-31 DIAGNOSIS — M533 Sacrococcygeal disorders, not elsewhere classified: Secondary | ICD-10-CM | POA: Diagnosis not present

## 2016-08-31 NOTE — Assessment & Plan Note (Addendum)
Decision today to treat with OMT was based on Physical Exam  After verbal consent patient was treated with HVLA, ME, FPR techniques in  thoracic, lumbar and sacral areas  Patient tolerated the procedure well with improvement in symptoms  Patient given exercises, stretches and lifestyle modifications  See medications in patient instructions if given  Patient will follow up in 4 weeks 

## 2016-08-31 NOTE — Assessment & Plan Note (Signed)
Stable Will monitor Responds to OMT Discussed topicals and ice RTC in 2-3 months

## 2016-08-31 NOTE — Patient Instructions (Signed)
You are amazing Keep it up  Don't change a thing See me again in 2-3 months!

## 2016-09-01 DIAGNOSIS — Z1151 Encounter for screening for human papillomavirus (HPV): Secondary | ICD-10-CM | POA: Diagnosis not present

## 2016-09-01 DIAGNOSIS — Z6837 Body mass index (BMI) 37.0-37.9, adult: Secondary | ICD-10-CM | POA: Diagnosis not present

## 2016-09-01 DIAGNOSIS — Z01419 Encounter for gynecological examination (general) (routine) without abnormal findings: Secondary | ICD-10-CM | POA: Diagnosis not present

## 2016-09-01 DIAGNOSIS — Z1231 Encounter for screening mammogram for malignant neoplasm of breast: Secondary | ICD-10-CM | POA: Diagnosis not present

## 2016-09-02 ENCOUNTER — Other Ambulatory Visit: Payer: Self-pay | Admitting: Obstetrics and Gynecology

## 2016-09-02 DIAGNOSIS — R928 Other abnormal and inconclusive findings on diagnostic imaging of breast: Secondary | ICD-10-CM

## 2016-09-08 ENCOUNTER — Ambulatory Visit
Admission: RE | Admit: 2016-09-08 | Discharge: 2016-09-08 | Disposition: A | Discharge status: Home / Self Care | Payer: 59 | Source: Ambulatory Visit | Attending: Obstetrics and Gynecology | Admitting: Obstetrics and Gynecology

## 2016-09-08 DIAGNOSIS — R928 Other abnormal and inconclusive findings on diagnostic imaging of breast: Secondary | ICD-10-CM | POA: Diagnosis not present

## 2016-09-08 DIAGNOSIS — N6312 Unspecified lump in the right breast, upper inner quadrant: Secondary | ICD-10-CM | POA: Diagnosis not present

## 2016-09-20 ENCOUNTER — Encounter: Payer: 59 | Admitting: Family Medicine

## 2016-09-21 ENCOUNTER — Encounter: Payer: Self-pay | Admitting: Family Medicine

## 2016-09-21 ENCOUNTER — Ambulatory Visit (INDEPENDENT_AMBULATORY_CARE_PROVIDER_SITE_OTHER): Payer: 59 | Admitting: Family Medicine

## 2016-09-21 VITALS — BP 136/88 | Temp 97.9°F | Ht 62.25 in | Wt 214.0 lb

## 2016-09-21 DIAGNOSIS — G4700 Insomnia, unspecified: Secondary | ICD-10-CM

## 2016-09-21 DIAGNOSIS — E038 Other specified hypothyroidism: Secondary | ICD-10-CM | POA: Diagnosis not present

## 2016-09-21 DIAGNOSIS — Z0000 Encounter for general adult medical examination without abnormal findings: Secondary | ICD-10-CM | POA: Diagnosis not present

## 2016-09-21 DIAGNOSIS — J302 Other seasonal allergic rhinitis: Secondary | ICD-10-CM | POA: Diagnosis not present

## 2016-09-21 DIAGNOSIS — J4530 Mild persistent asthma, uncomplicated: Secondary | ICD-10-CM | POA: Insufficient documentation

## 2016-09-21 DIAGNOSIS — J4520 Mild intermittent asthma, uncomplicated: Secondary | ICD-10-CM

## 2016-09-21 LAB — LIPID PANEL
CHOL/HDL RATIO: 5
Cholesterol: 240 mg/dL — ABNORMAL HIGH (ref 0–200)
HDL: 43.9 mg/dL (ref >39.00)
NONHDL: 196.28
TRIGLYCERIDES: 374 mg/dL — AB (ref 0.0–149.0)
VLDL: 74.8 mg/dL — ABNORMAL HIGH (ref 0.0–40.0)

## 2016-09-21 LAB — POCT URINALYSIS DIPSTICK
BILIRUBIN UA: NEGATIVE
Blood, UA: NEGATIVE
Glucose, UA: NEGATIVE
KETONES UA: NEGATIVE
LEUKOCYTES UA: NEGATIVE
Nitrite, UA: NEGATIVE
PH UA: 6 (ref 5.0–8.0)
PROTEIN UA: NEGATIVE
Spec Grav, UA: >=1.03 — AB (ref 1.010–1.025)
Urobilinogen, UA: 0.2 E.U./dL

## 2016-09-21 LAB — CBC WITH DIFFERENTIAL/PLATELET
BASOS PCT: 0.4 % (ref 0.0–3.0)
Basophils Absolute: 0 10*3/uL (ref 0.0–0.1)
EOS PCT: 1.2 % (ref 0.0–5.0)
Eosinophils Absolute: 0.1 10*3/uL (ref 0.0–0.7)
HCT: 45 % (ref 36.0–46.0)
Hemoglobin: 15.5 g/dL — ABNORMAL HIGH (ref 12.0–15.0)
LYMPHS ABS: 1.3 10*3/uL (ref 0.7–4.0)
Lymphocytes Relative: 19 % (ref 12.0–46.0)
MCHC: 34.4 g/dL (ref 30.0–36.0)
MCV: 93.6 fl (ref 78.0–100.0)
MONO ABS: 0.8 10*3/uL (ref 0.1–1.0)
MONOS PCT: 11.2 % (ref 3.0–12.0)
NEUTROS ABS: 4.7 10*3/uL (ref 1.4–7.7)
NEUTROS PCT: 68.2 % (ref 43.0–77.0)
PLATELETS: 270 10*3/uL (ref 150.0–400.0)
RBC: 4.8 Mil/uL (ref 3.87–5.11)
RDW: 14.2 % (ref 11.5–15.5)
WBC: 7 10*3/uL (ref 4.0–10.5)

## 2016-09-21 LAB — BASIC METABOLIC PANEL
BUN: 15 mg/dL (ref 6–23)
CHLORIDE: 104 meq/L (ref 96–112)
CO2: 30 mEq/L (ref 19–32)
CREATININE: 1.07 mg/dL (ref 0.40–1.20)
Calcium: 9.4 mg/dL (ref 8.4–10.5)
GFR: 55.76 mL/min — AB (ref >60.00)
GLUCOSE: 113 mg/dL — AB (ref 70–99)
POTASSIUM: 4.4 meq/L (ref 3.5–5.1)
Sodium: 139 mEq/L (ref 135–145)

## 2016-09-21 LAB — HEMOGLOBIN A1C: HEMOGLOBIN A1C: 5.8 % (ref 4.6–6.5)

## 2016-09-21 LAB — TSH: TSH: 0.55 u[IU]/mL (ref 0.35–4.50)

## 2016-09-21 LAB — HEPATIC FUNCTION PANEL
ALBUMIN: 4.3 g/dL (ref 3.5–5.2)
ALK PHOS: 116 U/L (ref 39–117)
ALT: 27 U/L (ref 0–35)
AST: 22 U/L (ref 0–37)
BILIRUBIN TOTAL: 0.5 mg/dL (ref 0.2–1.2)
Bilirubin, Direct: 0.1 mg/dL (ref 0.0–0.3)
Total Protein: 6.5 g/dL (ref 6.0–8.3)

## 2016-09-21 LAB — LDL CHOLESTEROL, DIRECT: Direct LDL: 135 mg/dL

## 2016-09-21 MED ORDER — BUDESONIDE-FORMOTEROL FUMARATE 160-4.5 MCG/ACT IN AERO: INHALATION_SPRAY | RESPIRATORY_TRACT | 4 refills | Status: DC

## 2016-09-21 MED ORDER — ALBUTEROL SULFATE HFA 108 (90 BASE) MCG/ACT IN AERS: 2.0000 | INHALATION_SPRAY | Freq: Four times a day (QID) | RESPIRATORY_TRACT | 1 refills | Status: DC | PRN

## 2016-09-21 MED ORDER — ZOLPIDEM TARTRATE 5 MG PO TABS: 5.0000 mg | ORAL_TABLET | Freq: Every evening | ORAL | 4 refills | Status: DC | PRN

## 2016-09-21 MED ORDER — SYNTHROID 175 MCG PO TABS: ORAL_TABLET | ORAL | 4 refills | Status: DC

## 2016-09-21 MED FILL — SYMBICORT 160-4.5 MCG INH: 160-4.5 | 30 days supply | Qty: 10 | Fill #0

## 2016-09-21 MED FILL — SYNTHROID 175 MCG TABLET: 175 | 90 days supply | Qty: 90 | Fill #0

## 2016-09-21 NOTE — Progress Notes (Signed)
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 However she says she only takes it when necessary. I recommend she uses Symbicort daily in the albuterol when necessary  She takes Synthroid 175 g daily for hypothyroidism  She takes Ambien 5 mg at bedtime for sleep. The sleep dysfunction started when she went to menopause. She was on HRT for 3 years by her GYN. Without the Ambien she can't sleep.  Weight is 211 pounds. She seen Dr. Gardenia Phlegm for evaluation of low back pain. He put her on a daily exercise program which she's not doing.  She has very light skin and numerous numerous freckles  She sees her GYN yearly for breast exam pelvic and Paps.  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  She had a colonoscopy by Dr. Collene Mares 10 years ago. She due for follow-up  She does not get routine eye care..... Recommended annual glaucoma screening, she does get regular dental care does BSE monthly and gets annual mammography.  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  BP 136/88 (BP Location: Left Arm)   Temp 97.9 F (36.6 C) (Oral)   Ht 5' 2.25" (1.581 m)   Wt 214 lb (97.1 kg)   BMI 38.83 kg/m  General she is a well-developed well-nourished female no acute distress examination HEENT were negative neck was supple thyroid is not enlarged cardiopulmonary exam normal abdominal exam normal extremities normal skin normal peripheral pulses normal except for multiple freckles all of which appear to be benign  #1 hypothyroidism.......... continue Synthroid check labs  #2 sleep  dysfunction....... continue Ambien.....Marland Kitchen offered other alternatives patient would like to stay with the Ambien  #3 allergic rhinitis........ OTC medications  #4 asthma............ Symbicort 1 puff twice a day............ increased 2 puffs twice a day spring and fall when her allergy symptoms flareup. Albuterol when necessary  #5 overweight recommended Weight Watchers  #6 low back pain chronic...........Marland Kitchen recommended daily exercise follow-up by Dr. Tamala Julian .....Marland KitchenMarland Kitchen

## 2016-09-21 NOTE — Patient Instructions (Addendum)
Labs today........... I will call you if there is anything abnormal  I would recommend Weight Watchers............ I think that's the best program for losing weight  Continue current medications.......... I would recommend a Symbicort 1 puff twice daily on a regular basis and then increase it to 2 puffs twice daily during allergy season which appears to be spring and fall for you  Return in one year for general physical exam sooner if any problems  I recommend Dr. Rachael Fee office for your eye evaluations  Call Dr. Collene Mares for your follow-up colonoscopy  Back exercises daily

## 2016-10-17 ENCOUNTER — Ambulatory Visit (INDEPENDENT_AMBULATORY_CARE_PROVIDER_SITE_OTHER): Payer: 59 | Admitting: Family Medicine

## 2016-10-17 ENCOUNTER — Encounter: Payer: Self-pay | Admitting: Family Medicine

## 2016-10-17 VITALS — BP 138/92 | HR 80 | Temp 97.9°F | Wt 216.0 lb

## 2016-10-17 DIAGNOSIS — R05 Cough: Secondary | ICD-10-CM

## 2016-10-17 DIAGNOSIS — J4520 Mild intermittent asthma, uncomplicated: Secondary | ICD-10-CM

## 2016-10-17 DIAGNOSIS — J0140 Acute pansinusitis, unspecified: Secondary | ICD-10-CM

## 2016-10-17 DIAGNOSIS — R059 Cough, unspecified: Secondary | ICD-10-CM

## 2016-10-17 DIAGNOSIS — J029 Acute pharyngitis, unspecified: Secondary | ICD-10-CM | POA: Diagnosis not present

## 2016-10-17 LAB — POCT RAPID STREP A (OFFICE): Rapid Strep A Screen: NEGATIVE

## 2016-10-17 MED ORDER — BENZONATATE 100 MG PO CAPS: 100.0000 mg | ORAL_CAPSULE | Freq: Three times a day (TID) | ORAL | 0 refills | Status: DC

## 2016-10-17 MED ORDER — DOXYCYCLINE HYCLATE 100 MG PO TABS: 100.0000 mg | ORAL_TABLET | Freq: Two times a day (BID) | ORAL | 0 refills | Status: DC

## 2016-10-17 MED ORDER — HYDROCODONE-HOMATROPINE 5-1.5 MG/5ML PO SYRP: 5.0000 mL | ORAL_SOLUTION | Freq: Three times a day (TID) | ORAL | 0 refills | Status: DC | PRN

## 2016-10-17 MED ORDER — PREDNISONE 10 MG PO TABS: ORAL_TABLET | ORAL | 0 refills | Status: DC

## 2016-10-17 MED FILL — DOXYCYCLINE HYCLATE 100 MG: 100 | 10 days supply | Qty: 20 | Fill #0

## 2016-10-17 MED FILL — BENZONATATE 100 MG CAP: 100 | 7 days supply | Qty: 20 | Fill #0

## 2016-10-17 MED FILL — HYDROCODONE-HOMATROPINE SYR: 5-1.5 | 8 days supply | Qty: 120 | Fill #0

## 2016-10-17 MED FILL — predniSONE 10 MG TABS: 10 | 8 days supply | Qty: 20 | Fill #0

## 2016-10-17 NOTE — Patient Instructions (Signed)
It was a pleasure to see you today. Please take medication with food and drink plenty of water enough for your urine to be pale yellow or clear. Follow-up for evaluation if your symptoms do not improve in 3-4 days, worsen, or you develop a fever greater than 100.   Sinusitis, Adult Sinusitis is soreness and inflammation of your sinuses. Sinuses are hollow spaces in the bones around your face. They are located:  Around your eyes.  In the middle of your forehead.  Behind your nose.  In your cheekbones.  Your sinuses and nasal passages are lined with a stringy fluid (mucus). Mucus normally drains out of your sinuses. When your nasal tissues get inflamed or swollen, the mucus can get trapped or blocked so air cannot flow through your sinuses. This lets bacteria, viruses, and funguses grow, and that leads to infection. Follow these instructions at home: Medicines  Take, use, or apply over-the-counter and prescription medicines only as told by your doctor. These may include nasal sprays.  If you were prescribed an antibiotic medicine, take it as told by your doctor. Do not stop taking the antibiotic even if you start to feel better. Hydrate and Humidify  Drink enough water to keep your pee (urine) clear or pale yellow.  Use a cool mist humidifier to keep the humidity level in your home above 50%.  Breathe in steam for 10-15 minutes, 3-4 times a day or as told by your doctor. You can do this in the bathroom while a hot shower is running.  Try not to spend time in cool or dry air. Rest  Rest as much as possible.  Sleep with your head raised (elevated).  Make sure to get enough sleep each night. General instructions  Put a warm, moist washcloth on your face 3-4 times a day or as told by your doctor. This will help with discomfort.  Wash your hands often with soap and water. If there is no soap and water, use hand sanitizer.  Do not smoke. Avoid being around people who are smoking  (secondhand smoke).  Keep all follow-up visits as told by your doctor. This is important. Contact a doctor if:  You have a fever.  Your symptoms get worse.  Your symptoms do not get better within 10 days. Get help right away if:  You have a very bad headache.  You cannot stop throwing up (vomiting).  You have pain or swelling around your face or eyes.  You have trouble seeing.  You feel confused.  Your neck is stiff.  You have trouble breathing. This information is not intended to replace advice given to you by your health care provider. Make sure you discuss any questions you have with your health care provider. Document Released: 09/28/2007 Document Revised: 12/06/2015 Document Reviewed: 02/04/2015 Elsevier Interactive Patient Education  [employer].

## 2016-10-17 NOTE — Progress Notes (Signed)
Patient ID: Susan Burch, female   DOB: [birth year], 61 y.o.   MRN: [id number]  PCP: Dorena Cookey, MD  Subjective:  Susan Burch is a 61 y.o. year old very pleasant female patient who presents with symptoms including nasal congestion, sore throat that has improved, cough has been intermittently productive, she cannot describe sputum, right ear pressure, post nasal drip, and frontal sinus pain that is worsening. -started: one week ago; symptoms are worsening -previous treatments: Flonase and albuterol have been used once/twice daily which has provided benefit. She has been adherent with Symbicort. -sick contacts/travel/risks: denies flu exposure, recent sick contact exposure at work; she also works as an Therapist, sports at [employer] -Hx of: allergies and asthma No recent antibiotic therapy;   ROS-denies fever, SOB, NVD, tooth pain  Pertinent Past Medical History- Asthma  Medications- reviewed  Current Outpatient Prescriptions  Medication Sig Dispense Refill  . albuterol (PROVENTIL HFA) 108 (90 Base) MCG/ACT inhaler Inhale 2 puffs into the lungs every 6 (six) hours as needed. 1 Inhaler 1  . budesonide-formoterol (SYMBICORT) 160-4.5 MCG/ACT inhaler INHALE 2 PUFFS BY MOUTH 2 TIMES DAILY. 3 Inhaler 4  . Cholecalciferol (VITAMIN D) 2000 units CAPS Take 1 capsule by mouth daily.    . Multiple Vitamin (MULTIVITAMIN) tablet Take 1 tablet by mouth daily.    . ranitidine (ZANTAC) 150 MG tablet Take 150 mg by mouth 2 (two) times daily.    Marland Kitchen SYNTHROID 175 MCG tablet TAKE 1 TABLET BY MOUTH DAILY BEFORE BREAKFAST 90 tablet 4  . Zinc 50 MG TABS Take by mouth.    . zolpidem (AMBIEN) 5 MG tablet Take 1 tablet (5 mg total) by mouth at bedtime as needed. 90 tablet 4   No current facility-administered medications for this visit.     Objective: BP (!) 138/92 (BP Location: Left Arm, Patient Position: Sitting, Cuff Size: Normal)   Pulse 80   Temp 97.9 F (36.6 C) (Oral)   Wt 216 lb (98 kg)   SpO2 97%   BMI  39.19 kg/m  Gen: NAD, resting comfortably HEENT: Turbinates erythematous, TM normal, pharynx mildly erythematous with no tonsilar exudate or edema, + sinus tenderness CV: RRR no murmurs rubs or gallops Lungs: CTAB no crackles, wheezes, or rhonci Abdomen: soft/nontender/nondistended/normal bowel sounds. No rebound or guarding.  Ext: no edema Skin: warm, dry, no rash Neuro: grossly normal, moves all extremities  Assessment/Plan: 1. Acute pansinusitis, recurrence not specified Symptom duration and worsening symptoms with history of asthma; will treat sinusitis and advised follow up if symptoms do not improve with treatment, worsen, or she develops a fever >100. - doxycycline (VIBRA-TABS) 100 MG tablet; Take 1 tablet (100 mg total) by mouth 2 (two) times daily.  Dispense: 20 tablet; Refill: 0  2. Mild intermittent asthma without complication No wheezing noted; patient had just used albuterol prior to appointment; will provide short course of prednisone and she will use if symptoms worsen and advised her to follow up if she is needing albuterol more than once/twice per day after initiation of treatment for sinusitis; Advised continued symbicort; suspect that mild SOB that occurs intermittently has been triggered sinusitis and post nasal drip. Pulse oximeter 97% and RR 15;  - predniSONE (DELTASONE) 10 MG tablet; Take 4 tablets once daily for 2 days, 3 tabs daily for 2 days, 2 tabs daily for 2 days, 1 tab daily for 2 days.  Dispense: 20 tablet; Refill: 0  3. Sore throat Rapid screen negative; patient reports that sore throat  has improved. - POCT rapid strep A  4. Cough Treat symptomatically; she will use benzonatate during the day and cough syrup at night if needed. Advised close follow up if symptoms do not improve with treatment. - benzonatate (TESSALON) 100 MG capsule; Take 1 capsule (100 mg total) by mouth 3 (three) times daily.  Dispense: 20 capsule; Refill: 0 - HYDROcodone-homatropine  (HYCODAN) 5-1.5 MG/5ML syrup; Take 5 mLs by mouth every 8 (eight) hours as needed for cough.  Dispense: 120 mL; Refill: 0  We discussed signs to monitor for and follow up; fever or shortness of breath.    Finally, we reviewed reasons to return to care including if symptoms worsen or persist or new concerns arise- once again particularly shortness of breath or fever.   Laurita Quint, FNP

## 2016-10-20 ENCOUNTER — Ambulatory Visit (INDEPENDENT_AMBULATORY_CARE_PROVIDER_SITE_OTHER)
Admission: RE | Admit: 2016-10-20 | Discharge: 2016-10-20 | Disposition: A | Discharge status: Home / Self Care | Payer: 59 | Source: Ambulatory Visit | Attending: Family Medicine | Admitting: Family Medicine

## 2016-10-20 ENCOUNTER — Ambulatory Visit (INDEPENDENT_AMBULATORY_CARE_PROVIDER_SITE_OTHER): Payer: 59 | Admitting: Family Medicine

## 2016-10-20 ENCOUNTER — Encounter: Payer: Self-pay | Admitting: Family Medicine

## 2016-10-20 VITALS — BP 122/66 | HR 79 | Temp 98.3°F | Ht 62.25 in | Wt 215.4 lb

## 2016-10-20 DIAGNOSIS — R05 Cough: Secondary | ICD-10-CM

## 2016-10-20 DIAGNOSIS — J0140 Acute pansinusitis, unspecified: Secondary | ICD-10-CM | POA: Diagnosis not present

## 2016-10-20 DIAGNOSIS — J4520 Mild intermittent asthma, uncomplicated: Secondary | ICD-10-CM

## 2016-10-20 DIAGNOSIS — I517 Cardiomegaly: Secondary | ICD-10-CM | POA: Diagnosis not present

## 2016-10-20 DIAGNOSIS — R059 Cough, unspecified: Secondary | ICD-10-CM

## 2016-10-20 MED FILL — VENTOLIN HFA 90 MCG INHALER: 108 (90 BAS | 25 days supply | Qty: 18 | Fill #0

## 2016-10-20 NOTE — Progress Notes (Signed)
Subjective:  Susan Burch is a 61 y.o. year old very pleasant female patient who presents for/with See problem oriented charting ROS- no chest pain. Does not feel significantly short of breath- some wheezing but has now improved. No fever or chills.    Past Medical History-  Patient Active Problem List   Diagnosis Date Noted  . Mild intermittent asthma without complication 16/01/9603  . Routine general medical examination at a health care facility 05/26/2015  . Metatarsalgia 05/13/2015  . Muscle strain of right gluteal region 11/19/2014  . Sacroiliac joint dysfunction of right side 08/11/2014  . Nonallopathic lesion of sacral region 08/11/2014  . Nonallopathic lesion of thoracic region 08/11/2014  . Nonallopathic lesion of lumbosacral region 08/11/2014  . Postmenopausal disorder 02/14/2013  . Sleep initiation dysfunction 09/27/2011  . ADVERSE DRUG REACTION, SULFA 08/12/2009  . DERMATITIS, CONTACT, NOS 11/28/2006  . PAIN IN JOINT, LOWER LEG 11/28/2006  . Hypothyroidism 11/06/2006  . Allergic rhinitis 11/06/2006  . GERD 11/06/2006    Medications- reviewed and updated Current Outpatient Prescriptions  Medication Sig Dispense Refill  . albuterol (PROVENTIL HFA) 108 (90 Base) MCG/ACT inhaler Inhale 2 puffs into the lungs every 6 (six) hours as needed. 1 Inhaler 1  . benzonatate (TESSALON) 100 MG capsule Take 1 capsule (100 mg total) by mouth 3 (three) times daily. 20 capsule 0  . budesonide-formoterol (SYMBICORT) 160-4.5 MCG/ACT inhaler INHALE 2 PUFFS BY MOUTH 2 TIMES DAILY. 3 Inhaler 4  . Cholecalciferol (VITAMIN D) 2000 units CAPS Take 1 capsule by mouth daily.    Marland Kitchen doxycycline (VIBRA-TABS) 100 MG tablet Take 1 tablet (100 mg total) by mouth 2 (two) times daily. 20 tablet 0  . HYDROcodone-homatropine (HYCODAN) 5-1.5 MG/5ML syrup Take 5 mLs by mouth every 8 (eight) hours as needed for cough. 120 mL 0  . Multiple Vitamin (MULTIVITAMIN) tablet Take 1 tablet by mouth daily.    .  predniSONE (DELTASONE) 10 MG tablet Take 4 tablets once daily for 2 days, 3 tabs daily for 2 days, 2 tabs daily for 2 days, 1 tab daily for 2 days. 20 tablet 0  . ranitidine (ZANTAC) 150 MG tablet Take 150 mg by mouth 2 (two) times daily.    Marland Kitchen SYNTHROID 175 MCG tablet TAKE 1 TABLET BY MOUTH DAILY BEFORE BREAKFAST 90 tablet 4  . Zinc 50 MG TABS Take by mouth.    . zolpidem (AMBIEN) 5 MG tablet Take 1 tablet (5 mg total) by mouth at bedtime as needed. 90 tablet 4   No current facility-administered medications for this visit.     Objective: BP 122/66 (BP Location: Left Arm, Patient Position: Sitting, Cuff Size: Normal)   Pulse 79   Temp 98.3 F (36.8 C) (Oral)   Ht 5' 2.25" (1.581 m)   Wt 215 lb 6.4 oz (97.7 kg)   SpO2 96%   BMI 39.08 kg/m  Gen: NAD, resting comfortably R TM mild erythema, normal TM on left, pharynx with mild erythema, nares with clear drainage CV: RRR no murmurs rubs or gallops Lungs: CTAB no crackles, wheeze, rhonchi Abdomen: soft/nontender/nondistended/normal bowel sounds. No rebound or guarding.  Ext: no edema Skin: warm, dry  Assessment/Plan:  Cough - Plan: DG Chest 2 View S: Seen on June 25th with nasal congestion, sore throat that was improving (strep was negative), intermittently productive cough, right ear pressure, post nasal drip. Also had worsening frontal sinus pressure. Symptoms had bene present 1 week. Also was feeling short of breath and tight in chest. flonase  and albuterol helped some. 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 History of asthma. She was placed on prednisone and doxycycline as well as hycodan and tessalon.   Used to see Dr. Donneta Romberg and had been on symbicort in the pst and later came off- around physical time about a month ago started on symbicort. Apparently in past had been treated with avelox. Cough is mainly dry or with clear sputum, maybe some yellow  Today, reports right ear better. Cough has been continuing. Sinus pressure  better.  Prior wheeze largely resolved. 12 hours shift Saturday, Sunday, Monday. She denies legs welling or calf pain.  A/P: It appears prior sinusitis is improving. Right ear pressure better (appears to have possible resolving otitis media). Her cough is continuing and that is her main concern but she does not have shortness of breath or fever. Not wheezing or needing regular albuterol so doubt continued asthma flare after prednisone course. She is essentially requesting a stronger antibiotic today but we discussed sinusitis bacterial was reason for antibiotic and that is improving. I suspect she may actually have developed a viral bronchitis which fortunately after prednisone is no longer affecting her breathing/wheezing  From AVS  " Continue current treatments for now. If pneumonia noted- we will start flouroquinolone (avelox if affordable is reasonable)  If negative would expect to slowly resolve over next 1-2 weeks but should see [place] back with new or worsening symptoms particularly shortness of breath or fever or symptoms lasting past another 3 weeks.  "  Addendum- xray was negative for pneumonia. Mild cardiomegaly and suggested follow up with Dr. Sherren Mocha at next appointment- do not think echocardiogram would be worthwhile at present unless she were to have shortness of breath.   Orders Placed This Encounter  Procedures  . DG Chest 2 View    Standing Status:   Future    Number of Occurrences:   1    Standing Expiration Date:   12/20/2017    Order Specific Question:   Reason for Exam (SYMPTOM  OR DIAGNOSIS REQUIRED)    Answer:   cough x 10 days, no fever. thought sinusitis and asthma exacerbation improving on prednisone and doxycycline. Rule out pneumonia- would use fluroquinolone if pneumonia present.    Order Specific Question:   Preferred imaging location?    Answer:   [facility]    Order Specific Question:   Is patient pregnant?    Answer:   No   Return precautions advised.   Garret Reddish, MD

## 2016-10-20 NOTE — Patient Instructions (Addendum)
Please go to [facility] - located [address] across the [address] from [facility] - in the basement - Hours: 8:30-5:30 PM M-F. Do not need appointment.    Continue current treatments for now. If pneumonia noted- we will start flouroquinolone (avelox if affordable is reasonable)  If negative would expect to slowly resolve over next 1-2 weeks but should see [place] back with new or worsening symptoms particularly shortness of breath or fever or symptoms lasting past another 3 weeks.      WE NOW OFFER   [facility] FAST TRACK!!!  SAME DAY Appointments for ACUTE CARE  Such as: Sprains, Injuries, cuts, abrasions, rashes, muscle pain, joint pain, back pain Colds, flu, sore throats, headache, allergies, cough, fever  Ear pain, sinus and eye infections Abdominal pain, nausea, vomiting, diarrhea, upset stomach Animal/insect bites  3 Easy Ways to Schedule: Walk-In Scheduling Call in scheduling Mychart Sign-up: [email or web address]

## 2016-11-01 ENCOUNTER — Ambulatory Visit: Payer: 59 | Admitting: Family Medicine

## 2016-11-01 MED FILL — ZOLPIDEM TARTRATE 5 MG TAB: 5 | 90 days supply | Qty: 90 | Fill #0

## 2016-11-09 ENCOUNTER — Ambulatory Visit: Payer: 59 | Admitting: Family Medicine

## 2016-11-11 MED FILL — SYMBICORT 160-4.5 MCG INH: 160-4.5 | 30 days supply | Qty: 10 | Fill #1

## 2016-11-24 DIAGNOSIS — K219 Gastro-esophageal reflux disease without esophagitis: Secondary | ICD-10-CM | POA: Diagnosis not present

## 2016-11-24 DIAGNOSIS — Z1211 Encounter for screening for malignant neoplasm of colon: Secondary | ICD-10-CM | POA: Diagnosis not present

## 2016-11-24 DIAGNOSIS — R635 Abnormal weight gain: Secondary | ICD-10-CM | POA: Diagnosis not present

## 2016-12-16 MED FILL — SYMBICORT 160-4.5 MCG INH: 160-4.5 | 30 days supply | Qty: 10 | Fill #2

## 2016-12-19 MED FILL — GAVILYTE-G SOLUTION: 236 | 1 days supply | Qty: 4000 | Fill #0

## 2016-12-21 ENCOUNTER — Encounter: Payer: Self-pay | Admitting: Internal Medicine

## 2016-12-21 ENCOUNTER — Ambulatory Visit (INDEPENDENT_AMBULATORY_CARE_PROVIDER_SITE_OTHER): Payer: 59 | Admitting: Internal Medicine

## 2016-12-21 VITALS — BP 140/100 | HR 90 | Temp 98.2°F | Wt 210.0 lb

## 2016-12-21 DIAGNOSIS — J301 Allergic rhinitis due to pollen: Secondary | ICD-10-CM | POA: Diagnosis not present

## 2016-12-21 DIAGNOSIS — J029 Acute pharyngitis, unspecified: Secondary | ICD-10-CM

## 2016-12-21 DIAGNOSIS — R05 Cough: Secondary | ICD-10-CM | POA: Diagnosis not present

## 2016-12-21 DIAGNOSIS — J4520 Mild intermittent asthma, uncomplicated: Secondary | ICD-10-CM | POA: Diagnosis not present

## 2016-12-21 DIAGNOSIS — R059 Cough, unspecified: Secondary | ICD-10-CM

## 2016-12-21 MED ORDER — HYDROCODONE-HOMATROPINE 5-1.5 MG/5ML PO SYRP: 5.0000 mL | ORAL_SOLUTION | Freq: Three times a day (TID) | ORAL | 0 refills | Status: DC | PRN

## 2016-12-21 MED FILL — HYDROCODONE-HOMATROPINE SYR: 5-1.5 | 8 days supply | Qty: 120 | Fill #0

## 2016-12-21 NOTE — Progress Notes (Signed)
Subjective:    Patient ID: Susan Burch, female    DOB: 01-29-1956, 61 y.o.   MRN: [id number]  HPI  61 year old patient who has a history of mild asthma controlled on maintenance medication and also a history of allergic rhinitis. She presents with a five-day history of increasing chest congestion, sore throat and productive cough.  Cough has been productive of some discolored sputum. No active wheezing.  Has not required any albuterol use. She works as an Therapist, sports.  Has a husband and 2 children who have not been ill No fever Rapid strep screen negative for GAS.  No past medical history on file.   Social History   Social History  . Marital status: Married    Spouse name: N/A  . Number of children: N/A  . Years of education: N/A   Occupational History  . Not on file.   Social History Main Topics  . Smoking status: Former Smoker    Packs/day: 0.50    Years: 4.00    Types: Cigarettes    Quit date: 07/12/2004  . Smokeless tobacco: Never Used  . Alcohol use 8.4 oz/week    14 Glasses of wine per week     Comment: 2 glasses per day  . Drug use: Unknown  . Sexual activity: Not on file   Other Topics Concern  . Not on file   Social History Narrative  . No narrative on file    Past Surgical History:  Procedure Laterality Date  . BREAST EXCISIONAL BIOPSY     right    No family history on file.  Allergies  Allergen Reactions  . Bactrim   . Penicillins     Current Outpatient Prescriptions on File Prior to Visit  Medication Sig Dispense Refill  . albuterol (PROVENTIL HFA) 108 (90 Base) MCG/ACT inhaler Inhale 2 puffs into the lungs every 6 (six) hours as needed. 1 Inhaler 1  . benzonatate (TESSALON) 100 MG capsule Take 1 capsule (100 mg total) by mouth 3 (three) times daily. 20 capsule 0  . budesonide-formoterol (SYMBICORT) 160-4.5 MCG/ACT inhaler INHALE 2 PUFFS BY MOUTH 2 TIMES DAILY. 3 Inhaler 4  . Cholecalciferol (VITAMIN D) 2000 units CAPS Take 1 capsule by mouth  daily.    Marland Kitchen doxycycline (VIBRA-TABS) 100 MG tablet Take 1 tablet (100 mg total) by mouth 2 (two) times daily. 20 tablet 0  . Multiple Vitamin (MULTIVITAMIN) tablet Take 1 tablet by mouth daily.    . predniSONE (DELTASONE) 10 MG tablet Take 4 tablets once daily for 2 days, 3 tabs daily for 2 days, 2 tabs daily for 2 days, 1 tab daily for 2 days. 20 tablet 0  . ranitidine (ZANTAC) 150 MG tablet Take 150 mg by mouth 2 (two) times daily.    Marland Kitchen SYNTHROID 175 MCG tablet TAKE 1 TABLET BY MOUTH DAILY BEFORE BREAKFAST 90 tablet 4  . Zinc 50 MG TABS Take by mouth.    . zolpidem (AMBIEN) 5 MG tablet Take 1 tablet (5 mg total) by mouth at bedtime as needed. 90 tablet 4   No current facility-administered medications on file prior to visit.     BP (!) 140/100 (BP Location: Left Arm, Patient Position: Sitting, Cuff Size: Normal)   Pulse 90   Temp 98.2 F (36.8 C) (Oral)   Wt 210 lb (95.3 kg)   SpO2 98%   BMI 38.10 kg/m     Review of Systems  Constitutional: Positive for activity change, appetite change and fatigue. Negative  for chills, diaphoresis and fever.  HENT: Positive for congestion, sinus pressure and sore throat. Negative for dental problem, hearing loss, rhinorrhea and tinnitus.   Eyes: Negative for pain, discharge and visual disturbance.  Respiratory: Positive for cough. Negative for shortness of breath and wheezing.   Cardiovascular: Negative for chest pain, palpitations and leg swelling.  Gastrointestinal: Negative for abdominal distention, abdominal pain, blood in stool, constipation, diarrhea, nausea and vomiting.  Genitourinary: Negative for difficulty urinating, dysuria, flank pain, frequency, hematuria, pelvic pain, urgency, vaginal bleeding, vaginal discharge and vaginal pain.  Musculoskeletal: Negative for arthralgias, gait problem and joint swelling.  Skin: Negative for rash.  Neurological: Negative for dizziness, syncope, speech difficulty, weakness, numbness and headaches.    Hematological: Negative for adenopathy.  Psychiatric/Behavioral: Negative for agitation, behavioral problems and dysphoric mood. The patient is not nervous/anxious.        Objective:   Physical Exam  Constitutional: She is oriented to person, place, and time. She appears well-developed and well-nourished.  HENT:  Head: Normocephalic.  Right Ear: External ear normal.  Left Ear: External ear normal.  Very mild erythema of the oropharynx.  No exudate  Eyes: Pupils are equal, round, and reactive to light. Conjunctivae and EOM are normal.  Neck: Normal range of motion. Neck supple. No thyromegaly present.  Cardiovascular: Normal rate, regular rhythm, normal heart sounds and intact distal pulses.   Pulmonary/Chest: Effort normal and breath sounds normal. She has no wheezes.  Abdominal: Soft. Bowel sounds are normal. She exhibits no mass. There is no tenderness.  Musculoskeletal: Normal range of motion.  Lymphadenopathy:    She has no cervical adenopathy.  Neurological: She is alert and oriented to person, place, and time.  Skin: Skin is warm and dry. No rash noted.  Psychiatric: She has a normal mood and affect. Her behavior is normal.          Assessment & Plan:   Viral bronchitis/pharyngitis.  Will treat symptomatically History of asthma, stable Allergic rhinitis  Continue maintenance medication.  Will treat with rest, fluids and symptomatically with antitussives and Tylenol  [name]

## 2016-12-21 NOTE — Patient Instructions (Addendum)

## 2016-12-22 LAB — POCT RAPID STREP A (OFFICE): RAPID STREP A SCREEN: NEGATIVE

## 2016-12-22 NOTE — Addendum Note (Signed)
Addended by: Wyvonne Lenz on: 12/22/2016 09:35 AM   Modules accepted: Orders

## 2017-01-03 MED FILL — SYNTHROID 175 MCG TABLET: 175 | 90 days supply | Qty: 90 | Fill #1

## 2017-01-09 MED FILL — SYMBICORT 160-4.5 MCG INH: 160-4.5 | 30 days supply | Qty: 10 | Fill #3

## 2017-01-11 ENCOUNTER — Other Ambulatory Visit: Payer: 59

## 2017-01-13 ENCOUNTER — Encounter: Payer: Self-pay | Admitting: Family Medicine

## 2017-01-13 DIAGNOSIS — J019 Acute sinusitis, unspecified: Secondary | ICD-10-CM | POA: Diagnosis not present

## 2017-01-13 DIAGNOSIS — J3089 Other allergic rhinitis: Secondary | ICD-10-CM | POA: Diagnosis not present

## 2017-01-13 DIAGNOSIS — J301 Allergic rhinitis due to pollen: Secondary | ICD-10-CM | POA: Diagnosis not present

## 2017-01-13 DIAGNOSIS — J4540 Moderate persistent asthma, uncomplicated: Secondary | ICD-10-CM | POA: Diagnosis not present

## 2017-01-13 MED FILL — predniSONE 10 MG TABS: 10 | 4 days supply | Qty: 10 | Fill #0

## 2017-01-13 MED FILL — levoFLOXacin 500 MG TABS: 500 | 14 days supply | Qty: 14 | Fill #0

## 2017-01-13 MED FILL — MONTELUKAST SOD 10 MG TAB: 10 | 30 days supply | Qty: 30 | Fill #0

## 2017-01-19 ENCOUNTER — Other Ambulatory Visit (INDEPENDENT_AMBULATORY_CARE_PROVIDER_SITE_OTHER): Payer: 59

## 2017-01-19 DIAGNOSIS — R739 Hyperglycemia, unspecified: Secondary | ICD-10-CM | POA: Diagnosis not present

## 2017-01-19 LAB — HEMOGLOBIN A1C: Hgb A1c MFr Bld: 5.8 % (ref 4.6–6.5)

## 2017-01-20 DIAGNOSIS — H1045 Other chronic allergic conjunctivitis: Secondary | ICD-10-CM | POA: Diagnosis not present

## 2017-01-20 DIAGNOSIS — J3089 Other allergic rhinitis: Secondary | ICD-10-CM | POA: Diagnosis not present

## 2017-01-20 DIAGNOSIS — J301 Allergic rhinitis due to pollen: Secondary | ICD-10-CM | POA: Diagnosis not present

## 2017-01-20 DIAGNOSIS — J4540 Moderate persistent asthma, uncomplicated: Secondary | ICD-10-CM | POA: Diagnosis not present

## 2017-01-20 MED FILL — AZELASTINE HCL 0.05% DROPS: 0.05 | 30 days supply | Qty: 6 | Fill #0

## 2017-01-20 MED FILL — EPINEPHRINE 0.3 MG AUTO-INJ: 0.3 | 30 days supply | Qty: 2 | Fill #0

## 2017-01-26 MED FILL — FLUTICASONE PROP 50 MCG SPR: 50 | 30 days supply | Qty: 16 | Fill #0

## 2017-01-26 MED FILL — AZELASTINE HCL 137 MCG SPRY: 0.1 | 25 days supply | Qty: 30 | Fill #0

## 2017-01-27 DIAGNOSIS — J3089 Other allergic rhinitis: Secondary | ICD-10-CM | POA: Diagnosis not present

## 2017-02-01 DIAGNOSIS — J3089 Other allergic rhinitis: Secondary | ICD-10-CM | POA: Diagnosis not present

## 2017-02-01 MED FILL — ZOLPIDEM TARTRATE 5 MG TAB: 5 | 90 days supply | Qty: 90 | Fill #1

## 2017-02-06 DIAGNOSIS — J3089 Other allergic rhinitis: Secondary | ICD-10-CM | POA: Diagnosis not present

## 2017-02-08 MED FILL — MONTELUKAST SOD 10 MG TAB: 10 | 30 days supply | Qty: 30 | Fill #1

## 2017-02-09 DIAGNOSIS — J3089 Other allergic rhinitis: Secondary | ICD-10-CM | POA: Diagnosis not present

## 2017-02-15 ENCOUNTER — Encounter: Payer: Self-pay | Admitting: Family Medicine

## 2017-02-15 ENCOUNTER — Ambulatory Visit (INDEPENDENT_AMBULATORY_CARE_PROVIDER_SITE_OTHER): Payer: 59 | Admitting: Family Medicine

## 2017-02-15 VITALS — BP 118/98 | HR 77 | Ht 63.0 in | Wt 209.0 lb

## 2017-02-15 DIAGNOSIS — M999 Biomechanical lesion, unspecified: Secondary | ICD-10-CM

## 2017-02-15 DIAGNOSIS — J3089 Other allergic rhinitis: Secondary | ICD-10-CM | POA: Diagnosis not present

## 2017-02-15 DIAGNOSIS — M533 Sacrococcygeal disorders, not elsewhere classified: Secondary | ICD-10-CM

## 2017-02-15 NOTE — Progress Notes (Signed)
[name] [place] [place], [place] [address] Phone: [phone] Subjective:    I'm seeing this patient by the request  of:    CC: Neck and back pain  OHY:[id number]  MAKINZEE DURLEY is a 61 y.o. female coming in for hip and back pain. She said she was doing fine for a while but feels it is time for an adjustment.  Patient has not been seen for 2 months. Not having any significant pain going down at the moment. Feels like she is doing relatively well with the medications. Using the gabapentin fairly regularly. X-rays did show mild to moderate arthritis but patient feels and she is able to do daily activities with very mild soreness.      No past medical history on file. Past Surgical History:  Procedure Laterality Date  . BREAST EXCISIONAL BIOPSY     right   Social History   Social History  . Marital status: Married    Spouse name: N/A  . Number of children: N/A  . Years of education: N/A   Social History Main Topics  . Smoking status: Former Smoker    Packs/day: 0.50    Years: 4.00    Types: Cigarettes    Quit date: 07/12/2004  . Smokeless tobacco: Never Used  . Alcohol use 8.4 oz/week    14 Glasses of wine per week     Comment: 2 glasses per day  . Drug use: Unknown  . Sexual activity: Not Asked   Other Topics Concern  . None   Social History Narrative  . None   Allergies  Allergen Reactions  . Bactrim   . Penicillins    No family history on file.   Past medical history, social, surgical and family history all reviewed in electronic medical record.  No pertanent information unless stated regarding to the chief complaint.   Review of Systems:Review of systems updated and as accurate as of 02/15/17  No headache, visual changes, nausea, vomiting, diarrhea, constipation, dizziness, abdominal pain, skin rash, fevers, chills, night sweats, weight loss, swollen lymph nodes, body aches, joint swelling,  chest pain, shortness of  breath, mood changes. Positive muscle aches  Objective  Blood pressure (!) 118/98, pulse 77, height 5\' 3"  (1.6 m), weight 209 lb (94.8 kg), SpO2 95 %. Systems examined below as of 02/15/17   General: No apparent distress alert and oriented x3 mood and affect normal, dressed appropriately.  HEENT: Pupils equal, extraocular movements intact  Respiratory: Patient's speak in full sentences and does not appear short of breath  Cardiovascular: No lower extremity edema, non tender, no erythema  Skin: Warm dry intact with no signs of infection or rash on extremities or on axial skeleton.  Abdomen: Soft nontender  Neuro: Cranial nerves II through XII are intact, neurovascularly intact in all extremities with 2+ DTRs and 2+ pulses.  Lymph: No lymphadenopathy of posterior or anterior cervical chain or axillae bilaterally.  Gait normal with good balance and coordination.  MSK:  Non tender with full range of motion and good stability and symmetric strength and tone of shoulders, elbows, wrist, hip, knee and ankles bilaterally.  Back Exam:  Inspection: Mild increasing kyphosis of the upper thoracic and mild decrease in lumbar lordosis Motion: Flexion 45 deg, Extension 25 deg, Side Bending to 45 deg bilaterally,  Rotation to 45 deg bilaterally  SLR laying: Negative  XSLR laying: Negative  Palpable tenderness: Tender to palpation in the paraspinal musculature lumbar spine.  FABER: negative. Sensory change: Gross sensation intact to all lumbar and sacral dermatomes.  Reflexes: 2+ at both patellar tendons, 2+ at achilles tendons, Babinski's downgoing.  Strength at foot  Plantar-flexion: 5/5 Dorsi-flexion: 5/5 Eversion: 5/5 Inversion: 5/5  Leg strength  Quad: 5/5 Hamstring: 5/5 Hip flexor: 5/5 Hip abductors: 4/5 but symmetric   Osteopathic findings C2 flexed rotated and side bent right C4 flexed rotated and side bent left C7 flexed rotated and side bent left T3 extended rotated and side bent right  inhaled third rib T7 extended rotated and side bent left L2 flexed rotated and side bent right L4 flexed rotated and side bent right Sacrum right on right      Impression and Recommendations:     This case required medical decision making of moderate complexity.      Note: This dictation was prepared with Dragon dictation along with smaller phrase technology. Any transcriptional errors that result from this process are unintentional.

## 2017-02-15 NOTE — Assessment & Plan Note (Signed)
Decision today to treat with OMT was based on Physical Exam  After verbal consent patient was treated with HVLA, ME, FPR techniques in cervical, thoracic, lumbar and sacral areas  Patient tolerated the procedure well with improvement in symptoms  Patient given exercises, stretches and lifestyle modifications  See medications in patient instructions if given  Patient will follow up in 8-12 weeks

## 2017-02-15 NOTE — Assessment & Plan Note (Signed)
Continues to also be on the right side. Doing relatively well with no radicular symptoms at the moment. I do not think that any significant change in management is needed at this time. Continue with osteopathic manipulation, home exercises and we discussed ergonomics again. Follow-up again in 8-12 weeks

## 2017-02-15 NOTE — Patient Instructions (Signed)
Good to see you  Susan Burch is your friend.  Stay active I am impressed  See me again in 2-3 months.

## 2017-03-17 MED FILL — SYMBICORT 160-4.5 MCG INH: 160-4.5 | 30 days supply | Qty: 10 | Fill #4

## 2017-03-17 MED FILL — MONTELUKAST SOD 10 MG TAB: 10 | 30 days supply | Qty: 30 | Fill #2

## 2017-04-05 MED FILL — SYNTHROID 175 MCG TABLET: 175 | 90 days supply | Qty: 90 | Fill #2

## 2017-04-19 MED FILL — VENTOLIN HFA 90 MCG INHALER: 108 (90 BAS | 25 days supply | Qty: 18 | Fill #1

## 2017-04-19 MED FILL — MONTELUKAST SOD 10 MG TAB: 10 | 30 days supply | Qty: 30 | Fill #3

## 2017-04-19 MED FILL — SYMBICORT 160-4.5 MCG INH: 160-4.5 | 30 days supply | Qty: 10 | Fill #5

## 2017-05-02 ENCOUNTER — Other Ambulatory Visit: Payer: Self-pay | Admitting: Family Medicine

## 2017-05-02 ENCOUNTER — Telehealth: Payer: Self-pay

## 2017-05-02 DIAGNOSIS — Z0000 Encounter for general adult medical examination without abnormal findings: Secondary | ICD-10-CM

## 2017-05-02 DIAGNOSIS — E038 Other specified hypothyroidism: Secondary | ICD-10-CM

## 2017-05-02 DIAGNOSIS — G4700 Insomnia, unspecified: Secondary | ICD-10-CM

## 2017-05-02 MED FILL — ZOLPIDEM TARTRATE 5 MG TAB: 5 | 90 days supply | Qty: 90 | Fill #0

## 2017-05-02 NOTE — Telephone Encounter (Signed)
Pt refill for Ambien 5 mg was phone in to [facility].

## 2017-05-03 ENCOUNTER — Ambulatory Visit: Payer: 59 | Admitting: Family Medicine

## 2017-05-17 MED FILL — SYMBICORT 160-4.5 MCG INH: 160-4.5 | 30 days supply | Qty: 10 | Fill #6

## 2017-05-29 MED FILL — MONTELUKAST SOD 10 MG TAB: 10 | 30 days supply | Qty: 30 | Fill #4

## 2017-05-30 ENCOUNTER — Encounter: Payer: Self-pay | Admitting: Family Medicine

## 2017-05-30 ENCOUNTER — Ambulatory Visit (INDEPENDENT_AMBULATORY_CARE_PROVIDER_SITE_OTHER)
Admission: RE | Admit: 2017-05-30 | Discharge: 2017-05-30 | Disposition: A | Discharge status: Home / Self Care | Payer: 59 | Source: Ambulatory Visit | Attending: Family Medicine | Admitting: Family Medicine

## 2017-05-30 ENCOUNTER — Ambulatory Visit (INDEPENDENT_AMBULATORY_CARE_PROVIDER_SITE_OTHER): Payer: 59 | Admitting: Family Medicine

## 2017-05-30 VITALS — BP 120/88 | HR 72 | Temp 97.7°F | Ht 63.0 in | Wt 214.0 lb

## 2017-05-30 DIAGNOSIS — Z0000 Encounter for general adult medical examination without abnormal findings: Secondary | ICD-10-CM | POA: Diagnosis not present

## 2017-05-30 DIAGNOSIS — G4700 Insomnia, unspecified: Secondary | ICD-10-CM

## 2017-05-30 DIAGNOSIS — J4520 Mild intermittent asthma, uncomplicated: Secondary | ICD-10-CM | POA: Diagnosis not present

## 2017-05-30 DIAGNOSIS — R918 Other nonspecific abnormal finding of lung field: Secondary | ICD-10-CM | POA: Diagnosis not present

## 2017-05-30 DIAGNOSIS — I517 Cardiomegaly: Secondary | ICD-10-CM

## 2017-05-30 DIAGNOSIS — K219 Gastro-esophageal reflux disease without esophagitis: Secondary | ICD-10-CM

## 2017-05-30 DIAGNOSIS — E038 Other specified hypothyroidism: Secondary | ICD-10-CM | POA: Diagnosis not present

## 2017-05-30 DIAGNOSIS — N951 Menopausal and female climacteric states: Secondary | ICD-10-CM

## 2017-05-30 LAB — POCT URINALYSIS DIPSTICK
BILIRUBIN UA: NEGATIVE
Glucose, UA: NEGATIVE
KETONES UA: NEGATIVE
NITRITE UA: NEGATIVE
PH UA: 7 (ref 5.0–8.0)
PROTEIN UA: NEGATIVE
RBC UA: NEGATIVE
SPEC GRAV UA: 1.02 (ref 1.010–1.025)
UROBILINOGEN UA: 0.2 U/dL

## 2017-05-30 LAB — TSH: TSH: 0.03 u[IU]/mL — ABNORMAL LOW (ref 0.35–4.50)

## 2017-05-30 LAB — CBC WITH DIFFERENTIAL/PLATELET
Basophils Absolute: 0 10*3/uL (ref 0.0–0.1)
Basophils Relative: 0.5 % (ref 0.0–3.0)
EOS ABS: 0.1 10*3/uL (ref 0.0–0.7)
Eosinophils Relative: 1.5 % (ref 0.0–5.0)
HEMATOCRIT: 45.5 % (ref 36.0–46.0)
Hemoglobin: 15.6 g/dL — ABNORMAL HIGH (ref 12.0–15.0)
LYMPHS PCT: 24.3 % (ref 12.0–46.0)
Lymphs Abs: 1.5 10*3/uL (ref 0.7–4.0)
MCHC: 34.4 g/dL (ref 30.0–36.0)
MCV: 94.6 fl (ref 78.0–100.0)
MONO ABS: 0.6 10*3/uL (ref 0.1–1.0)
Monocytes Relative: 9.5 % (ref 3.0–12.0)
NEUTROS ABS: 4 10*3/uL (ref 1.4–7.7)
Neutrophils Relative %: 64.2 % (ref 43.0–77.0)
PLATELETS: 273 10*3/uL (ref 150.0–400.0)
RBC: 4.81 Mil/uL (ref 3.87–5.11)
RDW: 14.1 % (ref 11.5–15.5)
WBC: 6.3 10*3/uL (ref 4.0–10.5)

## 2017-05-30 LAB — BASIC METABOLIC PANEL
BUN: 12 mg/dL (ref 6–23)
CALCIUM: 9.3 mg/dL (ref 8.4–10.5)
CO2: 30 mEq/L (ref 19–32)
CREATININE: 0.97 mg/dL (ref 0.40–1.20)
Chloride: 103 mEq/L (ref 96–112)
GFR: 62.3 mL/min (ref >60.00)
GLUCOSE: 107 mg/dL — AB (ref 70–99)
POTASSIUM: 4.2 meq/L (ref 3.5–5.1)
Sodium: 141 mEq/L (ref 135–145)

## 2017-05-30 LAB — LIPID PANEL
CHOLESTEROL: 236 mg/dL — AB (ref 0–200)
HDL: 57.2 mg/dL (ref >39.00)
NonHDL: 178.58
Total CHOL/HDL Ratio: 4
Triglycerides: 265 mg/dL — ABNORMAL HIGH (ref 0.0–149.0)
VLDL: 53 mg/dL — AB (ref 0.0–40.0)

## 2017-05-30 LAB — HEPATIC FUNCTION PANEL
ALT: 39 U/L — ABNORMAL HIGH (ref 0–35)
AST: 30 U/L (ref 0–37)
Albumin: 4.1 g/dL (ref 3.5–5.2)
Alkaline Phosphatase: 95 U/L (ref 39–117)
BILIRUBIN TOTAL: 0.8 mg/dL (ref 0.2–1.2)
Bilirubin, Direct: 0.1 mg/dL (ref 0.0–0.3)
Total Protein: 6.4 g/dL (ref 6.0–8.3)

## 2017-05-30 LAB — T4, FREE: Free T4: 1.18 ng/dL (ref 0.60–1.60)

## 2017-05-30 LAB — T3, FREE: T3 FREE: 3.5 pg/mL (ref 2.3–4.2)

## 2017-05-30 LAB — LDL CHOLESTEROL, DIRECT: Direct LDL: 147 mg/dL

## 2017-05-30 MED ORDER — BUDESONIDE-FORMOTEROL FUMARATE 160-4.5 MCG/ACT IN AERO: INHALATION_SPRAY | RESPIRATORY_TRACT | 4 refills | Status: DC

## 2017-05-30 MED ORDER — SYNTHROID 175 MCG PO TABS: ORAL_TABLET | ORAL | 4 refills | Status: DC

## 2017-05-30 MED ORDER — ESTROGENS, CONJUGATED 0.625 MG/GM VA CREA: 1.0000 | TOPICAL_CREAM | Freq: Every day | VAGINAL | 12 refills | Status: DC

## 2017-05-30 NOTE — Progress Notes (Signed)
Susan Burch is a 62 year old married female nonsmoker....... nurse at [employer]...Marland KitchenMarland KitchenMarland Kitchen who comes in today for general physical examination because of the following issues  She has a history of allergic rhinitis and mild asthma. She takes Symbicort 2 puffs twice a day on a daily basis. With this she rarely uses the albuterol anymore. Due for PFTs  She takes Synthroid 175 g daily because of a history of hypothyroidism. Previous TSH testing is all been normal. She does complain of being hot flushing and sweating a lot. She's read that this can be a sign of 2 months Synthroid. However TSH is of all been normal. We'll get some follow-up labs and get her a consult with Lorie Apley for further evaluation  She was here last year for bronchitis and sinusitis. Chest x-ray was normal except for question of mild cardiomegaly. Dr. Yong Channel reviewed the films a did not think it was significant. She's concerned it would like a follow-up. Recommend we get a follow-up chest x-ray shouldn't sees well cardiac consult if indeed there is any abnormalities.  She is 10 years post menopause asymptomatic except for severe dry vagina. She states that intercourse is painful. She saw her GYN last year for checkup everything was normal. She did not discuss this with her at that time.  She takes Zantac 150 mg twice a day because of a history of reflux  She takes Ambien 5 mg at bedtime. If she doesn't take the Ambien she can't sleep. We've tried numerous other products and nothing seems to help. She's not had any side effects from the medication  She gets routine eye care, dental care, BSE monthly, annual mammography, colonoscopy 2010 was normal  Vaccinations seasonal flu shot given at work all other vaccinations up-to-date. Information given on shingles  Her weight continues to be an issue. She weighs 214 pounds. Last year she was 87. BMI is 37.03. She exercises on a regular basis and does not follow a carbohydrate free diet.  She said it is difficult to lose weight.  Family history unchanged  Social history,,,,,,,,, married lives here in [place] works at [employer]. A son and daughter both in college living at home.  14 point review of systems otherwise negative  EKG was done because a history of question cardiomegaly  BP 120/88 (BP Location: Left Arm, Patient Position: Sitting, Cuff Size: Normal)   Pulse 72   Temp 97.7 F (36.5 C) (Oral)   Ht 5\' 3"  (1.6 m)   Wt 214 lb (97.1 kg)   BMI 37.91 kg/m  Well-developed well-nourished overweight female no acute distress examination HEENT were negative neck was supple thyroid is not enlarged cardiopulmonary exam normal specifically cardiac exam PMI in the fifth intercostal space midclavicular line no heaves or thrills S1-S2 within normal limits S2 is physiologically split I can appreciate no murmurs rubs nor gallops. Abdominal exam normal except for marked obesity. Extremities normal skin normal peripheral pulses normal  #1 obesity.......... again encouraged diet exercise and weight loss  #2 asthma......... continue Symbicort............. set up PFTs  #3 cardiomegaly on chest x-ray........ follow-up chest x-ray to document abnormality.....Marland Kitchen if abnormality still present recommend cardiac consult  #4 hypothyroidism.....Marland Kitchen check labs......... consult with Dr. Lorie Apley because the above symptoms of sweating etc.  #5 postmenopausal vaginal dryness.......... hormonal cream as outlined  #6 reflux esophagitis.......... continue Zantac 150 mg twice a day  #7 sleep dysfunction......... continue Ambien 5 mg daily at bedtime.Marland KitchenMarland KitchenMarland Kitchen

## 2017-05-30 NOTE — Patient Instructions (Signed)
Labs today.......... I will call you if this anything abnormal  Go to the main office for your chest x-ray now  We will set you up in the future for pulmonary function studies because of your asthma  We will also set you up a consult with Dr. Lorie Apley for evaluation of your symptoms  Work hard in the next 12 months to lose 12 pounds.......... walk 30 minutes daily...Marland KitchenMarland KitchenMarland Kitchen carbohydrate free diet  Premarin cream............ one applicator 3 times weekly for 3 months......... then one applicator twice weekly going forward.

## 2017-06-01 ENCOUNTER — Telehealth: Payer: Self-pay | Admitting: Family Medicine

## 2017-06-01 MED FILL — PREMARIN VAGINAL CREAM-APPL: 0.625 | 30 days supply | Qty: 30 | Fill #0

## 2017-06-01 NOTE — Telephone Encounter (Signed)
Premarin question. " Wants more specific orders for how many grams the pt is supposed to use with the Premarin" She can be reached at [phone].

## 2017-06-01 NOTE — Telephone Encounter (Signed)
Spoke with [facility] gave clarification on pt premarin cream.

## 2017-06-01 NOTE — Telephone Encounter (Signed)
Copied from [facility] [id number]. Topic: Quick Communication - See Telephone Encounter >> Jun 01, 2017 11:13 AM Corie Chiquito, NT wrote: CRM for notification. Manuela Schwartz called because she needs more pacific orders for how many grams the patient is supposed to use with the Premarin. If someone could give them a call back about this at [phone]  06/01/17.

## 2017-06-03 ENCOUNTER — Ambulatory Visit (INDEPENDENT_AMBULATORY_CARE_PROVIDER_SITE_OTHER): Payer: Self-pay | Admitting: Emergency Medicine

## 2017-06-03 VITALS — BP 122/78 | HR 95 | Temp 98.7°F | Resp 18

## 2017-06-03 DIAGNOSIS — R05 Cough: Secondary | ICD-10-CM

## 2017-06-03 DIAGNOSIS — J101 Influenza due to other identified influenza virus with other respiratory manifestations: Secondary | ICD-10-CM

## 2017-06-03 DIAGNOSIS — R059 Cough, unspecified: Secondary | ICD-10-CM

## 2017-06-03 LAB — POCT INFLUENZA A/B
INFLUENZA A, POC: POSITIVE — AB
INFLUENZA B, POC: NEGATIVE

## 2017-06-03 MED ORDER — GUAIFENESIN-CODEINE 100-10 MG/5ML PO SOLN: 5.0000 mL | Freq: Four times a day (QID) | ORAL | 0 refills | Status: DC | PRN

## 2017-06-03 MED ORDER — IPRATROPIUM BROMIDE 0.06 % NA SOLN: 2.0000 | Freq: Four times a day (QID) | NASAL | 0 refills | Status: DC

## 2017-06-03 MED ORDER — OSELTAMIVIR PHOSPHATE 75 MG PO CAPS: 75.0000 mg | ORAL_CAPSULE | Freq: Two times a day (BID) | ORAL | 0 refills | Status: DC

## 2017-06-03 NOTE — Progress Notes (Signed)
Subjective:     Susan Burch is a 62 y.o. female who presents for evaluation of influenza like symptoms. Symptoms include low grade fevers, chills, headache, myalgias, productive cough and fever and have been present for 3 days. She has tried to alleviate the symptoms with acetaminophen with moderate relief. High risk factors for influenza complications: co-morbid illness.  The following portions of the patient's history were reviewed and updated as appropriate: allergies and current medications.  Review of Systems Pertinent items noted in HPI and remainder of comprehensive ROS otherwise negative.     Objective:   Vitals:   06/03/17 1143  BP: 122/78  Pulse: 95  Resp: 18  Temp: 98.7 F (37.1 C)  SpO2: 97%   Physical Exam  Constitutional: She appears well-developed and well-nourished.  HENT:  Head: Normocephalic and atraumatic.  Right Ear: External ear normal.  Left Ear: External ear normal.  Nose: Nose normal.  Mouth/Throat: Uvula is midline and oropharynx is clear and moist.  Eyes: Conjunctivae are normal.  Neck: Normal range of motion. Neck supple.  Cardiovascular: Normal rate and regular rhythm.  Pulmonary/Chest: Effort normal and breath sounds normal.  Lymphadenopathy:    She has no cervical adenopathy.  Neurological: She is alert.  Skin: Skin is warm. Capillary refill takes less than 2 seconds.  Psychiatric: She has a normal mood and affect.  Nursing note and vitals reviewed.   Results for orders placed or performed in visit on 06/03/17  POCT Influenza A/B  Result Value Ref Range   Influenza A, POC Positive (A) Negative   Influenza B, POC Negative Negative    Assessment:   1. Cough   2. Influenza A      Plan:   1. Cough -Cough supersent  - POCT Influenza A/B  2. Influenza A -Tylenol or motrin, work note provided, follow up as needed. - guaiFENesin-codeine 100-10 MG/5ML syrup; Take 5 mLs by mouth every 6 (six) hours as needed for cough.  Dispense: 120  mL; Refill: 0 - ipratropium (ATROVENT) 0.06 % nasal spray; Place 2 sprays into both nostrils 4 (four) times daily.  Dispense: 15 mL; Refill: 0 - oseltamivir (TAMIFLU) 75 MG capsule; Take 1 capsule (75 mg total) by mouth 2 (two) times daily.  Dispense: 10 capsule; Refill: 0

## 2017-06-03 NOTE — Patient Instructions (Signed)

## 2017-06-14 ENCOUNTER — Other Ambulatory Visit: Payer: Self-pay | Admitting: Family Medicine

## 2017-06-14 DIAGNOSIS — J4520 Mild intermittent asthma, uncomplicated: Secondary | ICD-10-CM

## 2017-06-15 ENCOUNTER — Ambulatory Visit (INDEPENDENT_AMBULATORY_CARE_PROVIDER_SITE_OTHER): Payer: 59 | Admitting: Internal Medicine

## 2017-06-15 DIAGNOSIS — J4520 Mild intermittent asthma, uncomplicated: Secondary | ICD-10-CM

## 2017-06-15 LAB — PULMONARY FUNCTION TEST
DL/VA % pred: 87 %
DL/VA: 4.12 ml/min/mmHg/L
DLCO COR: 20.15 ml/min/mmHg
DLCO UNC % PRED: 93 %
DLCO UNC: 21.4 ml/min/mmHg
DLCO cor % pred: 87 %
FEF 25-75 PRE: 2.93 L/s
FEF 25-75 Post: 2.54 L/sec
FEF2575-%CHANGE-POST: -13 %
FEF2575-%PRED-PRE: 127 %
FEF2575-%Pred-Post: 110 %
FEV1-%Change-Post: -4 %
FEV1-%PRED-POST: 106 %
FEV1-%PRED-PRE: 111 %
FEV1-POST: 2.63 L
FEV1-Pre: 2.76 L
FEV1FVC-%CHANGE-POST: -2 %
FEV1FVC-%Pred-Pre: 107 %
FEV6-%CHANGE-POST: -2 %
FEV6-%Pred-Post: 103 %
FEV6-%Pred-Pre: 106 %
FEV6-Post: 3.2 L
FEV6-Pre: 3.28 L
FEV6FVC-%CHANGE-POST: 0 %
FEV6FVC-%PRED-POST: 102 %
FEV6FVC-%Pred-Pre: 103 %
FVC-%Change-Post: -2 %
FVC-%Pred-Post: 100 %
FVC-%Pred-Pre: 102 %
FVC-Post: 3.21 L
FVC-Pre: 3.28 L
PRE FEV1/FVC RATIO: 84 %
Post FEV1/FVC ratio: 82 %
Post FEV6/FVC ratio: 99 %
Pre FEV6/FVC Ratio: 100 %
RV % pred: 102 %
RV: 1.98 L
TLC % PRED: 112 %
TLC: 5.5 L

## 2017-06-15 NOTE — Progress Notes (Signed)
PFT completed today 06/15/17 per Dr. Dellis Filbert.

## 2017-06-16 MED FILL — SYMBICORT 160-4.5 MCG INH: 160-4.5 | 30 days supply | Qty: 10 | Fill #7

## 2017-06-20 ENCOUNTER — Telehealth: Payer: Self-pay | Admitting: Family Medicine

## 2017-06-20 NOTE — Telephone Encounter (Signed)
Per chart pt is to be contacted by [facility] about scheduling with Dr. Cruzita Lederer. Dr. Sherren Mocha will generally fill medications until appt with Endo.   LMTCB to advise.

## 2017-06-20 NOTE — Telephone Encounter (Signed)
Pt called back and said she has apt with endo on April 23

## 2017-06-20 NOTE — Telephone Encounter (Signed)
Copied from [facility] [phone]. Topic: Referral - Question >> Jun 20, 2017 10:14 AM Robina Ade, Helene Kelp D wrote: Reason for CRM: Patient called to know the status of her referral to see someone at [facility]. She said that the person Dr. Sherren Mocha recommended she can not see and she was given a different physician but won't be able to see him until a month. She also wanted to know since she can not get in sooner if her SYNTHROID 175 MCG tablet can be adjusted. Please call patient back, thanks.

## 2017-06-29 MED FILL — PREMARIN VAGINAL CREAM-APPL: 0.625 | 30 days supply | Qty: 30 | Fill #1

## 2017-06-29 MED FILL — MONTELUKAST SOD 10 MG TAB: 10 | 30 days supply | Qty: 30 | Fill #5

## 2017-07-06 MED FILL — AZELASTINE HCL 137 MCG SPRY: 0.1 | 25 days supply | Qty: 30 | Fill #1

## 2017-07-06 MED FILL — SYNTHROID 175 MCG TABLET: 175 | 90 days supply | Qty: 90 | Fill #3

## 2017-07-21 MED FILL — SYMBICORT 160-4.5 MCG INH: 160-4.5 | 30 days supply | Qty: 10 | Fill #8

## 2017-08-01 MED FILL — ZOLPIDEM TARTRATE 5 MG TAB: 5 | 90 days supply | Qty: 90 | Fill #1

## 2017-08-04 ENCOUNTER — Other Ambulatory Visit: Payer: Self-pay | Admitting: Family Medicine

## 2017-08-04 DIAGNOSIS — Z139 Encounter for screening, unspecified: Secondary | ICD-10-CM

## 2017-08-15 ENCOUNTER — Ambulatory Visit: Payer: 59 | Admitting: Internal Medicine

## 2017-08-15 ENCOUNTER — Encounter: Payer: Self-pay | Admitting: Internal Medicine

## 2017-08-15 VITALS — BP 124/70 | HR 75 | Ht 63.0 in | Wt 214.6 lb

## 2017-08-15 DIAGNOSIS — E039 Hypothyroidism, unspecified: Secondary | ICD-10-CM

## 2017-08-15 LAB — T4, FREE: FREE T4: 1.36 ng/dL (ref 0.60–1.60)

## 2017-08-15 LAB — TSH: TSH: 0.04 u[IU]/mL — AB (ref 0.35–4.50)

## 2017-08-15 MED ORDER — SYNTHROID 137 MCG PO TABS: 137.0000 ug | ORAL_TABLET | Freq: Every day | ORAL | 3 refills | Status: DC

## 2017-08-15 NOTE — Progress Notes (Signed)
Patient ID: Susan Burch, female   DOB: 08-12-1955, 62 y.o.   MRN: [id number]    HPI  Susan Burch is a 62 y.o.-year-old female, referred by her PCP, Dr.Todd, for management of uncontrolled hypothyroidism.  Pt. has been dx with hypothyroidism in 1997-1998 after birth of her son >> on Synthroid d.a.w. 175 mcg.  She takes the thyroid hormone: - fasting - with water, diet sodas, coffee + creamer (!) - separated by1h from b'fast  - no calcium, iron, PPIs - + multivitamins (Hair Skin and Nails) along with Synthroid (!) - last dose 5 days ago. - + Zantac later in the day  I reviewed pt's thyroid tests: Lab Results  Component Value Date   TSH 0.03 (L) 05/30/2017   TSH 0.55 09/21/2016   TSH 4.21 08/06/2015   TSH 7.65 (H) 07/01/2015   TSH 10.78 (H) 05/26/2015   TSH 1.35 03/18/2014   TSH 2.84 02/08/2013   TSH 0.64 09/21/2011   FREET4 1.18 05/30/2017   T3FREE 3.5 05/30/2017   Antithyroid antibodies: No results found for: THGAB No components found for: TPOAB  Pt describes: - + heat intolerance, + sweating  She denies: - weight loss. She recently joined [employer]. - tremors - palpitations - anxiety - hyperdefecation - hair loss  Pt denies feeling nodules in neck, hoarseness, + occas. Dysphagia/no odynophagia, no SOB with lying down.  She has + FH of thyroid disorders in: M and M aunt and uncle. Also all her children - hypothyroidism, M aunt. No FH of thyroid cancer.  No h/o radiation tx to head or neck. No recent use of iodine supplements. No recent CT scans with contrast.   Pt. also has a history of asthma.Prn steroid inj - last was few months ago.  ROS: Constitutional: no weight gain/loss, no fatigue, + heat intolerance, + increased sweating Eyes: no blurry vision, no xerophthalmia ENT: no sore throat, + see HPI Cardiovascular: no CP/SOB/palpitations/leg swelling Respiratory: + cough/no SOB Gastrointestinal: no N/V/D/C/ + acid reflux Musculoskeletal: + Both  muscle/joint aches Skin: no rashes, + hair loss Neurological: no tremors/numbness/tingling/dizziness, + HA Psychiatric: no depression/anxiety  Past Medical History:  Diagnosis Date  . Acid reflux   . High cholesterol   . Thyroid dysfunction    Past Surgical History:  Procedure Laterality Date  . BREAST EXCISIONAL BIOPSY  1990   right  . [place]  . CHOLECYSTECTOMY  1998  . MYOMECTOMY  1993   Social History   Socioeconomic History  . Marital status: Married    Spouse name: Not on file  . Number of children: 2  .    .    Occupational History  . RN  . Smoking status: Former Smoker    Packs/day: 0.50    Years: 4.00    Pack years: 2.00    Types: Cigarettes    Last attempt to quit: 07/12/2004    Years since quitting: 13.1  . Smokeless tobacco: Never Used  Substance and Sexual Activity  . Alcohol use: Yes    Alcohol/week: 8.4 oz    Types: 14 Glasses of red wine per week    Comment: 2 glasses per day  . Drug use: No   Current Outpatient Medications on File Prior to Visit  Medication Sig Dispense Refill  . albuterol (PROVENTIL HFA) 108 (90 Base) MCG/ACT inhaler Inhale 2 puffs into the lungs every 6 (six) hours as needed. 1 Inhaler 1  . budesonide-formoterol (SYMBICORT) 160-4.5 MCG/ACT inhaler INHALE  2 PUFFS BY MOUTH 2 TIMES DAILY. 3 Inhaler 4  . Multiple Vitamin (MULTIVITAMIN) tablet Take 1 tablet by mouth daily.    . ranitidine (ZANTAC) 150 MG tablet Take 150 mg by mouth 2 (two) times daily.    Marland Kitchen SYNTHROID 175 MCG tablet TAKE 1 TABLET BY MOUTH DAILY BEFORE BREAKFAST 90 tablet 4  . zolpidem (AMBIEN) 5 MG tablet TAKE 1 TABLET BY MOUTH AT BEDTIME AS NEEDED. 90 tablet 4   No current facility-administered medications on file prior to visit.    Allergies  Allergen Reactions  . Bactrim   . Penicillins    FH:  Diabetes in brother, MGM  HTN in father, maternal uncle  HL in father  Heart disease in mother, maternal aunt, maternal uncle  Thyroid  problems-see HPI  Cancer in maternal aunt and first cousin: Multiple myeloma, lymphoma, respectively  PE: BP 124/70   Pulse 75   Ht _0  (1.6 m)   Wt 214 lb 9.6 oz (97.3 kg)   SpO2 98%   BMI 38.01 kg/m  Wt Readings from Last 3 Encounters:  08/15/17 214 lb 9.6 oz (97.3 kg)  05/30/17 214 lb (97.1 kg)  02/15/17 209 lb (94.8 kg)   Constitutional: overweight, in NAD Eyes: PERRLA, EOMI, no exophthalmos ENT: moist mucous membranes, no thyromegaly, no cervical lymphadenopathy Cardiovascular: RRR, No MRG Respiratory: CTA B Gastrointestinal: abdomen soft, NT, ND, BS+ Musculoskeletal: no deformities, strength intact in all 4 Skin: moist, warm, no rashes Neurological: no tremor with outstretched hands, DTR normal in all 4  ASSESSMENT: 1. Hypothyroidism  PLAN:  1. Patient with long-standing hypothyroidism, on levothyroxine therapy.  TSH levels have been fluctuating in the past, most likely due to incorrect dosing: she is taking the levothyroxine occasionally along with multivitamins and occasionally with coffee and creamer.  We discussed about separating levothyroxine from all of these. - We discussed about correct intake of levothyroxine, fasting, with water, separated by at least 30 minutes from breakfast, and separated by more than 4 hours from calcium, iron, multivitamins, acid reflux medications (PPIs). - she appears euthyroid, however, she complains of hot flashes and heat intolerance.  This is conceivable with her suppressed TSH.  We discussed about normalizing the TSH before reevaluating her for the symptoms. - she does not appear to have a goiter, thyroid nodules, or neck compression symptoms, except occasional dysphagia.  Her thyroid is not enlarged on palpation. - will check thyroid tests today: TSH, free T4; I am sure we need to decrease the dose of Synthroid to at least 150 mcg daily after the results return - If labs today are abnormal, she will need to return in ~6 weeks for  repeat labs - Otherwise, I will see her back in 6 months  Component     Latest Ref Rng & Units 08/15/2017  TSH     0.35 - 4.50 uIU/mL 0.04 (L)  T4,Free(Direct)     0.60 - 1.60 ng/dL 1.36   TSH is still suppressed, so we will decrease her Synthroid, especially since she is also starting to separate Synthroid from the rest of her medicines and supplements, and also coffee with creamer.  I will advise her to decrease the dose to 137 mcg daily and come back for labs in 1.5 months.  Philemon Kingdom, MD PhD [facility] End[facility]

## 2017-08-15 NOTE — Patient Instructions (Signed)
Please continue Synthroid 175 mcg daily.  Take the thyroid hormone every day, with water, at least 30 minutes before breakfast, separated by at least 4 hours from: - acid reflux medications - calcium - iron - multivitamins  Please stop at the lab.  Please come back for a follow-up appointment in 6 months.

## 2017-08-16 MED FILL — SYNTHROID 137 MCG TABLET: 137 | 30 days supply | Qty: 30 | Fill #0

## 2017-08-16 MED FILL — SYMBICORT 160-4.5 MCG INH: 160-4.5 | 30 days supply | Qty: 10 | Fill #9

## 2017-09-06 ENCOUNTER — Telehealth: Payer: Self-pay | Admitting: Internal Medicine

## 2017-09-07 NOTE — Telephone Encounter (Signed)
error 

## 2017-09-12 MED FILL — SYNTHROID 137 MCG TABLET: 137 | 30 days supply | Qty: 30 | Fill #1

## 2017-09-14 ENCOUNTER — Ambulatory Visit: Payer: 59

## 2017-09-26 MED FILL — SYMBICORT 160-4.5 MCG INH: 160-4.5 | 30 days supply | Qty: 10 | Fill #0

## 2017-09-28 ENCOUNTER — Other Ambulatory Visit (INDEPENDENT_AMBULATORY_CARE_PROVIDER_SITE_OTHER): Payer: 59

## 2017-09-28 DIAGNOSIS — E039 Hypothyroidism, unspecified: Secondary | ICD-10-CM | POA: Diagnosis not present

## 2017-09-28 LAB — T4, FREE: Free T4: 1.24 ng/dL (ref 0.60–1.60)

## 2017-09-28 LAB — TSH: TSH: 0.19 u[IU]/mL — AB (ref 0.35–4.50)

## 2017-09-29 ENCOUNTER — Other Ambulatory Visit: Payer: Self-pay | Admitting: Internal Medicine

## 2017-09-29 DIAGNOSIS — E039 Hypothyroidism, unspecified: Secondary | ICD-10-CM

## 2017-09-29 MED ORDER — SYNTHROID 125 MCG PO TABS: 125.0000 ug | ORAL_TABLET | Freq: Every day | ORAL | 5 refills | Status: DC

## 2017-09-29 MED FILL — SYNTHROID 125 MCG TABLET: 125 | 30 days supply | Qty: 30 | Fill #0

## 2017-10-05 ENCOUNTER — Ambulatory Visit
Admission: RE | Admit: 2017-10-05 | Discharge: 2017-10-05 | Disposition: A | Discharge status: Home / Self Care | Payer: 59 | Source: Ambulatory Visit | Attending: Family Medicine | Admitting: Family Medicine

## 2017-10-05 DIAGNOSIS — Z139 Encounter for screening, unspecified: Secondary | ICD-10-CM

## 2017-10-05 DIAGNOSIS — Z1231 Encounter for screening mammogram for malignant neoplasm of breast: Secondary | ICD-10-CM | POA: Diagnosis not present

## 2017-10-23 MED FILL — SYNTHROID 125 MCG TABLET: 125 | 30 days supply | Qty: 30 | Fill #1

## 2017-10-30 MED FILL — SYMBICORT 160-4.5 MCG INH: 160-4.5 | 30 days supply | Qty: 10 | Fill #1

## 2017-10-30 MED FILL — ZOLPIDEM TARTRATE 5 MG TAB: 5 | 90 days supply | Qty: 90 | Fill #2

## 2017-11-15 ENCOUNTER — Encounter: Payer: Self-pay | Admitting: Internal Medicine

## 2017-11-15 ENCOUNTER — Other Ambulatory Visit (INDEPENDENT_AMBULATORY_CARE_PROVIDER_SITE_OTHER): Payer: 59

## 2017-11-15 DIAGNOSIS — E039 Hypothyroidism, unspecified: Secondary | ICD-10-CM | POA: Diagnosis not present

## 2017-11-15 LAB — T4, FREE: Free T4: 1.07 ng/dL (ref 0.60–1.60)

## 2017-11-15 LAB — TSH: TSH: 1.08 u[IU]/mL (ref 0.35–4.50)

## 2017-11-22 MED FILL — SYNTHROID 125 MCG TABLET: 125 | 30 days supply | Qty: 30 | Fill #2

## 2017-12-12 MED FILL — SYMBICORT 160-4.5 MCG INH: 160-4.5 | 30 days supply | Qty: 10 | Fill #2

## 2017-12-22 MED FILL — AZELASTINE HCL 137 MCG/SPRA: 137 | 25 days supply | Qty: 30 | Fill #2

## 2017-12-26 MED FILL — SYNTHROID 125 MCG TABLET: 125 | 30 days supply | Qty: 30 | Fill #3

## 2018-01-16 MED FILL — SYMBICORT 160-4.5 MCG INH: 160-4.5 | 30 days supply | Qty: 10 | Fill #3

## 2018-01-25 MED FILL — SYNTHROID 125 MCG TABLET: 125 | 30 days supply | Qty: 30 | Fill #4

## 2018-01-30 ENCOUNTER — Other Ambulatory Visit: Payer: Self-pay | Admitting: Family Medicine

## 2018-01-30 ENCOUNTER — Telehealth: Payer: Self-pay | Admitting: Family Medicine

## 2018-01-30 DIAGNOSIS — Z0000 Encounter for general adult medical examination without abnormal findings: Secondary | ICD-10-CM

## 2018-01-30 DIAGNOSIS — E038 Other specified hypothyroidism: Secondary | ICD-10-CM

## 2018-01-30 DIAGNOSIS — G4700 Insomnia, unspecified: Secondary | ICD-10-CM

## 2018-01-30 MED ORDER — ZOLPIDEM TARTRATE 5 MG PO TABS: 5.0000 mg | ORAL_TABLET | Freq: Every evening | ORAL | 1 refills | Status: DC | PRN

## 2018-01-31 ENCOUNTER — Encounter: Payer: Self-pay | Admitting: Family Medicine

## 2018-01-31 MED FILL — ZOLPIDEM TARTRATE 5 MG TAB: 5 | 90 days supply | Qty: 90 | Fill #0

## 2018-01-31 NOTE — Telephone Encounter (Signed)
Pt notified that Rx has been re faxed. I informed pt if pharmacy has not received Rx she is more than welcome to pick it up. Pt stated she will call the pharmacy tomorrow am and pick it up if they have not received it.

## 2018-01-31 NOTE — Telephone Encounter (Signed)
Patient says the script was not received by [facility]. Please resend or call her and let her know if it was printed in office.

## 2018-02-15 ENCOUNTER — Encounter: Payer: Self-pay | Admitting: Internal Medicine

## 2018-02-15 ENCOUNTER — Ambulatory Visit: Payer: 59 | Admitting: Internal Medicine

## 2018-02-15 VITALS — BP 118/80 | HR 74 | Ht 63.0 in | Wt 211.0 lb

## 2018-02-15 DIAGNOSIS — E039 Hypothyroidism, unspecified: Secondary | ICD-10-CM

## 2018-02-15 LAB — T4, FREE: Free T4: 1.12 ng/dL (ref 0.60–1.60)

## 2018-02-15 LAB — TSH: TSH: 0.77 u[IU]/mL (ref 0.35–4.50)

## 2018-02-15 NOTE — Progress Notes (Signed)
Patient ID: Susan Burch, female   DOB: Jan 27, 1956, 62 y.o.   MRN: [id number]    HPI  Susan Burch is a 62 y.o.-year-old female, returning for follow-up for uncontrolled hypothyroidism.  Last visit 4 months ago.  Pt. has been dx with hypothyroidism in 19 97-98 after the birth of her son.  She was started on Synthroid then.  When I last saw her, she was on 175 mcg daily, however, she was not taking the medication correctly.  We were able to decrease the dose after she started to take it correctly so she is currently on 125 mcg daily.  She takes her Synthroid: - in am - fasting - at least 30 min from b'fast and coffee + creamer - no Ca, Fe, PPIs  - stopped Hair skin and nails  - stopped Zantac  - not on Biotin  I reviewed pt's thyroid tests: Lab Results  Component Value Date   TSH 1.08 11/15/2017   TSH 0.19 (L) 09/28/2017   TSH 0.04 (L) 08/15/2017   TSH 0.03 (L) 05/30/2017   TSH 0.55 09/21/2016   TSH 4.21 08/06/2015   TSH 7.65 (H) 07/01/2015   TSH 10.78 (H) 05/26/2015   TSH 1.35 03/18/2014   TSH 2.84 02/08/2013   FREET4 1.07 11/15/2017   FREET4 1.24 09/28/2017   FREET4 1.36 08/15/2017   FREET4 1.18 05/30/2017   T3FREE 3.5 05/30/2017   She again describes heat intolerance and sweating >> improved.  Pt denies: - feeling nodules in neck - hoarseness - choking - SOB with lying down She continues to c/o dysphagia - acid reflux.  She has + FH of thyroid disorders in: M and M aunt and uncle. Also all her children - hypothyroidism, M aunt. No FH of thyroid cancer. No h/o radiation tx to head or neck.  No recent contrast studies. No herbal supplements. No Biotin use. No recent steroids use.   Pt. also has a history of asthma with cough.  She gets as needed steroids,  But did not have these recently.  She is on Tagamet.  ROS: Constitutional: no weight gain/no weight loss, no fatigue, no subjective hyperthermia, no subjective hypothermia Eyes: no blurry vision, no  xerophthalmia ENT: no sore throat, + see HPI Cardiovascular: no CP/no SOB/no palpitations/no leg swelling Respiratory: + cough/no SOB/no wheezing Gastrointestinal: no N/no V/no D/no C/+ acid reflux Musculoskeletal: no muscle aches/no joint aches Skin: no rashes, + hair loss Neurological: no tremors/no numbness/no tingling/no dizziness  I reviewed pt's medications, allergies, PMH, social hx, family hx, and changes were documented in the history of present illness. Otherwise, unchanged from my initial visit note.  Past Medical History:  Diagnosis Date  . Acid reflux   . High cholesterol   . Thyroid dysfunction    Past Surgical History:  Procedure Laterality Date  . BREAST EXCISIONAL BIOPSY  1990   right  . [place]  . CHOLECYSTECTOMY  1998  . MYOMECTOMY  1993   Social History   Socioeconomic History  . Marital status: Married    Spouse name: Not on file  . Number of children: 2  .    .    Occupational History  . RN  . Smoking status: Former Smoker    Packs/day: 0.50    Years: 4.00    Pack years: 2.00    Types: Cigarettes    Last attempt to quit: 07/12/2004    Years since quitting: 13.1  . Smokeless tobacco: Never Used  Substance and Sexual Activity  . Alcohol use: Yes    Alcohol/week: 8.4 oz    Types: 14 Glasses of red wine per week    Comment: 2 glasses per day  . Drug use: No   Current Outpatient Medications on File Prior to Visit  Medication Sig Dispense Refill  . albuterol (PROVENTIL HFA) 108 (90 Base) MCG/ACT inhaler Inhale 2 puffs into the lungs every 6 (six) hours as needed. 1 Inhaler 1  . budesonide-formoterol (SYMBICORT) 160-4.5 MCG/ACT inhaler INHALE 2 PUFFS BY MOUTH 2 TIMES DAILY. 3 Inhaler 4  . Multiple Vitamin (MULTIVITAMIN) tablet Take 1 tablet by mouth daily.    . ranitidine (ZANTAC) 150 MG tablet Take 150 mg by mouth 2 (two) times daily.    Marland Kitchen SYNTHROID 125 MCG tablet Take 1 tablet (125 mcg total) by mouth daily before breakfast.  45 tablet 5  . zolpidem (AMBIEN) 5 MG tablet Take 1 tablet (5 mg total) by mouth at bedtime as needed. 90 tablet 1   No current facility-administered medications on file prior to visit.    Allergies  Allergen Reactions  . Bactrim   . Penicillins    FH:  Diabetes in brother, MGM  HTN in father, maternal uncle  HL in father  Heart disease in mother, maternal aunt, maternal uncle  Thyroid problems-see HPI  Cancer in maternal aunt and first cousin: Multiple myeloma, lymphoma, respectively  PE: BP 118/80   Pulse 74   Ht 5' 3" (1.6 m)   Wt 211 lb (95.7 kg)   SpO2 97%   BMI 37.38 kg/m  Wt Readings from Last 3 Encounters:  02/15/18 211 lb (95.7 kg)  08/15/17 214 lb 9.6 oz (97.3 kg)  05/30/17 214 lb (97.1 kg)   Constitutional: overweight, in NAD Eyes: PERRLA, EOMI, no exophthalmos ENT: moist mucous membranes, no thyromegaly, no cervical lymphadenopathy Cardiovascular: RRR, No MRG Respiratory: CTA B Gastrointestinal: abdomen soft, NT, ND, BS+ Musculoskeletal: no deformities, strength intact in all 4 Skin: moist, warm, no rashes Neurological: no tremor with outstretched hands, DTR normal in all 4  ASSESSMENT: 1. Hypothyroidism  PLAN:  1. Patient with long-standing acquired hypothyroidism, on levothyroxine therapy.  TSH levels have been fluctuating in the past, most likely due to incorrect dosing: She was taking levothyroxine occasionally along with multivitamins and occasionally with coffee + creamer.  We separated levothyroxine from all these and her TSH decreased so we had to back off her levothyroxine dose.  At last visit, we decreased the dose from 137 mcg to 125 mcg daily.  Subsequent TFTs obtained in 10/2017 were normal. - she continues on LT4 125 mcg daily - pt feels good on this dose.  At last visit, she was describing heat intolerance and sweating - these have improved. - we discussed about taking the thyroid hormone every day, with water, >30 minutes before  breakfast, separated by >4 hours from acid reflux medications, calcium, iron, multivitamins. Pt. is taking it correctly. - will check thyroid tests today: TSH and fT4 - If labs are abnormal, she will need to return for repeat TFTs in 1.5 months - I will see her back in 1 year for a visit and 6 months for repeat labs.  Needs refills for 3 mo.   - time spent with the patient: 15 min, of which >50% was spent in obtaining information about her symptoms, reviewing her previous labs, evaluations, and treatments, counseling her about her condition (please see the discussed topics above), and developing a plan to  further investigate and treat it; she had a number of questions which I addressed.  Office Visit on 02/15/2018  Component Date Value Ref Range Status  . TSH 02/15/2018 0.77  0.35 - 4.50 uIU/mL Final  . Free T4 02/15/2018 1.12  0.60 - 1.60 ng/dL Final   Comment: Specimens from patients who are undergoing biotin therapy and /or ingesting biotin supplements may contain high levels of biotin.  The higher biotin concentration in these specimens interferes with this Free T4 assay.  Specimens that contain high levels  of biotin may cause false high results for this Free T4 assay.  Please interpret results in light of the total clinical presentation of the patient.     Normal TFTs.  Philemon Kingdom, MD PhD [facility] Endocrinology

## 2018-02-15 NOTE — Patient Instructions (Addendum)
Please continue Synthroid 125 mcg daily.  Take the thyroid hormone every day, with water, at least 30 minutes before breakfast, separated by at least 4 hours from: - acid reflux medications - calcium - iron - multivitamins  Please stop at the lab.  Please come back for a follow-up appointment in 1 year for a visit and 6 months for labs.

## 2018-02-16 MED ORDER — SYNTHROID 125 MCG PO TABS: 125.0000 ug | ORAL_TABLET | Freq: Every day | ORAL | 3 refills | Status: DC

## 2018-02-21 MED FILL — SYMBICORT 160-4.5 MCG INH: 160-4.5 | 30 days supply | Qty: 10 | Fill #4

## 2018-02-22 MED FILL — SYNTHROID 125 MCG TABLET: 125 | 30 days supply | Qty: 30 | Fill #5

## 2018-03-21 MED FILL — SYNTHROID 125 MCG TABLET: 125 | 30 days supply | Qty: 30 | Fill #6

## 2018-03-21 MED FILL — SYMBICORT 160-4.5 MCG INH: 160-4.5 | 30 days supply | Qty: 10 | Fill #5

## 2018-04-03 ENCOUNTER — Encounter: Payer: Self-pay | Admitting: Family Medicine

## 2018-04-03 ENCOUNTER — Ambulatory Visit: Payer: 59 | Admitting: Family Medicine

## 2018-04-03 ENCOUNTER — Encounter: Payer: 59 | Admitting: Family Medicine

## 2018-04-03 VITALS — BP 138/92 | HR 71 | Temp 98.4°F | Ht 62.75 in | Wt 214.2 lb

## 2018-04-03 DIAGNOSIS — R030 Elevated blood-pressure reading, without diagnosis of hypertension: Secondary | ICD-10-CM

## 2018-04-03 DIAGNOSIS — R600 Localized edema: Secondary | ICD-10-CM

## 2018-04-03 DIAGNOSIS — J4520 Mild intermittent asthma, uncomplicated: Secondary | ICD-10-CM

## 2018-04-03 DIAGNOSIS — J0100 Acute maxillary sinusitis, unspecified: Secondary | ICD-10-CM | POA: Diagnosis not present

## 2018-04-03 DIAGNOSIS — E039 Hypothyroidism, unspecified: Secondary | ICD-10-CM | POA: Diagnosis not present

## 2018-04-03 MED ORDER — LEVOFLOXACIN 500 MG PO TABS: 500.0000 mg | ORAL_TABLET | Freq: Every day | ORAL | 0 refills | Status: DC

## 2018-04-03 MED FILL — levoFLOXacin 500 MG TABS: 500 | 7 days supply | Qty: 7 | Fill #0

## 2018-04-03 NOTE — Patient Instructions (Signed)
Please keep an eye on your blood pressure. Let me know if consistently high.

## 2018-04-03 NOTE — Progress Notes (Signed)
Susan Burch is a 62 y.o. female is here to [name].   Patient Care Team: Briscoe Deutscher, DO as PCP - General (Family Medicine) Lyndal Pulley, DO as Consulting Physician (Family Medicine) Philemon Kingdom, MD as Consulting Physician (Internal Medicine) Mosetta Anis, MD as Referring Physician (Allergy) Servando Salina, MD as Consulting Physician (Obstetrics and Gynecology)   History of Present Illness:   Sinusitis  This is a new problem. The current episode started 1 to 4 weeks ago. The problem has been gradually worsening since onset. There has been no fever. The pain is moderate. Associated symptoms include chills, congestion and sinus pressure. Past treatments include nothing.   RN at [employer], works weekends. Followed by Dr. Tamala Burch, SM. GYN is Dr. Garwin Burch. HM up to date. Sees Dr. Donneta Burch for allergies. Dr. Renne Burch following thyroid.   Health Maintenance Due  Topic Date Due  . HIV Screening  08/08/1972   No flowsheet data found.  PMHx, SurgHx, SocialHx, Medications, and Allergies were reviewed in the Visit Navigator and updated as appropriate.   Past Medical History:  Diagnosis Date  . GERD without esophagitis   . HLD (hyperlipidemia)   . Hypothyroidism      Past Surgical History:  Procedure Laterality Date  . BREAST EXCISIONAL BIOPSY Right 1990  . [place]  . CHOLECYSTECTOMY  1998  . MYOMECTOMY  1993     Family History  Problem Relation Age of Onset  . Heart Problems Mother   . Hypothyroidism Mother   . High blood pressure Father   . High Cholesterol Father   . Diabetes Brother   . Diabetes Maternal Grandmother   . High blood pressure Maternal Uncle   . Atrial fibrillation Maternal Uncle   . Hypothyroidism Maternal Uncle   . Heart Problems Maternal Aunt   . Cancer Maternal Aunt   . Hypothyroidism Maternal Aunt   . Cancer Cousin   . Breast cancer Cousin   . Hypothyroidism Daughter   . Hypothyroidism Son      Social History   Tobacco Use  . Smoking status: Former Smoker    Packs/day: 0.50    Years: 4.00    Pack years: 2.00    Types: Cigarettes    Last attempt to quit: 07/12/2004    Years since quitting: 13.7  . Smokeless tobacco: Never Used  Substance Use Topics  . Alcohol use: Yes    Alcohol/week: 14.0 standard drinks    Types: 14 Glasses of wine per week    Comment: 2 glasses per day  . Drug use: Never    Current Medications and Allergies:   .  albuterol (PROVENTIL HFA) 108 (90 Base) MCG/ACT inhaler, Inhale 2 puffs into the lungs every 6 (six) hours as needed., Disp: 1 Inhaler, Rfl: 1 .  budesonide-formoterol (SYMBICORT) 160-4.5 MCG/ACT inhaler, INHALE 2 PUFFS BY MOUTH 2 TIMES DAILY., Disp: 3 Inhaler, Rfl: 4 .  Multiple Vitamin (MULTIVITAMIN) tablet, Take 1 tablet by mouth daily., Disp: , Rfl:  .  SYNTHROID 125 MCG tablet, Take 1 tablet (125 mcg total) by mouth daily before breakfast., Disp: 90 tablet, Rfl: 3 .  zolpidem (AMBIEN) 5 MG tablet, Take 1 tablet (5 mg total) by mouth at bedtime as needed., Disp: 90 tablet, Rfl: 1 .  hydrochlorothiazide (HYDRODIURIL) 25 MG tablet, Take 1 tablet (25 mg total) by mouth daily., Disp: 90 tablet, Rfl: 3   Allergies  Allergen Reactions  . Bactrim   . Penicillins  Review of Systems:   Pertinent items are noted in the HPI. Otherwise, a complete ROS is negative.  Vitals:   Vitals:   04/03/18 1309  BP: (!) 138/92  Pulse: 71  Temp: 98.4 F (36.9 C)  TempSrc: Oral  SpO2: 96%  Weight: 214 lb 3.2 oz (97.2 kg)  Height: 5' 2.75" (1.594 m)     Body mass index is 38.25 kg/m.  Physical Exam:   Physical Exam Vitals signs and nursing note reviewed.  HENT:     Head: Normocephalic and atraumatic.     Nose:     Right Sinus: Maxillary sinus tenderness present.     Left Sinus: Maxillary sinus tenderness present.  Eyes:     Pupils: Pupils are equal, round, and reactive to light.  Neck:     Musculoskeletal: Normal range of motion  and neck supple.  Cardiovascular:     Rate and Rhythm: Normal rate and regular rhythm.     Heart sounds: Normal heart sounds.  Pulmonary:     Effort: Pulmonary effort is normal.  Abdominal:     Palpations: Abdomen is soft.  Skin:    General: Skin is warm.  Psychiatric:        Behavior: Behavior normal.    Results for orders placed or performed in visit on 02/15/18  TSH  Result Value Ref Range   TSH 0.77 0.35 - 4.50 uIU/mL  T4, free  Result Value Ref Range   Free T4 1.12 0.60 - 1.60 ng/dL   Assessment and Plan:   Susan Burch was seen today for establish care.  Diagnoses and all orders for this visit:  Subacute maxillary sinusitis -     levofloxacin (LEVAQUIN) 500 MG tablet; Take 1 tablet (500 mg total) by mouth daily.  Mild intermittent asthma without complication  Hypothyroidism (acquired)  Bilateral lower extremity edema -     hydrochlorothiazide (HYDRODIURIL) 25 MG tablet; Take 1 tablet (25 mg total) by mouth daily. -     potassium chloride (K-DUR) 10 MEQ tablet; TAKE ONE TAB DAILY WITH HCTZ  Blood pressure elevated without history of HTN    . Orders and follow up as documented in [place], reviewed diet, exercise and weight control, cardiovascular risk and specific lipid/LDL goals reviewed, reviewed medications and side effects in detail.  . Reviewed expectations re: course of current medical issues. . Outlined signs and symptoms indicating need for more acute intervention. . Patient verbalized understanding and all questions were answered. . Patient received an After Visit Summary.  Briscoe Deutscher, DO [place], [facility] 04/11/2018

## 2018-04-04 ENCOUNTER — Encounter: Payer: Self-pay | Admitting: Family Medicine

## 2018-04-04 MED ORDER — POTASSIUM CHLORIDE ER 10 MEQ PO TBCR: EXTENDED_RELEASE_TABLET | ORAL | 0 refills | Status: DC

## 2018-04-04 MED ORDER — HYDROCHLOROTHIAZIDE 25 MG PO TABS: 25.0000 mg | ORAL_TABLET | Freq: Every day | ORAL | 3 refills | Status: DC

## 2018-04-04 MED FILL — HYDROCHLOROTHIAZIDE 25 MG T: 25 | 90 days supply | Qty: 90 | Fill #0

## 2018-04-04 MED FILL — POTASSIUM CHLORIDE CRYS ER: 10 | 90 days supply | Qty: 90 | Fill #0

## 2018-04-11 ENCOUNTER — Encounter: Payer: Self-pay | Admitting: Family Medicine

## 2018-04-11 DIAGNOSIS — R600 Localized edema: Secondary | ICD-10-CM | POA: Insufficient documentation

## 2018-04-11 DIAGNOSIS — R030 Elevated blood-pressure reading, without diagnosis of hypertension: Secondary | ICD-10-CM | POA: Insufficient documentation

## 2018-04-23 MED FILL — SYNTHROID 125 MCG TABLET: 125 | 30 days supply | Qty: 30 | Fill #7

## 2018-05-01 MED FILL — ZOLPIDEM TARTRATE 5 MG TAB: 5 | 90 days supply | Qty: 90 | Fill #1

## 2018-05-01 MED FILL — SYMBICORT 160-4.5 MCG INH: 160-4.5 | 30 days supply | Qty: 10 | Fill #6

## 2018-05-25 MED FILL — SYNTHROID 125 MCG TABLET: 125 | 30 days supply | Qty: 30 | Fill #8

## 2018-06-05 ENCOUNTER — Ambulatory Visit (INDEPENDENT_AMBULATORY_CARE_PROVIDER_SITE_OTHER): Payer: 59 | Admitting: Family Medicine

## 2018-06-05 ENCOUNTER — Encounter: Payer: Self-pay | Admitting: Family Medicine

## 2018-06-05 ENCOUNTER — Other Ambulatory Visit (HOSPITAL_COMMUNITY)
Admission: RE | Admit: 2018-06-05 | Discharge: 2018-06-05 | Disposition: A | Discharge status: Home / Self Care | Payer: 59 | Source: Ambulatory Visit | Attending: Family Medicine | Admitting: Family Medicine

## 2018-06-05 VITALS — BP 118/86 | HR 70 | Temp 97.8°F | Ht 62.5 in | Wt 213.2 lb

## 2018-06-05 DIAGNOSIS — Z13220 Encounter for screening for lipoid disorders: Secondary | ICD-10-CM

## 2018-06-05 DIAGNOSIS — Z114 Encounter for screening for human immunodeficiency virus [HIV]: Secondary | ICD-10-CM

## 2018-06-05 DIAGNOSIS — E039 Hypothyroidism, unspecified: Secondary | ICD-10-CM

## 2018-06-05 DIAGNOSIS — Z1211 Encounter for screening for malignant neoplasm of colon: Secondary | ICD-10-CM

## 2018-06-05 DIAGNOSIS — Z0000 Encounter for general adult medical examination without abnormal findings: Secondary | ICD-10-CM | POA: Diagnosis not present

## 2018-06-05 DIAGNOSIS — R600 Localized edema: Secondary | ICD-10-CM | POA: Diagnosis not present

## 2018-06-05 DIAGNOSIS — Z124 Encounter for screening for malignant neoplasm of cervix: Secondary | ICD-10-CM | POA: Insufficient documentation

## 2018-06-05 DIAGNOSIS — I517 Cardiomegaly: Secondary | ICD-10-CM

## 2018-06-05 LAB — CBC WITH DIFFERENTIAL/PLATELET
Basophils Absolute: 0 10*3/uL (ref 0.0–0.1)
Basophils Relative: 0.5 % (ref 0.0–3.0)
Eosinophils Absolute: 0.2 10*3/uL (ref 0.0–0.7)
Eosinophils Relative: 2.5 % (ref 0.0–5.0)
HCT: 47.8 % — ABNORMAL HIGH (ref 36.0–46.0)
Hemoglobin: 16.2 g/dL — ABNORMAL HIGH (ref 12.0–15.0)
Lymphocytes Relative: 18.4 % (ref 12.0–46.0)
Lymphs Abs: 1.2 10*3/uL (ref 0.7–4.0)
MCHC: 33.8 g/dL (ref 30.0–36.0)
MCV: 96.6 fl (ref 78.0–100.0)
Monocytes Absolute: 0.7 10*3/uL (ref 0.1–1.0)
Monocytes Relative: 10 % (ref 3.0–12.0)
Neutro Abs: 4.6 10*3/uL (ref 1.4–7.7)
Neutrophils Relative %: 68.6 % (ref 43.0–77.0)
Platelets: 258 10*3/uL (ref 150.0–400.0)
RBC: 4.95 Mil/uL (ref 3.87–5.11)
RDW: 14.6 % (ref 11.5–15.5)
WBC: 6.7 10*3/uL (ref 4.0–10.5)

## 2018-06-05 LAB — LIPID PANEL
Cholesterol: 252 mg/dL — ABNORMAL HIGH (ref 0–200)
HDL: 52.5 mg/dL (ref >39.00)
LDL Cholesterol: 163 mg/dL — ABNORMAL HIGH (ref 0–99)
NonHDL: 199.32
Total CHOL/HDL Ratio: 5
Triglycerides: 184 mg/dL — ABNORMAL HIGH (ref 0.0–149.0)
VLDL: 36.8 mg/dL (ref 0.0–40.0)

## 2018-06-05 LAB — COMPREHENSIVE METABOLIC PANEL
ALT: 28 U/L (ref 0–35)
AST: 24 U/L (ref 0–37)
Albumin: 4.3 g/dL (ref 3.5–5.2)
Alkaline Phosphatase: 78 U/L (ref 39–117)
BUN: 12 mg/dL (ref 6–23)
CO2: 28 mEq/L (ref 19–32)
Calcium: 9.1 mg/dL (ref 8.4–10.5)
Chloride: 103 mEq/L (ref 96–112)
Creatinine, Ser: 1.05 mg/dL (ref 0.40–1.20)
GFR: 53.31 mL/min — ABNORMAL LOW (ref >60.00)
Glucose, Bld: 102 mg/dL — ABNORMAL HIGH (ref 70–99)
Potassium: 4.1 mEq/L (ref 3.5–5.1)
Sodium: 140 mEq/L (ref 135–145)
Total Bilirubin: 0.6 mg/dL (ref 0.2–1.2)
Total Protein: 6.4 g/dL (ref 6.0–8.3)

## 2018-06-05 LAB — TSH: TSH: 1.05 u[IU]/mL (ref 0.35–4.50)

## 2018-06-05 LAB — T4, FREE: Free T4: 1.03 ng/dL (ref 0.60–1.60)

## 2018-06-05 NOTE — Progress Notes (Signed)
Subjective:    Susan Burch is a 63 y.o. female and is here for a comprehensive physical exam.  Health Maintenance Due  Topic Date Due  . HIV Screening  08/08/1970   Current Outpatient Medications:  .  albuterol (PROVENTIL HFA) 108 (90 Base) MCG/ACT inhaler, Inhale 2 puffs into the lungs every 6 (six) hours as needed., Disp: 1 Inhaler, Rfl: 1 .  budesonide-formoterol (SYMBICORT) 160-4.5 MCG/ACT inhaler, INHALE 2 PUFFS BY MOUTH 2 TIMES DAILY., Disp: 3 Inhaler, Rfl: 4 .  Multiple Vitamin (MULTIVITAMIN) tablet, Take 1 tablet by mouth daily., Disp: , Rfl:  .  SYNTHROID 125 MCG tablet, Take 1 tablet (125 mcg total) by mouth daily before breakfast., Disp: 90 tablet, Rfl: 3 .  zolpidem (AMBIEN) 5 MG tablet, Take 1 tablet (5 mg total) by mouth at bedtime as needed., Disp: 90 tablet, Rfl: 1 .  hydrochlorothiazide (HYDRODIURIL) 25 MG tablet, Take 1 tablet (25 mg total) by mouth daily. (Patient not taking: Reported on 06/05/2018), Disp: 90 tablet, Rfl: 3 .  potassium chloride (K-DUR) 10 MEQ tablet, TAKE ONE TAB DAILY WITH HCTZ (Patient not taking: Reported on 06/05/2018), Disp: 90 tablet, Rfl: 0  PMHx, SurgHx, SocialHx, Medications, and Allergies were reviewed in the Visit Navigator and updated as appropriate.   Past Medical History:  Diagnosis Date  . GERD without esophagitis   . HLD (hyperlipidemia)   . Hypothyroidism      Past Surgical History:  Procedure Laterality Date  . BREAST EXCISIONAL BIOPSY Right 1990  . [place]  . CHOLECYSTECTOMY  1998  . MYOMECTOMY  1993     Family History  Problem Relation Age of Onset  . Heart Problems Mother   . Hypothyroidism Mother   . High blood pressure Father   . High Cholesterol Father   . Diabetes Brother   . Diabetes Maternal Grandmother   . High blood pressure Maternal Uncle   . Atrial fibrillation Maternal Uncle   . Hypothyroidism Maternal Uncle   . Heart Problems Maternal Aunt   . Cancer Maternal Aunt   .  Hypothyroidism Maternal Aunt   . Cancer Cousin   . Breast cancer Cousin   . Hypothyroidism Daughter   . Hypothyroidism Son     Social History   Tobacco Use  . Smoking status: Former Smoker    Packs/day: 0.50    Years: 4.00    Pack years: 2.00    Types: Cigarettes    Last attempt to quit: 07/12/2004    Years since quitting: 13.9  . Smokeless tobacco: Never Used  Substance Use Topics  . Alcohol use: Yes    Alcohol/week: 14.0 standard drinks    Types: 14 Glasses of wine per week    Comment: 2 glasses per day  . Drug use: Never    Review of Systems:   Pertinent items are noted in the HPI. Otherwise, ROS is negative.  Objective:   BP 118/86 (BP Location: Left Arm, Patient Position: Sitting, Cuff Size: Large)   Pulse 70   Temp 97.8 F (36.6 C) (Oral)   Ht 5' 2.5" (1.588 m)   Wt 213 lb 4 oz (96.7 kg)   SpO2 96%   BMI 38.38 kg/m   General appearance: alert, cooperative and appears stated age. Head: normocephalic, without obvious abnormality, atraumatic. Neck: no adenopathy, supple, symmetrical, trachea midline; thyroid not enlarged, symmetric, no tenderness/mass/nodules. Lungs: clear to auscultation bilaterally. Heart: regular rate and rhythm Abdomen: soft, non-tender; no masses,  no  organomegaly. Extremities: extremities normal, atraumatic, no cyanosis or edema. Skin: skin color, texture, turgor normal, no rashes or lesions. Lymph: cervical, supraclavicular, and axillary nodes normal; no abnormal inguinal nodes palpated. Neurologic: grossly normal.  Pelvic:  External genitalia: no lesions. Urethra: normal appearing urethra with no masses, tenderness or lesions. Bartholin's and Skene's: normal. Vagina: normal appearing vagina with normal color and discharge, small skin tag on left labia. Cervix: normal appearance. Pap and high risk HPV testing done: Yes.   Uterus: uterus is normal size, shape, consistency and nontender. Adnexa: normal adnexa in size, nontender and no  masses.                                      Assessment/Plan:   Susan Burch was seen today for annual exam.  Diagnoses and all orders for this visit:  Routine physical examination  Hypothyroidism (acquired) -     CBC with Differential/Platelet -     Comprehensive metabolic panel -     TSH -     T4, free  Mild cardiomegaly  Screening for lipid disorders -     Lipid panel  Screening for malignant neoplasm of colon -     Ambulatory referral to Gastroenterology  Pap smear for cervical cancer screening -     Cytology - PAP( [place])  Screening for HIV (human immunodeficiency virus) -     HIV Antibody (routine testing w rflx)   Patient Counseling: [x]    Nutrition: Stressed importance of moderation in sodium/caffeine intake, saturated fat and cholesterol, caloric balance, sufficient intake of fresh fruits, vegetables, fiber, calcium, iron, and 1 mg of folate supplement per day (for females capable of pregnancy).  [x]    Stressed the importance of regular exercise.   [x]    Substance Abuse: Discussed cessation/primary prevention of tobacco, alcohol, or other drug use; driving or other dangerous activities under the influence; availability of treatment for abuse.   [x]    Injury prevention: Discussed safety belts, safety helmets, smoke detector, smoking near bedding or upholstery.   [x]    Sexuality: Discussed sexually transmitted diseases, partner selection, use of condoms, avoidance of unintended pregnancy  and contraceptive alternatives.  [x]    Dental health: Discussed importance of regular tooth brushing, flossing, and dental visits.  [x]    Health maintenance and immunizations reviewed. Please refer to Health maintenance section.   Briscoe Deutscher, DO [place]

## 2018-06-05 NOTE — Addendum Note (Signed)
Addended by: Briscoe Deutscher R on: 06/05/2018 09:33 AM   Modules accepted: Orders

## 2018-06-06 ENCOUNTER — Other Ambulatory Visit: Payer: Self-pay | Admitting: Family Medicine

## 2018-06-06 LAB — CYTOLOGY - PAP
Diagnosis: NEGATIVE
HPV: NOT DETECTED

## 2018-06-06 LAB — HIV ANTIBODY (ROUTINE TESTING W REFLEX): HIV 1&2 Ab, 4th Generation: NONREACTIVE

## 2018-06-06 MED ORDER — BUDESONIDE-FORMOTEROL FUMARATE 160-4.5 MCG/ACT IN AERO: INHALATION_SPRAY | RESPIRATORY_TRACT | 4 refills | Status: DC

## 2018-06-06 MED FILL — SYMBICORT 160-4.5 MCG INH: 160-4.5 | 90 days supply | Qty: 31 | Fill #0

## 2018-06-06 NOTE — Telephone Encounter (Signed)
Requested Prescriptions  Pending Prescriptions Disp Refills  . budesonide-formoterol (SYMBICORT) 160-4.5 MCG/ACT inhaler 3 Inhaler 4    Sig: INHALE 2 PUFFS BY MOUTH 2 TIMES DAILY.     Pulmonology:  Combination Products Passed - 06/06/2018  2:19 PM      Passed - Valid encounter within last 12 months    Recent Outpatient Visits          Yesterday Routine physical examination   [place], DO   2 months ago Subacute maxillary sinusitis   [place], [place], DO   1 year ago Mild cardiomegaly   [facility] [place], MD   1 year ago Nonallopathic lesion of thoracic region   [place], [place], DO   1 year ago Mild intermittent asthma without complication   [place] [facility], Doretha Sou, MD      Future Appointments            In 3 months Briscoe Deutscher, [place], [facility]

## 2018-06-06 NOTE — Telephone Encounter (Signed)
Copied from [place] [id number]. Topic: Quick Communication - Rx Refill/Question >> Jun 06, 2018  2:17 PM Selinda Flavin B, [place] wrote: Medication: budesonide-formoterol (SYMBICORT) 160-4.5 MCG/ACT inhaler   Has the patient contacted their pharmacy? Yes.   (Agent: If no, request that the patient contact the pharmacy for the refill.) (Agent: If yes, when and what did the pharmacy advise?)  Preferred Pharmacy (with phone number or street name): [place], [place]: Please be advised that RX refills may take up to 3 business days. We ask that you follow-up with your pharmacy.

## 2018-06-08 ENCOUNTER — Encounter: Payer: Self-pay | Admitting: Family Medicine

## 2018-06-08 DIAGNOSIS — R7989 Other specified abnormal findings of blood chemistry: Secondary | ICD-10-CM

## 2018-06-08 MED ORDER — FUROSEMIDE 20 MG PO TABS
20.0000 mg | ORAL_TABLET | Freq: Every day | ORAL | 3 refills | Status: DC
Start: 2018-06-08 — End: 2018-12-19

## 2018-06-08 MED ORDER — POTASSIUM CHLORIDE CRYS ER 20 MEQ PO TBCR: 20.0000 meq | EXTENDED_RELEASE_TABLET | Freq: Every day | ORAL | 3 refills | Status: DC

## 2018-06-08 MED FILL — FUROSEMIDE 20 MG TABS: 20 | 30 days supply | Qty: 30 | Fill #0

## 2018-06-08 NOTE — Addendum Note (Signed)
Addended by: Briscoe Deutscher R on: 06/08/2018 11:05 AM   Modules accepted: Orders

## 2018-06-09 MED ORDER — ROSUVASTATIN CALCIUM 5 MG PO TABS: 5.0000 mg | ORAL_TABLET | Freq: Every day | ORAL | 3 refills | Status: DC

## 2018-06-11 MED FILL — ROSUVASTATIN CALCIUM 5 MG T: 5 | 90 days supply | Qty: 90 | Fill #0

## 2018-06-12 ENCOUNTER — Other Ambulatory Visit: Payer: Self-pay

## 2018-06-12 DIAGNOSIS — R7989 Other specified abnormal findings of blood chemistry: Secondary | ICD-10-CM

## 2018-06-12 NOTE — Progress Notes (Signed)
re

## 2018-06-13 ENCOUNTER — Ambulatory Visit (HOSPITAL_COMMUNITY)
Admission: RE | Admit: 2018-06-13 | Discharge: 2018-06-13 | Disposition: A | Discharge status: Home / Self Care | Payer: 59 | Source: Ambulatory Visit | Attending: Family Medicine | Admitting: Family Medicine

## 2018-06-13 DIAGNOSIS — R600 Localized edema: Secondary | ICD-10-CM | POA: Insufficient documentation

## 2018-06-13 DIAGNOSIS — I10 Essential (primary) hypertension: Secondary | ICD-10-CM | POA: Diagnosis not present

## 2018-06-13 DIAGNOSIS — I517 Cardiomegaly: Secondary | ICD-10-CM | POA: Insufficient documentation

## 2018-06-19 ENCOUNTER — Encounter: Payer: Self-pay | Admitting: Family Medicine

## 2018-06-20 ENCOUNTER — Other Ambulatory Visit: Payer: Self-pay

## 2018-06-20 DIAGNOSIS — Z122 Encounter for screening for malignant neoplasm of respiratory organs: Secondary | ICD-10-CM

## 2018-06-20 NOTE — Progress Notes (Signed)
ct 

## 2018-06-26 ENCOUNTER — Other Ambulatory Visit: Payer: Self-pay | Admitting: Internal Medicine

## 2018-06-26 MED FILL — SYNTHROID 125 MCG TABLET: 125 | 45 days supply | Qty: 45 | Fill #0 | Status: TO

## 2018-07-09 ENCOUNTER — Encounter: Payer: Self-pay | Admitting: Family Medicine

## 2018-07-11 ENCOUNTER — Other Ambulatory Visit: Payer: Self-pay

## 2018-07-11 ENCOUNTER — Telehealth: Payer: Self-pay | Admitting: Family Medicine

## 2018-07-11 DIAGNOSIS — J4520 Mild intermittent asthma, uncomplicated: Secondary | ICD-10-CM

## 2018-07-11 MED ORDER — ALBUTEROL SULFATE HFA 108 (90 BASE) MCG/ACT IN AERS: 2.0000 | INHALATION_SPRAY | Freq: Four times a day (QID) | RESPIRATORY_TRACT | 1 refills | Status: DC | PRN

## 2018-07-11 MED FILL — PROVENTIL HFA 108 (90 BASE): 108 (90 BAS | 25 days supply | Qty: 7 | Fill #0

## 2018-07-11 MED FILL — FUROSEMIDE 20 MG TABS: 20 | 30 days supply | Qty: 30 | Fill #1 | Status: TO

## 2018-07-11 NOTE — Telephone Encounter (Signed)
Copied from [place] [id number]. Topic: Quick Communication - See Telephone Encounter >> Jul 11, 2018  9:56 AM Ivar Drape wrote: CRM for notification. See Telephone encounter for: 07/11/18. Patient would like a refill on her albuterol (PROVENTIL HFA) 108 (90 Base) MCG/ACT inhaler and have it sent to her preferred pharmacy [facility] on [address].

## 2018-07-11 NOTE — Telephone Encounter (Signed)
See note

## 2018-07-11 NOTE — Telephone Encounter (Signed)
Done

## 2018-07-16 ENCOUNTER — Other Ambulatory Visit: Payer: Self-pay | Admitting: Family Medicine

## 2018-07-16 DIAGNOSIS — G4700 Insomnia, unspecified: Secondary | ICD-10-CM

## 2018-07-16 DIAGNOSIS — E038 Other specified hypothyroidism: Secondary | ICD-10-CM

## 2018-07-16 DIAGNOSIS — Z0000 Encounter for general adult medical examination without abnormal findings: Secondary | ICD-10-CM

## 2018-07-16 MED ORDER — ZOLPIDEM TARTRATE 5 MG PO TABS
5.0000 mg | ORAL_TABLET | Freq: Every evening | ORAL | 1 refills | Status: DC | PRN
Start: 2018-07-16 — End: 2019-01-25

## 2018-07-16 NOTE — Telephone Encounter (Signed)
Copied from [place] [id number]. Topic: Quick Communication - Rx Refill/Question >> Jul 16, 2018  1:48 PM Bea Graff, NT wrote: Medication: zolpidem (AMBIEN) 5 MG tablet  Has the patient contacted their pharmacy? Yes.   (Agent: If no, request that the patient contact the pharmacy for the refill.) (Agent: If yes, when and what did the pharmacy advise?)  Preferred Pharmacy (with phone number or street name): [place], [place] - [place] [phone] (Phone) [phone] (Fax)    Agent: Please be advised that RX refills may take up to 3 business days. We ask that you follow-up with your pharmacy.

## 2018-07-16 NOTE — Telephone Encounter (Signed)
See note

## 2018-07-16 NOTE — Telephone Encounter (Signed)
Lst OV 06/05/2018  Last refill 01/30/2018 #90/1 Next OV 09/05/2018  Forwarding to Dr. Juleen China

## 2018-07-26 MED FILL — ZOLPIDEM TARTRATE 5 MG TAB: 5 | 90 days supply | Qty: 90 | Fill #0

## 2018-08-06 MED FILL — SYNTHROID 125 MCG TABLET: 125 | 90 days supply | Qty: 90 | Fill #0

## 2018-08-15 ENCOUNTER — Other Ambulatory Visit: Payer: 59

## 2018-08-15 ENCOUNTER — Other Ambulatory Visit: Payer: Self-pay

## 2018-08-31 ENCOUNTER — Telehealth: Payer: Self-pay | Admitting: Family Medicine

## 2018-08-31 NOTE — Telephone Encounter (Signed)
Called pt and advised that labs can be discussed and ordered at the time of the virtual visit and then she can come in for labs only after orders have been placed. This will limit the amount of time that she spends in the office. Pt verbalized understanding. Virtual visit scheduled for 09/05/18.

## 2018-08-31 NOTE — Telephone Encounter (Signed)
Pt cancelled 3 month f.u scheduled for 09/05/2018. Pt wants to know if labs would  be put in if there is a virtual visit. Please advise.

## 2018-09-04 NOTE — Progress Notes (Signed)
Virtual Visit via Video   Due to the COVID-19 pandemic, this visit was completed with telemedicine (audio/video) technology to reduce patient and provider exposure as well as to preserve personal protective equipment.   I connected with Susan Burch by a video enabled telemedicine application and verified that I am speaking with the correct person using two identifiers. Location patient: Home Location provider: [facility], Office Persons participating in the virtual visit: [name], Briscoe Deutscher, DO Lonell Grandchild, CMA acting as scribe for Dr. Briscoe Deutscher.   I discussed the limitations of evaluation and management by telemedicine and the availability of in person appointments. The patient expressed understanding and agreed to proceed.  Care Team   Patient Care Team: Briscoe Deutscher, DO as PCP - General (Family Medicine) Lyndal Pulley, DO as Consulting Physician (Family Medicine) Philemon Kingdom, MD as Consulting Physician (Internal Medicine) Mosetta Anis, MD as Referring Physician (Allergy) Servando Salina, MD as Consulting Physician (Obstetrics and Gynecology)  Subjective:   HPI:   Notes recorded by Briscoe Deutscher, DO on 06/08/2018 at 11:09 AM EST Labs are available for you to review. Your kidney labs were fine. I sent in Lasix and potassium for you to take daily as needed for leg swelling. Your hemoglobin is elevated - polycythemia. I can't remember if you've seen a Hematologist about this? Usually related to low oxygen at times (like smoking or sleep apnea). Would you be okay with referral? Pap was normal so can go 3-5 years before the next one. Cholesterol is too high. Work on diet and exercise - what are your thoughts on statin? I'll ask a CMA/LPN to call and check in about all of this.  Patient was not seen by Hematology yet due to Covid -19.   Hypothyroidism (acquired) Labs done 06/05/2018. Taking Synthroid 125 mcg daily.   Mild cardiomegaly  Echocardiogram 06/13/2018. That was stable. We have reviewed results with patient in office.   Mild intermittent asthma without complication       Symbicort daily, Albuterol inhaler prn. She has had increased symptoms. She thinks that it is due to increased allergy symptoms. She is not currently taking anything daily. She does have zyrtec and Flonase only as needed. We gave her instructions to take during allergy symptom time. She was informed it can take up to 4 days to start working.   Review of Systems  Constitutional: Negative for chills, fever, malaise/fatigue and weight loss.  Respiratory: Negative for cough, shortness of breath and wheezing.   Cardiovascular: Negative for chest pain, palpitations and leg swelling.  Gastrointestinal: Negative for abdominal pain, constipation, diarrhea, nausea and vomiting.  Genitourinary: Negative for dysuria and urgency.  Musculoskeletal: Negative for joint pain and myalgias.  Skin: Negative for rash.  Neurological: Negative for dizziness and headaches.  Psychiatric/Behavioral: Negative for depression, substance abuse and suicidal ideas. The patient is not nervous/anxious.     Patient Active Problem List   Diagnosis Date Noted  . Pure hypercholesterolemia 09/05/2018  . Abnormal CBC 09/05/2018  . Bilateral lower extremity edema 04/11/2018  . Blood pressure elevated without history of HTN 04/11/2018  . Mild cardiomegaly 05/30/2017  . Mild intermittent asthma without complication 16/60/6301  . Metatarsalgia 05/13/2015  . Muscle strain of right gluteal region 11/19/2014  . Sacroiliac joint dysfunction of right side 08/11/2014  . Nonallopathic lesion of sacral region 08/11/2014  . Nonallopathic lesion of thoracic region 08/11/2014  . Nonallopathic lesion of lumbosacral region 08/11/2014  . Postmenopausal disorder 02/14/2013  . Sleep  initiation dysfunction 09/27/2011  . Hypothyroidism (acquired) 11/06/2006  . Allergic rhinitis 11/06/2006  . GERD  11/06/2006    Social History   Tobacco Use  . Smoking status: Former Smoker    Packs/day: 0.50    Years: 4.00    Pack years: 2.00    Types: Cigarettes    Last attempt to quit: 07/12/2004    Years since quitting: 14.1  . Smokeless tobacco: Never Used  Substance Use Topics  . Alcohol use: Yes    Alcohol/week: 14.0 standard drinks    Types: 14 Glasses of wine per week    Comment: 2 glasses per day    Current Outpatient Medications:  .  albuterol (PROVENTIL HFA) 108 (90 Base) MCG/ACT inhaler, Inhale 2 puffs into the lungs every 6 (six) hours as needed., Disp: 3 Inhaler, Rfl: 1 .  budesonide-formoterol (SYMBICORT) 160-4.5 MCG/ACT inhaler, INHALE 2 PUFFS BY MOUTH 2 TIMES DAILY., Disp: 3 Inhaler, Rfl: 4 .  furosemide (LASIX) 20 MG tablet, Take 1 tablet (20 mg total) by mouth daily., Disp: 30 tablet, Rfl: 3 .  Multiple Vitamin (MULTIVITAMIN) tablet, Take 1 tablet by mouth daily., Disp: , Rfl:  .  potassium chloride (K-DUR) 10 MEQ tablet, Take 1 tablet by mouth 2 (two) times a day., Disp: , Rfl:  .  rosuvastatin (CRESTOR) 5 MG tablet, Take 1 tablet (5 mg total) by mouth daily., Disp: 90 tablet, Rfl: 3 .  SYNTHROID 125 MCG tablet, TAKE 1 TABLET BY MOUTH DAILY BEFORE BREAKFAST., Disp: 45 tablet, Rfl: 5 .  zolpidem (AMBIEN) 5 MG tablet, Take 1 tablet (5 mg total) by mouth at bedtime as needed., Disp: 90 tablet, Rfl: 1 .  montelukast (SINGULAIR) 10 MG tablet, Take 1 tablet (10 mg total) by mouth at bedtime., Disp: 90 tablet, Rfl: 3  Allergies  Allergen Reactions  . Bactrim   . Penicillins    Objective:   VITALS: Per patient if applicable, see vitals. GENERAL: Alert and in no acute distress. CARDIOPULMONARY: No increased WOB. Speaking in clear sentences.  PSYCH: Pleasant and cooperative. Speech normal rate and rhythm. Affect is appropriate. Insight and judgement are appropriate. Attention is focused, linear, and appropriate.  NEURO: Oriented as arrived to appointment on time with no  prompting.   Depression screen PHQ 2/9 06/05/2018  Decreased Interest 0  Down, Depressed, Hopeless 0  PHQ - 2 Score 0   Assessment and Plan:   Susan Burch was seen today for medication refill.  Diagnoses and all orders for this visit:  Mild cardiomegaly  Mild intermittent asthma with acute exacerbation -     budesonide-formoterol (SYMBICORT) 160-4.5 MCG/ACT inhaler; INHALE 2 PUFFS BY MOUTH 2 TIMES DAILY. -     albuterol (PROVENTIL HFA) 108 (90 Base) MCG/ACT inhaler; Inhale 2 puffs into the lungs every 6 (six) hours as needed. -     CBC with Differential/Platelet; Future  Hypothyroidism (acquired)  Bilateral lower extremity edema  Abnormal CBC  Pure hypercholesterolemia -     Comprehensive metabolic panel; Future -     Lipid panel; Future  Non-seasonal allergic rhinitis due to pollen -     budesonide-formoterol (SYMBICORT) 160-4.5 MCG/ACT inhaler; INHALE 2 PUFFS BY MOUTH 2 TIMES DAILY. -     albuterol (PROVENTIL HFA) 108 (90 Base) MCG/ACT inhaler; Inhale 2 puffs into the lungs every 6 (six) hours as needed. -     montelukast (SINGULAIR) 10 MG tablet; Take 1 tablet (10 mg total) by mouth at bedtime.   Marland Kitchen COVID-19 Education: The signs and  symptoms of COVID-19 were discussed with the patient and how to seek care for testing if needed. The importance of social distancing was discussed today. . Reviewed expectations re: course of current medical issues. . Discussed self-management of symptoms. . Outlined signs and symptoms indicating need for more acute intervention. . Patient verbalized understanding and all questions were answered. Marland Kitchen Health Maintenance issues including appropriate healthy diet, exercise, and smoking avoidance were discussed with patient. . See orders for this visit as documented in the electronic medical record.  Briscoe Deutscher, DO  Records requested if needed. Time spent: 25 minutes, of which >50% was spent in obtaining information about her symptoms, reviewing her  previous labs, evaluations, and treatments, counseling her about her condition (please see the discussed topics above), and developing a plan to further investigate it; she had a number of questions which I addressed.

## 2018-09-05 ENCOUNTER — Ambulatory Visit: Payer: 59 | Admitting: Family Medicine

## 2018-09-05 ENCOUNTER — Encounter: Payer: Self-pay | Admitting: Family Medicine

## 2018-09-05 ENCOUNTER — Ambulatory Visit (INDEPENDENT_AMBULATORY_CARE_PROVIDER_SITE_OTHER): Payer: 59 | Admitting: Family Medicine

## 2018-09-05 ENCOUNTER — Other Ambulatory Visit: Payer: Self-pay

## 2018-09-05 VITALS — Ht 62.5 in | Wt 213.0 lb

## 2018-09-05 DIAGNOSIS — I517 Cardiomegaly: Secondary | ICD-10-CM

## 2018-09-05 DIAGNOSIS — E7800 Pure hypercholesterolemia, unspecified: Secondary | ICD-10-CM | POA: Insufficient documentation

## 2018-09-05 DIAGNOSIS — J301 Allergic rhinitis due to pollen: Secondary | ICD-10-CM

## 2018-09-05 DIAGNOSIS — R7989 Other specified abnormal findings of blood chemistry: Secondary | ICD-10-CM | POA: Diagnosis not present

## 2018-09-05 DIAGNOSIS — E039 Hypothyroidism, unspecified: Secondary | ICD-10-CM | POA: Diagnosis not present

## 2018-09-05 DIAGNOSIS — R600 Localized edema: Secondary | ICD-10-CM | POA: Diagnosis not present

## 2018-09-05 DIAGNOSIS — J4521 Mild intermittent asthma with (acute) exacerbation: Secondary | ICD-10-CM | POA: Diagnosis not present

## 2018-09-05 DIAGNOSIS — D751 Secondary polycythemia: Secondary | ICD-10-CM | POA: Insufficient documentation

## 2018-09-05 MED ORDER — MONTELUKAST SODIUM 10 MG PO TABS: 10.0000 mg | ORAL_TABLET | Freq: Every day | ORAL | 3 refills | Status: DC

## 2018-09-05 MED ORDER — ALBUTEROL SULFATE HFA 108 (90 BASE) MCG/ACT IN AERS: 2.0000 | INHALATION_SPRAY | Freq: Four times a day (QID) | RESPIRATORY_TRACT | 1 refills | Status: DC | PRN

## 2018-09-05 MED ORDER — BUDESONIDE-FORMOTEROL FUMARATE 160-4.5 MCG/ACT IN AERO
INHALATION_SPRAY | RESPIRATORY_TRACT | 4 refills | Status: DC
Start: 2018-09-05 — End: 2019-02-06

## 2018-09-05 MED FILL — ALBUTEROL SULFATE HFA 108 (: 108 (90 BAS | 30 days supply | Qty: 18 | Fill #0

## 2018-09-05 MED FILL — MONTELUKAST SOD 10 MG TAB: 10 | 90 days supply | Qty: 90 | Fill #0

## 2018-09-05 MED FILL — SYMBICORT 160-4.5 MCG INH: 160-4.5 | 90 days supply | Qty: 31 | Fill #0

## 2018-09-05 NOTE — Addendum Note (Signed)
Addended by: Francella Solian on: 09/05/2018 04:06 PM   Modules accepted: Orders

## 2018-09-07 MED FILL — ROSUVASTATIN CALCIUM 5 MG T: 5 | 90 days supply | Qty: 90 | Fill #0

## 2018-09-12 ENCOUNTER — Telehealth: Payer: Self-pay | Admitting: Hematology

## 2018-09-12 NOTE — Telephone Encounter (Signed)
A new hem appt has been scheduled for the pt to see Dr. Irene Limbo on 6/3 at 11am. Pt aware to arrive 15 minutes [place].

## 2018-09-20 MED FILL — FUROSEMIDE 20 MG TABS: 20 | 30 days supply | Qty: 30 | Fill #0

## 2018-09-26 ENCOUNTER — Telehealth: Payer: Self-pay | Admitting: Hematology

## 2018-09-26 ENCOUNTER — Inpatient Hospital Stay: Payer: 59

## 2018-09-26 ENCOUNTER — Other Ambulatory Visit: Payer: Self-pay

## 2018-09-26 ENCOUNTER — Inpatient Hospital Stay: Payer: 59 | Attending: Hematology | Admitting: Hematology

## 2018-09-26 VITALS — BP 140/84 | HR 81 | Temp 98.1°F | Resp 18 | Ht 62.5 in | Wt 215.0 lb

## 2018-09-26 DIAGNOSIS — D751 Secondary polycythemia: Secondary | ICD-10-CM

## 2018-09-26 DIAGNOSIS — Z79899 Other long term (current) drug therapy: Secondary | ICD-10-CM | POA: Insufficient documentation

## 2018-09-26 LAB — CBC WITH DIFFERENTIAL/PLATELET
Abs Immature Granulocytes: 0.01 10*3/uL (ref 0.00–0.07)
Basophils Absolute: 0 10*3/uL (ref 0.0–0.1)
Basophils Relative: 1 %
Eosinophils Absolute: 0.1 10*3/uL (ref 0.0–0.5)
Eosinophils Relative: 2 %
HCT: 44.8 % (ref 36.0–46.0)
Hemoglobin: 15.3 g/dL — ABNORMAL HIGH (ref 12.0–15.0)
Immature Granulocytes: 0 %
Lymphocytes Relative: 21 %
Lymphs Abs: 1.6 10*3/uL (ref 0.7–4.0)
MCH: 32.3 pg (ref 26.0–34.0)
MCHC: 34.2 g/dL (ref 30.0–36.0)
MCV: 94.5 fL (ref 80.0–100.0)
Monocytes Absolute: 0.7 10*3/uL (ref 0.1–1.0)
Monocytes Relative: 10 %
Neutro Abs: 5 10*3/uL (ref 1.7–7.7)
Neutrophils Relative %: 66 %
Platelets: 233 10*3/uL (ref 150–400)
RBC: 4.74 MIL/uL (ref 3.87–5.11)
RDW: 13.4 % (ref 11.5–15.5)
WBC: 7.4 10*3/uL (ref 4.0–10.5)
nRBC: 0 % (ref 0.0–0.2)

## 2018-09-26 LAB — CMP (CANCER CENTER ONLY)
ALT: 33 U/L (ref 0–44)
AST: 32 U/L (ref 15–41)
Albumin: 3.9 g/dL (ref 3.5–5.0)
Alkaline Phosphatase: 83 U/L (ref 38–126)
Anion gap: 9 (ref 5–15)
BUN: 11 mg/dL (ref 8–23)
CO2: 26 mmol/L (ref 22–32)
Calcium: 8.9 mg/dL (ref 8.9–10.3)
Chloride: 105 mmol/L (ref 98–111)
Creatinine: 1.16 mg/dL — ABNORMAL HIGH (ref 0.44–1.00)
GFR, Est AFR Am: 59 mL/min — ABNORMAL LOW (ref >60)
GFR, Estimated: 51 mL/min — ABNORMAL LOW (ref >60)
Glucose, Bld: 104 mg/dL — ABNORMAL HIGH (ref 70–99)
Potassium: 4.3 mmol/L (ref 3.5–5.1)
Sodium: 140 mmol/L (ref 135–145)
Total Bilirubin: 0.6 mg/dL (ref 0.3–1.2)
Total Protein: 6.6 g/dL (ref 6.5–8.1)

## 2018-09-26 NOTE — Telephone Encounter (Signed)
Per 6/3 los RTC with Dr Irene Limbo based on labs

## 2018-09-26 NOTE — Progress Notes (Signed)
HEMATOLOGY/ONCOLOGY CONSULTATION NOTE  Date of Service: 09/26/2018  Patient Care Team: Briscoe Deutscher, DO as PCP - General (Family Medicine) Lyndal Pulley, DO as Consulting Physician (Family Medicine) Philemon Kingdom, MD as Consulting Physician (Internal Medicine) Mosetta Anis, MD as Referring Physician (Allergy) Servando Salina, MD as Consulting Physician (Obstetrics and Gynecology)  CHIEF COMPLAINTS/PURPOSE OF CONSULTATION:  Polycythemia  HISTORY OF PRESENTING ILLNESS:   Susan Burch is a wonderful 63 y.o. female who has been referred to [place] by Dr. Briscoe Deutscher for evaluation and management of Polycythemia. The pt reports that she is doing well overall.   The pt reports that she has asthma and has had some issues wearing a mask since the pandemic began. The pt notes that she hasn't smoked cigarettes in the last 15 years. The pt notes that she has begun Singulair recently. She also began Lasix for leg swelling after her last PCP visit in February 2020, which then resolved. She notes that she has had stable leg swelling for several years. She then had an ECHO which revealed mild cardiomegaly but normal LV EF of 60-65%. She wears compression stockings while working.  The pt notes that she has been on thyroid replacement for a while, and is taking 138mcg Synthroid. She notes that her thyroid levels have been steady for a while.   The pt denies abdominal discomfort or pain. She denies feeling any differently recently as compared to 6-12 months ago. She denies concern for frequent or recent infections.   The pt notes that she takes Ambien as she began having difficulty sleeping when she began menopause. The pt notes that she does snore, and her PCP has reportedly mentioned to the pt the recommendation to have a sleep study.  Most recent lab results (06/05/18) of CBC w/diff and CMP is as follows: all values are WNL except for HGB at 16.2, HCT at 47.8, Glucose at 102, GFR at 53.31.   On review of systems, pt reports some SOB, improved leg swelling, stable energy levels, snoring, and denies abdominal pain or discomfort, concern for frequent infections, concern for recent infections, and any other symptoms.   On PMHx the pt reports asthma, denies blood clots or other vascular events. On Social Hx the pt reports smoking "1/2 ppd off and on from 63 years old to 63 years old." The pt works as a Counselling psychologist.   MEDICAL HISTORY:  Past Medical History:  Diagnosis Date  . GERD without esophagitis   . HLD (hyperlipidemia)   . Hypothyroidism     SURGICAL HISTORY: Past Surgical History:  Procedure Laterality Date  . BREAST EXCISIONAL BIOPSY Right 1990  . [place]  . CHOLECYSTECTOMY  1998  . MYOMECTOMY  1993    SOCIAL HISTORY: Social History   Socioeconomic History  . Marital status: Married    Spouse name: Not on file  . Number of children: Not on file  . Years of education: Not on file  . Highest education level: Not on file  Occupational History  . Occupation: Programmer, multimedia: [employer]  . Financial resource strain: Not on file  . Food insecurity:    Worry: Not on file    Inability: Not on file  . Transportation needs:    Medical: Not on file    Non-medical: Not on file  Tobacco Use  . Smoking status: Former Smoker    Packs/day: 0.50    Years: 4.00  Pack years: 2.00    Types: Cigarettes    Last attempt to quit: 07/12/2004    Years since quitting: 14.2  . Smokeless tobacco: Never Used  Substance and Sexual Activity  . Alcohol use: Yes    Alcohol/week: 14.0 standard drinks    Types: 14 Glasses of wine per week    Comment: 2 glasses per day  . Drug use: Never  . Sexual activity: Yes  Lifestyle  . Physical activity:    Days per week: Not on file    Minutes per session: Not on file  . Stress: Not on file  Relationships  . Social connections:    Talks on phone: Not on file    Gets together: Not  on file    Attends religious service: Not on file    Active member of club or organization: Not on file    Attends meetings of clubs or organizations: Not on file    Relationship status: Not on file  . Intimate partner violence:    Fear of current or ex partner: Not on file    Emotionally abused: Not on file    Physically abused: Not on file    Forced sexual activity: Not on file  Other Topics Concern  . Not on file  Social History Narrative  . Not on file    FAMILY HISTORY: Family History  Problem Relation Age of Onset  . Heart Problems Mother   . Hypothyroidism Mother   . High blood pressure Father   . High Cholesterol Father   . Diabetes Brother   . Diabetes Maternal Grandmother   . High blood pressure Maternal Uncle   . Atrial fibrillation Maternal Uncle   . Hypothyroidism Maternal Uncle   . Heart Problems Maternal Aunt   . Cancer Maternal Aunt   . Hypothyroidism Maternal Aunt   . Cancer Cousin   . Breast cancer Cousin   . Hypothyroidism Daughter   . Hypothyroidism Son     ALLERGIES:  is allergic to bactrim and penicillins.  MEDICATIONS:  Current Outpatient Medications  Medication Sig Dispense Refill  . albuterol (PROVENTIL HFA) 108 (90 Base) MCG/ACT inhaler Inhale 2 puffs into the lungs every 6 (six) hours as needed. 3 Inhaler 1  . budesonide-formoterol (SYMBICORT) 160-4.5 MCG/ACT inhaler INHALE 2 PUFFS BY MOUTH 2 TIMES DAILY. 3 Inhaler 4  . furosemide (LASIX) 20 MG tablet Take 1 tablet (20 mg total) by mouth daily. 30 tablet 3  . montelukast (SINGULAIR) 10 MG tablet Take 1 tablet (10 mg total) by mouth at bedtime. 90 tablet 3  . Multiple Vitamin (MULTIVITAMIN) tablet Take 1 tablet by mouth daily.    . potassium chloride (K-DUR) 10 MEQ tablet Take 1 tablet by mouth 2 (two) times a day.    . rosuvastatin (CRESTOR) 5 MG tablet Take 1 tablet (5 mg total) by mouth daily. 90 tablet 3  . SYNTHROID 125 MCG tablet TAKE 1 TABLET BY MOUTH DAILY BEFORE BREAKFAST. 45 tablet  5  . zolpidem (AMBIEN) 5 MG tablet Take 1 tablet (5 mg total) by mouth at bedtime as needed. 90 tablet 1   No current facility-administered medications for this visit.     REVIEW OF SYSTEMS:    10 Point review of Systems was done is negative except as noted above.  PHYSICAL EXAMINATION:  . Vitals:   09/26/18 1102  BP: 140/84  Pulse: 81  Resp: 18  Temp: 98.1 F (36.7 C)  SpO2: 97%   Filed Weights  09/26/18 1102  Weight: 215 lb (97.5 kg)   .Body mass index is 38.7 kg/m.  GENERAL:alert, in no acute distress and comfortable SKIN: no acute rashes, no significant lesions EYES: conjunctiva are pink and non-injected, sclera anicteric OROPHARYNX: MMM, no exudates, no oropharyngeal erythema or ulceration NECK: supple, no JVD LYMPH:  no palpable lymphadenopathy in the cervical, axillary or inguinal regions LUNGS: clear to auscultation b/l with normal respiratory effort HEART: regular rate & rhythm ABDOMEN:  normoactive bowel sounds , non tender, not distended. No palpable hepatosplenomegaly.  Extremity: no pedal edema PSYCH: alert & oriented x 3 with fluent speech NEURO: no focal motor/sensory deficits  LABORATORY DATA:  I have reviewed the data as listed  . CBC Latest Ref Rng & Units 06/05/2018 05/30/2017 09/21/2016  WBC 4.0 - 10.5 K/uL 6.7 6.3 7.0  Hemoglobin 12.0 - 15.0 g/dL 16.2(H) 15.6(H) 15.5(H)  Hematocrit 36.0 - 46.0 % 47.8(H) 45.5 45.0  Platelets 150.0 - 400.0 K/uL 258.0 273.0 270.0    . CMP Latest Ref Rng & Units 06/05/2018 05/30/2017 09/21/2016  Glucose 70 - 99 mg/dL 102(H) 107(H) 113(H)  BUN 6 - 23 mg/dL 12 12 15   Creatinine 0.40 - 1.20 mg/dL 1.05 0.97 1.07  Sodium 135 - 145 mEq/L 140 141 139  Potassium 3.5 - 5.1 mEq/L 4.1 4.2 4.4  Chloride 96 - 112 mEq/L 103 103 104  CO2 19 - 32 mEq/L 28 30 30   Calcium 8.4 - 10.5 mg/dL 9.1 9.3 9.4  Total Protein 6.0 - 8.3 g/dL 6.4 6.4 6.5  Total Bilirubin 0.2 - 1.2 mg/dL 0.6 0.8 0.5  Alkaline Phos 39 - 117 U/L 78 95 116   AST 0 - 37 U/L 24 30 22   ALT 0 - 35 U/L 28 39(H) 27     RADIOGRAPHIC STUDIES: I have personally reviewed the radiological images as listed and agreed with the findings in the report. No results found.  ASSESSMENT & PLAN:  63 y.o. female with  1. Polycythemia plan -Discussed patient's most recent labs from 06/05/18, HGB at 16.2 and HCT at 47.8. Other blood counts normal. GFR slightly low at 53.31, other chemistries are normal. -Reviewed previous labs; earliest available HGB from 05/26/15 was at 15.6 with a HCT of 46.3. 09/21/16 HGB at 15.5. 05/30/17 HGB at 15.6. -Discussed that the patient's polycythemia is borderline, and after looking at the trend over the last 3.5 years, she has not had progression nor other blood count abnormalities. Do not suspect primary bone marrow disorder such as polycythemia vera. -Discussed that her mild polycythemia is likely secondary from hemo-concentration from diuretics vs lower oxygen levels due to asthma vs lower oxygen levels due to sleep apnea vs fluctuating thyroid levels -Recommend pursuing sleep study with PCP to rule out sleep apnea, which could be a cause of mild polycythemia, and pt does endorse snoring -Will order labs today -Will see the pt back based on labs   Labs today RTC with Dr Irene Limbo based on labs   All of the patients questions were answered with apparent satisfaction. The patient knows to call the clinic with any problems, questions or concerns.  The total time spent in the appt was 30 minutes and more than 50% was on counseling and direct patient cares.    Sullivan Lone MD [place] AAHIVMS [facility] Hematology/Oncology Physician [facility]  (Office):       [phone] (Work cell):  [phone] (Fax):           [phone]  09/26/2018 11:38  AM  I, Baldwin [place], am acting as a scribe for Dr. Sullivan Lone.   .I have reviewed the above documentation for accuracy and completeness, and I agree with the above. Brunetta Genera MD   . CBC Latest Ref Rng & Units 09/26/2018 06/05/2018 05/30/2017  WBC 4.0 - 10.5 K/uL 7.4 6.7 6.3  Hemoglobin 12.0 - 15.0 g/dL 15.3(H) 16.2(H) 15.6(H)  Hematocrit 36.0 - 46.0 % 44.8 47.8(H) 45.5  Platelets 150 - 400 K/uL 233 258.0 273.0   Jak2 , CLAR and MPL neg.  Polycythemia Vera ruled out. Polycythemia resolved -- likely from hemoconcentration that has corrected.  PLAN -continue f/u with PCP - no additional hematologic workup indicated  .Brunetta Genera MD

## 2018-10-04 ENCOUNTER — Other Ambulatory Visit: Payer: Self-pay | Admitting: Family Medicine

## 2018-10-04 ENCOUNTER — Telehealth: Payer: Self-pay | Admitting: Internal Medicine

## 2018-10-04 DIAGNOSIS — Z1231 Encounter for screening mammogram for malignant neoplasm of breast: Secondary | ICD-10-CM

## 2018-10-04 DIAGNOSIS — Z122 Encounter for screening for malignant neoplasm of respiratory organs: Secondary | ICD-10-CM

## 2018-10-04 DIAGNOSIS — Z87891 Personal history of nicotine dependence: Secondary | ICD-10-CM

## 2018-10-04 NOTE — Telephone Encounter (Signed)
Spoke to pt and scheduled SDMV 10/24/18 10:30 CT ordered Nothing further needed

## 2018-10-08 ENCOUNTER — Telehealth: Payer: Self-pay | Admitting: *Deleted

## 2018-10-08 ENCOUNTER — Encounter: Payer: Self-pay | Admitting: Family Medicine

## 2018-10-08 NOTE — Telephone Encounter (Signed)
-----   Message from Brunetta Genera, MD sent at 10/04/2018 12:56 AM EDT ----- Plz let patient know her labs showed resolution of her polycythemia. Molecular testing neg. No indication of a primary bone marrow problem. Recommendations sent to her PCP.

## 2018-10-08 NOTE — Telephone Encounter (Signed)
Attempted to contact patient regarding test results per Dr. Grier Mitts directions in message below.  LVM for patient to contact office at [phone] for results and Dr. Grier Mitts recommendation.

## 2018-10-09 ENCOUNTER — Telehealth: Payer: Self-pay | Admitting: *Deleted

## 2018-10-09 LAB — JAK2 (INCLUDING V617F AND EXON 12), MPL,& CALR-NEXT GEN SEQ

## 2018-10-09 NOTE — Telephone Encounter (Signed)
Patient called office back. Results stated in previous messages given to patient. Patient verbalized understanding.

## 2018-10-09 NOTE — Telephone Encounter (Signed)
Attempted to contact patient regarding test results per Dr. Grier Mitts directions in message below.  LVM for patient to contact office at [phone] for results and Dr. Grier Mitts recommendation. "Plz let patient know her labs showed resolution of her polycythemia. Molecular testing neg. No indication of a primary bone marrow problem. Recommendations sent to her PCP."

## 2018-10-09 NOTE — Telephone Encounter (Signed)
-----   Message from Rolland Bimler, RN sent at 10/09/2018  9:22 AM EDT ----- 10/09/2018 9:22 LVM for patient - note in chart - to contact office for results ----- Message ----- From: Rolland Bimler, RN Sent: 10/08/2018  10:47 AM EDT To: Rolland Bimler, RN  10/08/2018 10:40 LVM for patient - note in chart - to contact office for results.  ----- Message ----- From: Brunetta Genera, MD Sent: 10/04/2018  12:56 AM EDT To: Rolland Bimler, RN  Plz let patient know her labs showed resolution of her polycythemia. Molecular testing neg. No indication of a primary bone marrow problem. Recommendations sent to her PCP.

## 2018-10-15 ENCOUNTER — Encounter: Payer: Self-pay | Admitting: Family Medicine

## 2018-10-24 ENCOUNTER — Ambulatory Visit
Admission: RE | Admit: 2018-10-24 | Discharge: 2018-10-24 | Disposition: A | Discharge status: Home / Self Care | Payer: 59 | Source: Ambulatory Visit | Attending: Acute Care | Admitting: Acute Care

## 2018-10-24 ENCOUNTER — Other Ambulatory Visit: Payer: Self-pay

## 2018-10-24 ENCOUNTER — Ambulatory Visit (INDEPENDENT_AMBULATORY_CARE_PROVIDER_SITE_OTHER): Payer: 59 | Admitting: Acute Care

## 2018-10-24 ENCOUNTER — Encounter: Payer: Self-pay | Admitting: Acute Care

## 2018-10-24 VITALS — BP 120/80 | HR 81 | Ht 62.5 in | Wt 211.0 lb

## 2018-10-24 DIAGNOSIS — Z87891 Personal history of nicotine dependence: Secondary | ICD-10-CM

## 2018-10-24 DIAGNOSIS — Z122 Encounter for screening for malignant neoplasm of respiratory organs: Secondary | ICD-10-CM

## 2018-10-24 NOTE — Progress Notes (Addendum)
Shared Decision Making Visit Lung Cancer Screening Program [phone])   Eligibility:  Age 63 y.o.  Pack Years Smoking History Calculation 32 pack year smoking history (# packs/per year x # years smoked)  Recent History of coughing up blood  no  Unexplained weight loss? no ( >Than 15 pounds within the last 6 months )  Prior History Lung / other cancer no (Diagnosis within the last 5 years already requiring surveillance chest CT Scans).  Smoking Status Former Smoker  Former Smokers: Years since quit: 14 years  Quit Date: 2006  Visit Components:  Discussion included one or more decision making aids. yes  Discussion included risk/benefits of screening. yes  Discussion included potential follow up diagnostic testing for abnormal scans. yes  Discussion included meaning and risk of over diagnosis. yes  Discussion included meaning and risk of False Positives. yes  Discussion included meaning of total radiation exposure. yes  Counseling Included:  Importance of adherence to annual lung cancer LDCT screening. yes  Impact of comorbidities on ability to participate in the program. yes  Ability and willingness to under diagnostic treatment. yes  Smoking Cessation Counseling:  Current Smokers:   Discussed importance of smoking cessation. NA, former smoker  Information about tobacco cessation classes and interventions provided to patient. yes  Patient provided with "ticket" for LDCT Scan. yes  Symptomatic Patient. no  Counseling NA  Diagnosis Code: Tobacco Use Z72.0  Asymptomatic Patient yes  Counseling (Intermediate counseling: > three minutes counseling) D6222  Former Smokers:   Discussed the importance of maintaining cigarette abstinence. yes  Diagnosis Code: Personal History of Nicotine Dependence. L79.892  Information about tobacco cessation classes and interventions provided to patient. Yes  Patient provided with "ticket" for LDCT Scan. yes  Written Order  for Lung Cancer Screening with LDCT placed in Epic. Yes (CT Chest Lung Cancer Screening Low Dose W/O CM) [id number] Z12.2-Screening of respiratory organs Z87.891-Personal history of nicotine dependence  BP 120/80 (BP Location: Left Arm, Cuff Size: Normal)   Pulse 81   Ht 5' 2.5" (1.588 m)   Wt 211 lb (95.7 kg)   SpO2 95%   BMI 37.98 kg/m     I spent 25 minutes of face to face time with Ms. Handler discussing the risks and benefits of lung cancer screening. We viewed a power point together that explained in detail the above noted topics. We took the time to pause the power point at intervals to allow for questions to be asked and answered to ensure understanding. We discussed that she had taken the single most powerful action possible to decrease her risk of developing lung cancer when she quit smoking. I counseled her to remain smoke free, and to contact me if she ever had the desire to smoke again so that I can provide resources and tools to help support the effort to remain smoke free. We discussed the time and location of the scan, and that either  Doroteo Glassman RN or I will call with the results within  24-48 hours of receiving them. She has my card and contact information in the event she needs to speak with me, in addition to a copy of the power point we reviewed as a resource. She verbalized understanding of all of the above and had no further questions upon leaving the office.     I explained to the patient that there has been a high incidence of coronary artery disease noted on these exams. I explained that this is a non-gated exam  therefore degree or severity cannot be determined. This patient is currently on statin therapy. I have asked the patient to follow-up with their PCP regarding any incidental finding of coronary artery disease and management with diet or medication as they feel is clinically indicated. The patient verbalized understanding of the above and had no further  questions.     Magdalen Spatz, NP 10/24/2018 10:52 AM

## 2018-10-25 MED FILL — ZOLPIDEM TARTRATE 5 MG TAB: 5 | 90 days supply | Qty: 90 | Fill #1

## 2018-10-29 MED FILL — SYNTHROID 125 MCG TABLET: 125 | 90 days supply | Qty: 90 | Fill #1

## 2018-10-31 MED FILL — FUROSEMIDE 20 MG TABS: 20 | 30 days supply | Qty: 30 | Fill #1

## 2018-10-31 MED FILL — POTASSIUM CHLORIDE CRYS ER: 20 | 30 days supply | Qty: 30 | Fill #0

## 2018-11-01 DIAGNOSIS — K219 Gastro-esophageal reflux disease without esophagitis: Secondary | ICD-10-CM | POA: Diagnosis not present

## 2018-11-01 DIAGNOSIS — Z1211 Encounter for screening for malignant neoplasm of colon: Secondary | ICD-10-CM | POA: Diagnosis not present

## 2018-11-01 MED FILL — PEG-3350 AND ELECTROLYTES S: 236 | 2 days supply | Qty: 4000 | Fill #0

## 2018-11-02 ENCOUNTER — Other Ambulatory Visit: Payer: Self-pay | Admitting: *Deleted

## 2018-11-02 DIAGNOSIS — Z87891 Personal history of nicotine dependence: Secondary | ICD-10-CM

## 2018-11-02 DIAGNOSIS — Z122 Encounter for screening for malignant neoplasm of respiratory organs: Secondary | ICD-10-CM

## 2018-11-06 ENCOUNTER — Encounter: Payer: Self-pay | Admitting: Family Medicine

## 2018-11-12 ENCOUNTER — Encounter: Payer: Self-pay | Admitting: Family Medicine

## 2018-11-12 ENCOUNTER — Ambulatory Visit (INDEPENDENT_AMBULATORY_CARE_PROVIDER_SITE_OTHER): Payer: 59 | Admitting: Family Medicine

## 2018-11-12 DIAGNOSIS — F4323 Adjustment disorder with mixed anxiety and depressed mood: Secondary | ICD-10-CM | POA: Diagnosis not present

## 2018-11-12 DIAGNOSIS — G44219 Episodic tension-type headache, not intractable: Secondary | ICD-10-CM | POA: Diagnosis not present

## 2018-11-12 DIAGNOSIS — D751 Secondary polycythemia: Secondary | ICD-10-CM

## 2018-11-12 DIAGNOSIS — Z87891 Personal history of nicotine dependence: Secondary | ICD-10-CM | POA: Insufficient documentation

## 2018-11-12 MED ORDER — SERTRALINE HCL 50 MG PO TABS: 50.0000 mg | ORAL_TABLET | Freq: Every day | ORAL | 3 refills | Status: DC

## 2018-11-12 MED ORDER — BACLOFEN 20 MG PO TABS: 20.0000 mg | ORAL_TABLET | Freq: Three times a day (TID) | ORAL | 0 refills | Status: DC

## 2018-11-12 MED FILL — SERTRALINE HCL 50 MG TABLET: 50 | 90 days supply | Qty: 90 | Fill #0

## 2018-11-12 MED FILL — BACLOFEN 20 MG TABLET: 20 | 10 days supply | Qty: 30 | Fill #0

## 2018-11-12 NOTE — Progress Notes (Signed)
Virtual Visit via Video   Due to the COVID-19 pandemic, this visit was completed with telemedicine (audio/video) technology to reduce patient and provider exposure as well as to preserve personal protective equipment.   I connected with Stanton Kidney A [facility] by a video enabled telemedicine application and verified that I am speaking with the correct person using two identifiers. Location patient: Home Location provider: [facility], Office Persons participating in the virtual visit: Aldine Contes, DO   I discussed the limitations of evaluation and management by telemedicine and the availability of in person appointments. The patient expressed understanding and agreed to proceed.  Care Team   Patient Care Team: Briscoe Deutscher, DO as PCP - General (Family Medicine) Lyndal Pulley, DO as Consulting Physician (Family Medicine) Philemon Kingdom, MD as Consulting Physician (Internal Medicine) Mosetta Anis, MD as Referring Physician (Allergy) Servando Salina, MD as Consulting Physician (Obstetrics and Gynecology)  Subjective:   HPI:   1. Polycythemia Mild. Reviewed visit with Dr. Irene Limbo.  2. Former smoker, 32 pack year history, quit 2006 Ct Chest Lung Ca Screen Low Dose W/o Cm  Result Date: 10/24/2018 CLINICAL DATA:  63 year old female with 30 pack-year history of smoking. Lung cancer screening. EXAM: CT CHEST WITHOUT CONTRAST LOW-DOSE FOR LUNG CANCER SCREENING TECHNIQUE: Multidetector CT imaging of the chest was performed following the standard protocol without IV contrast. COMPARISON:  None. FINDINGS: Cardiovascular: The heart size is normal. No substantial pericardial effusion. Atherosclerotic calcification is noted in the wall of the thoracic aorta. Mediastinum/Nodes: No mediastinal lymphadenopathy. No evidence for gross hilar lymphadenopathy although assessment is limited by the lack of intravenous contrast on today's study. The esophagus has normal imaging features. There  is no axillary lymphadenopathy. Lungs/Pleura: Scattered tiny bilateral pulmonary nodules are identified measuring up to maximum volume derived equivalent diameter of 2.7 mm. No overtly suspicious nodule or mass. No focal airspace consolidation. No pleural effusion. Upper Abdomen: Unremarkable. Musculoskeletal: No worrisome lytic or sclerotic osseous abnormality. IMPRESSION: 1. Lung-RADS 2, benign appearance or behavior. Continue annual screening with low-dose chest CT without contrast in 12 months. 2.  Aortic Atherosclerois (ICD10-170.0) Electronically Signed   By: Misty Stanley M.D.   On: 10/24/2018 13:47   3. 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 Wearing full PPE all day. Isolating. Worried about exposure with her asthma. No SI,  4. Episodic tension-type headache, not intractable Started with wearing PPE and increased anxiety. Followed by Dr. Tamala Julian, okay for OMT.   Review of Systems  Constitutional: Negative for chills, fever, malaise/fatigue and weight loss.  Respiratory: Negative for cough, shortness of breath and wheezing.   Cardiovascular: Negative for chest pain, palpitations and leg swelling.  Gastrointestinal: Negative for abdominal pain, constipation, diarrhea, nausea and vomiting.  Genitourinary: Negative for dysuria and urgency.  Musculoskeletal: Positive for myalgias and neck pain. Negative for joint pain.  Skin: Negative for rash.  Neurological: Positive for headaches. Negative for dizziness.  Psychiatric/Behavioral: Negative for substance abuse and suicidal ideas. The patient is nervous/anxious.     Patient Active Problem List   Diagnosis Date Noted  . Former smoker, 32 pack year history, quit 2006 11/12/2018  . Situational mixed anxiety and depressive disorder 11/12/2018  . Episodic tension-type headache, not intractable 11/12/2018  . Pure hypercholesterolemia 09/05/2018  . Polycythemia, mild,  evaluation by Dr Irene Limbo 2020 09/05/2018  . Bilateral lower extremity edema 04/11/2018  . Blood pressure elevated without history of HTN 04/11/2018  .  Mild cardiomegaly 05/30/2017  . Mild intermittent asthma without complication 41/66/0630  . Metatarsalgia 05/13/2015  . Muscle strain of right gluteal region 11/19/2014  . Sacroiliac joint dysfunction of right side 08/11/2014  . Nonallopathic lesion of sacral region 08/11/2014  . Nonallopathic lesion of thoracic region 08/11/2014  . Nonallopathic lesion of lumbosacral region 08/11/2014  . Postmenopausal disorder 02/14/2013  . Sleep initiation dysfunction 09/27/2011  . Hypothyroidism (acquired) 11/06/2006  . Allergic rhinitis 11/06/2006  . GERD 11/06/2006    Social History   Tobacco Use  . Smoking status: Former Smoker    Packs/day: 1.00    Years: 32.00    Pack years: 32.00    Types: Cigarettes    Quit date: 2006    Years since quitting: 14.5  . Smokeless tobacco: Never Used  Substance Use Topics  . Alcohol use: Yes    Alcohol/week: 14.0 standard drinks    Types: 14 Glasses of wine per week    Comment: 2 glasses per day    Current Outpatient Medications:  .  albuterol (PROVENTIL HFA) 108 (90 Base) MCG/ACT inhaler, Inhale 2 puffs into the lungs every 6 (six) hours as needed., Disp: 3 Inhaler, Rfl: 1 .  baclofen (LIORESAL) 20 MG tablet, Take 1 tablet (20 mg total) by mouth 3 (three) times daily., Disp: 30 each, Rfl: 0 .  budesonide-formoterol (SYMBICORT) 160-4.5 MCG/ACT inhaler, INHALE 2 PUFFS BY MOUTH 2 TIMES DAILY., Disp: 3 Inhaler, Rfl: 4 .  furosemide (LASIX) 20 MG tablet, Take 1 tablet (20 mg total) by mouth daily., Disp: 30 tablet, Rfl: 3 .  montelukast (SINGULAIR) 10 MG tablet, Take 1 tablet (10 mg total) by mouth at bedtime., Disp: 90 tablet, Rfl: 3 .  Multiple Vitamin (MULTIVITAMIN) tablet, Take 1 tablet by mouth daily., Disp: , Rfl:  .  potassium chloride (K-DUR) 10 MEQ tablet, Take 1 tablet by mouth 2 (two) times a day.,  Disp: , Rfl:  .  rosuvastatin (CRESTOR) 5 MG tablet, Take 1 tablet (5 mg total) by mouth daily., Disp: 90 tablet, Rfl: 3 .  sertraline (ZOLOFT) 50 MG tablet, Take 1 tablet (50 mg total) by mouth at bedtime., Disp: 90 tablet, Rfl: 3 .  SYNTHROID 125 MCG tablet, TAKE 1 TABLET BY MOUTH DAILY BEFORE BREAKFAST., Disp: 45 tablet, Rfl: 5 .  zolpidem (AMBIEN) 5 MG tablet, Take 1 tablet (5 mg total) by mouth at bedtime as needed., Disp: 90 tablet, Rfl: 1  Allergies  Allergen Reactions  . Bactrim   . Penicillins     Objective:   VITALS: Per patient if applicable, see vitals. GENERAL: Alert, appears well and in no acute distress. HEENT: Atraumatic, conjunctiva clear, no obvious abnormalities on inspection of external nose and ears. NECK: Normal movements of the head and neck. CARDIOPULMONARY: No increased WOB. Speaking in clear sentences. I:E ratio WNL.  MS: Moves all visible extremities without noticeable abnormality. PSYCH: Pleasant and cooperative, well-groomed. Speech normal rate and rhythm. Affect is appropriate. Insight and judgement are appropriate. Attention is focused, linear, and appropriate.  NEURO: CN grossly intact. Oriented as arrived to appointment on time with no prompting. Moves both UE equally.  SKIN: No obvious lesions, wounds, erythema, or cyanosis noted on face or hands.  Depression screen PHQ 2/9 06/05/2018  Decreased Interest 0  Down, Depressed, Hopeless 0  PHQ - 2 Score 0    Assessment and Plan:   Diagnoses and all orders for this visit:  Situational mixed anxiety and depressive disorder -     sertraline (  ZOLOFT) 50 MG tablet; Take 1 tablet (50 mg total) by mouth at bedtime.  Polycythemia  Former smoker, 32 pack year history, quit 2006  Episodic tension-type headache, not intractable -     baclofen (LIORESAL) 20 MG tablet; Take 1 tablet (20 mg total) by mouth 3 (three) times daily.   Marland Kitchen COVID-19 Education: The signs and symptoms of COVID-19 were discussed with  the patient and how to seek care for testing if needed. The importance of social distancing was discussed today. . Reviewed expectations re: course of current medical issues. . Discussed self-management of symptoms. . Outlined signs and symptoms indicating need for more acute intervention. . Patient verbalized understanding and all questions were answered. Marland Kitchen Health Maintenance issues including appropriate healthy diet, exercise, and smoking avoidance were discussed with patient. . See orders for this visit as documented in the electronic medical record.  Briscoe Deutscher, DO  Records requested if needed. Time spent: 25 minutes, of which >50% was spent in obtaining information about her symptoms, reviewing her previous labs, evaluations, and treatments, counseling her about her condition (please see the discussed topics above), and developing a plan to further investigate it; she had a number of questions which I addressed.

## 2018-11-16 DIAGNOSIS — D120 Benign neoplasm of cecum: Secondary | ICD-10-CM | POA: Diagnosis not present

## 2018-11-16 DIAGNOSIS — K635 Polyp of colon: Secondary | ICD-10-CM | POA: Diagnosis not present

## 2018-11-16 DIAGNOSIS — Z1211 Encounter for screening for malignant neoplasm of colon: Secondary | ICD-10-CM | POA: Diagnosis not present

## 2018-11-16 DIAGNOSIS — K621 Rectal polyp: Secondary | ICD-10-CM | POA: Diagnosis not present

## 2018-11-16 DIAGNOSIS — D122 Benign neoplasm of ascending colon: Secondary | ICD-10-CM | POA: Diagnosis not present

## 2018-11-16 DIAGNOSIS — D123 Benign neoplasm of transverse colon: Secondary | ICD-10-CM | POA: Diagnosis not present

## 2018-11-16 LAB — HM COLONOSCOPY

## 2018-11-21 ENCOUNTER — Ambulatory Visit
Admission: RE | Admit: 2018-11-21 | Discharge: 2018-11-21 | Disposition: A | Discharge status: Home / Self Care | Payer: 59 | Source: Ambulatory Visit | Attending: Family Medicine | Admitting: Family Medicine

## 2018-11-21 ENCOUNTER — Other Ambulatory Visit: Payer: Self-pay

## 2018-11-21 DIAGNOSIS — Z1231 Encounter for screening mammogram for malignant neoplasm of breast: Secondary | ICD-10-CM

## 2018-11-27 MED FILL — SYMBICORT 160-4.5 MCG INH: 160-4.5 | 90 days supply | Qty: 31 | Fill #1

## 2018-12-03 MED FILL — ROSUVASTATIN CALCIUM 5 MG T: 5 | 90 days supply | Qty: 90 | Fill #1

## 2018-12-03 MED FILL — MONTELUKAST SOD 10 MG TAB: 10 | 90 days supply | Qty: 90 | Fill #1

## 2018-12-05 ENCOUNTER — Ambulatory Visit: Payer: 59 | Admitting: Family Medicine

## 2018-12-09 ENCOUNTER — Other Ambulatory Visit: Payer: Self-pay

## 2018-12-19 ENCOUNTER — Other Ambulatory Visit: Payer: Self-pay | Admitting: Family Medicine

## 2018-12-19 MED FILL — FUROSEMIDE 20 MG TABS: 20 | 30 days supply | Qty: 30 | Fill #0

## 2019-01-03 ENCOUNTER — Encounter: Payer: Self-pay | Admitting: Family Medicine

## 2019-01-18 DIAGNOSIS — H52203 Unspecified astigmatism, bilateral: Secondary | ICD-10-CM | POA: Diagnosis not present

## 2019-01-19 DIAGNOSIS — L7211 Pilar cyst: Secondary | ICD-10-CM | POA: Diagnosis not present

## 2019-01-19 DIAGNOSIS — L400 Psoriasis vulgaris: Secondary | ICD-10-CM | POA: Diagnosis not present

## 2019-01-19 DIAGNOSIS — L299 Pruritus, unspecified: Secondary | ICD-10-CM | POA: Diagnosis not present

## 2019-01-23 ENCOUNTER — Other Ambulatory Visit: Payer: Self-pay | Admitting: Family Medicine

## 2019-01-23 DIAGNOSIS — G4700 Insomnia, unspecified: Secondary | ICD-10-CM

## 2019-01-23 DIAGNOSIS — Z0000 Encounter for general adult medical examination without abnormal findings: Secondary | ICD-10-CM

## 2019-01-23 DIAGNOSIS — E038 Other specified hypothyroidism: Secondary | ICD-10-CM

## 2019-01-24 ENCOUNTER — Encounter: Payer: Self-pay | Admitting: Family Medicine

## 2019-01-25 MED FILL — ZOLPIDEM TARTRATE 5 MG TAB: 5 | 90 days supply | Qty: 90 | Fill #0

## 2019-01-25 NOTE — Telephone Encounter (Signed)
Last fill 07/16/18  #90/1 Last OV 11/12/18 Next OV 02/06/19

## 2019-02-01 MED FILL — CLOBETASOL 0.05% SOLUTION: 0.05 | 30 days supply | Qty: 100 | Fill #0

## 2019-02-06 ENCOUNTER — Encounter: Payer: Self-pay | Admitting: Family Medicine

## 2019-02-06 ENCOUNTER — Ambulatory Visit (INDEPENDENT_AMBULATORY_CARE_PROVIDER_SITE_OTHER): Payer: 59 | Admitting: Family Medicine

## 2019-02-06 ENCOUNTER — Other Ambulatory Visit: Payer: Self-pay

## 2019-02-06 VITALS — BP 122/74 | HR 60 | Temp 98.6°F | Ht 62.5 in | Wt 210.0 lb

## 2019-02-06 DIAGNOSIS — R7989 Other specified abnormal findings of blood chemistry: Secondary | ICD-10-CM | POA: Diagnosis not present

## 2019-02-06 DIAGNOSIS — E7800 Pure hypercholesterolemia, unspecified: Secondary | ICD-10-CM

## 2019-02-06 DIAGNOSIS — J4521 Mild intermittent asthma with (acute) exacerbation: Secondary | ICD-10-CM | POA: Diagnosis not present

## 2019-02-06 DIAGNOSIS — R600 Localized edema: Secondary | ICD-10-CM | POA: Diagnosis not present

## 2019-02-06 DIAGNOSIS — G44219 Episodic tension-type headache, not intractable: Secondary | ICD-10-CM | POA: Diagnosis not present

## 2019-02-06 DIAGNOSIS — E039 Hypothyroidism, unspecified: Secondary | ICD-10-CM

## 2019-02-06 DIAGNOSIS — J301 Allergic rhinitis due to pollen: Secondary | ICD-10-CM | POA: Diagnosis not present

## 2019-02-06 MED ORDER — ROSUVASTATIN CALCIUM 5 MG PO TABS: 5.0000 mg | ORAL_TABLET | Freq: Every day | ORAL | 3 refills | Status: DC

## 2019-02-06 MED ORDER — POTASSIUM CHLORIDE CRYS ER 10 MEQ PO TBCR: 10.0000 meq | EXTENDED_RELEASE_TABLET | Freq: Every day | ORAL | 2 refills | Status: DC

## 2019-02-06 MED ORDER — BUDESONIDE-FORMOTEROL FUMARATE 160-4.5 MCG/ACT IN AERO: INHALATION_SPRAY | RESPIRATORY_TRACT | 4 refills | Status: DC

## 2019-02-06 MED ORDER — ALBUTEROL SULFATE HFA 108 (90 BASE) MCG/ACT IN AERS: 2.0000 | INHALATION_SPRAY | Freq: Four times a day (QID) | RESPIRATORY_TRACT | 1 refills | Status: DC | PRN

## 2019-02-06 MED ORDER — FUROSEMIDE 20 MG PO TABS: 20.0000 mg | ORAL_TABLET | Freq: Every day | ORAL | 1 refills | Status: DC

## 2019-02-06 MED ORDER — BACLOFEN 20 MG PO TABS: 20.0000 mg | ORAL_TABLET | Freq: Three times a day (TID) | ORAL | 0 refills | Status: DC

## 2019-02-06 MED FILL — FUROSEMIDE 20 MG TABS: 20 | 30 days supply | Qty: 30 | Fill #0

## 2019-02-06 MED FILL — BACLOFEN 20 MG TABLET: 20 | 10 days supply | Qty: 30 | Fill #0

## 2019-02-06 MED FILL — ALBUTEROL SULFATE HFA 108 (: 108 (90 BAS | 25 days supply | Qty: 18 | Fill #0

## 2019-02-06 MED FILL — POTASSIUM CHL ER M10 TABLET: 10 | 30 days supply | Qty: 30 | Fill #0

## 2019-02-06 NOTE — Progress Notes (Signed)
Susan Burch is a 63 y.o. female is here for follow up.  History of Present Illness:   Susan Burch, CMA acting as scribe for Dr. Briscoe Deutscher.   HPI:   Situational mixed anxiety and depressive disorder Patient was on Zoloft 50mg . She stopped after about a week. Current symptoms: none. No current suicidal and homicidal ideation. Side effects from treatment: none. She had some life changes that helped with situational anxiety.   Episodic tension-type headache, not intractable Started on Baclofen 20mg  one tablet three times a day. Patient has been taking as needed. It does help when she takes. She would like refill today.   There are no preventive care reminders to display for this patient. Depression screen PHQ 2/9 06/05/2018  Decreased Interest 0  Down, Depressed, Hopeless 0  PHQ - 2 Score 0   PMHx, SurgHx, SocialHx, FamHx, Medications, and Allergies were reviewed in the Visit Navigator and updated as appropriate.   Patient Active Problem List   Diagnosis Date Noted  . Former smoker, 32 pack year history, quit 2006 11/12/2018  . Situational mixed anxiety and depressive disorder 11/12/2018  . Episodic tension-type headache, not intractable 11/12/2018  . Pure hypercholesterolemia 09/05/2018  . Polycythemia, mild, evaluation by Dr Irene Limbo 2020 09/05/2018  . Bilateral lower extremity edema 04/11/2018  . Blood pressure elevated without history of HTN 04/11/2018  . Mild cardiomegaly 05/30/2017  . Mild intermittent asthma without complication [id number]  . Metatarsalgia 05/13/2015  . Muscle strain of right gluteal region 11/19/2014  . Sacroiliac joint dysfunction of right side 08/11/2014  . Nonallopathic lesion of sacral region 08/11/2014  . Nonallopathic lesion of thoracic region 08/11/2014  . Nonallopathic lesion of lumbosacral region 08/11/2014  . Postmenopausal disorder 02/14/2013  . Sleep initiation dysfunction 09/27/2011  . Hypothyroidism (acquired) 11/06/2006  .  Allergic rhinitis 11/06/2006  . GERD 11/06/2006   Social History   Tobacco Use  . Smoking status: Former Smoker    Packs/day: 1.00    Years: 32.00    Pack years: 32.00    Types: Cigarettes    Quit date: 2006    Years since quitting: 14.7  . Smokeless tobacco: Never Used  Substance Use Topics  . Alcohol use: Yes    Alcohol/week: 14.0 standard drinks    Types: 14 Glasses of wine per week    Comment: 2 glasses per day  . Drug use: Never   Current Medications and Allergies   .  albuterol (PROVENTIL HFA) 108 (90 Base) MCG/ACT inhaler, Inhale 2 puffs into the lungs every 6 (six) hours as needed., Disp: 18 g, Rfl: 1 .  baclofen (LIORESAL) 20 MG tablet, Take 1 tablet (20 mg total) by mouth 3 (three) times daily., Disp: 30 each, Rfl: 0 .  budesonide-formoterol (SYMBICORT) 160-4.5 MCG/ACT inhaler, INHALE 2 PUFFS BY MOUTH 2 TIMES DAILY., Disp: 3 Inhaler, Rfl: 4 .  clobetasol (TEMOVATE) 0.05 % external solution, , Disp: , Rfl:  .  furosemide (LASIX) 20 MG tablet, Take 1 tablet (20 mg total) by mouth daily., Disp: 30 tablet, Rfl: 1 .  montelukast (SINGULAIR) 10 MG tablet, Take 1 tablet (10 mg total) by mouth at bedtime., Disp: 90 tablet, Rfl: 3 .  Multiple Vitamin (MULTIVITAMIN) tablet, Take 1 tablet by mouth daily., Disp: , Rfl:  .  potassium chloride (KLOR-CON) 10 MEQ tablet, Take 1 tablet (10 mEq total) by mouth daily., Disp: 30 tablet, Rfl: 2 .  rosuvastatin (CRESTOR) 5 MG tablet, Take 1 tablet (5 mg total) by mouth  daily., Disp: 90 tablet, Rfl: 3 .  SYNTHROID 125 MCG tablet, TAKE 1 TABLET BY MOUTH DAILY BEFORE BREAKFAST., Disp: 45 tablet, Rfl: 5 .  zolpidem (AMBIEN) 5 MG tablet, TAKE 1 TABLET (5 MG TOTAL) BY MOUTH AT BEDTIME AS NEEDED., Disp: 90 tablet, Rfl: 1   Allergies  Allergen Reactions  . Bactrim   . Penicillins    Review of Systems   Pertinent items are noted in the HPI. Otherwise, a complete ROS is negative.  Vitals   Vitals:   02/06/19 0955  BP: 122/74  Pulse: 60    Temp: 98.6 F (37 C)  TempSrc: Temporal  SpO2: 96%  Weight: 210 lb (95.3 kg)  Height: 5' 2.5" (1.588 m)     Body mass index is 37.8 kg/m.  Physical Exam   Physical Exam Vitals signs and nursing note reviewed.  HENT:     Head: Normocephalic and atraumatic.  Eyes:     Pupils: Pupils are equal, round, and reactive to light.  Neck:     Musculoskeletal: Normal range of motion and neck supple.  Cardiovascular:     Rate and Rhythm: Normal rate and regular rhythm.     Heart sounds: Normal heart sounds.  Pulmonary:     Effort: Pulmonary effort is normal.  Abdominal:     Palpations: Abdomen is soft.  Skin:    General: Skin is warm.  Psychiatric:        Behavior: Behavior normal.    Assessment and Plan   Susan Burch was seen today for follow-up.  Diagnoses and all orders for this visit:  Hypothyroidism (acquired) -     TSH; Future -     T4, free; Future  Episodic tension-type headache, not intractable -     baclofen (LIORESAL) 20 MG tablet; Take 1 tablet (20 mg total) by mouth 3 (three) times daily.  Mild intermittent asthma with acute exacerbation -     albuterol (PROVENTIL HFA) 108 (90 Base) MCG/ACT inhaler; Inhale 2 puffs into the lungs every 6 (six) hours as needed. -     budesonide-formoterol (SYMBICORT) 160-4.5 MCG/ACT inhaler; INHALE 2 PUFFS BY MOUTH 2 TIMES DAILY.  Non-seasonal allergic rhinitis due to pollen -     albuterol (PROVENTIL HFA) 108 (90 Base) MCG/ACT inhaler; Inhale 2 puffs into the lungs every 6 (six) hours as needed. -     budesonide-formoterol (SYMBICORT) 160-4.5 MCG/ACT inhaler; INHALE 2 PUFFS BY MOUTH 2 TIMES DAILY.  Bilateral lower extremity edema -     furosemide (LASIX) 20 MG tablet; Take 1 tablet (20 mg total) by mouth daily. -     potassium chloride (KLOR-CON) 10 MEQ tablet; Take 1 tablet (10 mEq total) by mouth daily.  Pure hypercholesterolemia -     rosuvastatin (CRESTOR) 5 MG tablet; Take 1 tablet (5 mg total) by mouth daily. -     Lipid  panel; Future  Elevated serum creatinine -     Comprehensive metabolic panel; Future -     Microalbumin / creatinine urine ratio; Future   . Orders and follow up as documented in [facility], reviewed diet, exercise and weight control, cardiovascular risk and specific lipid/LDL goals reviewed, reviewed medications and side effects in detail.  . Reviewed expectations re: course of current medical issues. . Outlined signs and symptoms indicating need for more acute intervention. . Patient verbalized understanding and all questions were answered. . Patient received an After Visit Summary.  CMA served as Education administrator during this visit. History, Physical, and Plan performed by  medical provider. The above documentation has been reviewed and is accurate and complete. Briscoe Deutscher, D.O.  Briscoe Deutscher, DO [place], [place] [facility] 02/06/2019

## 2019-02-07 MED FILL — SYNTHROID 125 MCG TABLET: 125 | 30 days supply | Qty: 30 | Fill #2

## 2019-02-13 ENCOUNTER — Ambulatory Visit: Payer: 59 | Admitting: Internal Medicine

## 2019-02-13 ENCOUNTER — Encounter: Payer: Self-pay | Admitting: Internal Medicine

## 2019-02-13 ENCOUNTER — Other Ambulatory Visit: Payer: Self-pay

## 2019-02-13 VITALS — BP 120/78 | HR 60 | Ht 63.19 in | Wt 210.0 lb

## 2019-02-13 DIAGNOSIS — E039 Hypothyroidism, unspecified: Secondary | ICD-10-CM

## 2019-02-13 LAB — T4, FREE: Free T4: 0.96 ng/dL (ref 0.60–1.60)

## 2019-02-13 LAB — TSH: TSH: 3.24 u[IU]/mL (ref 0.35–4.50)

## 2019-02-13 NOTE — Progress Notes (Signed)
Patient ID: Susan Burch, female   DOB: Jun 07, 1955, 63 y.o.   MRN: [id number]    HPI  Susan Burch is a 63 y.o.-year-old female, returning for follow-up for uncontrolled, acquired, hypothyroidism.  Last visit 1 year ago.  Since last visit, she was diagnosed with polycythemia and referred to hematology.  Pt. has been dx with hypothyroidism in 1997-1998 after the birth of her son.  She was started on Synthroid then.  When I first saw her, she was on 175 mcg daily, however, she was not taking the medication correctly.  We were able to decrease the dose after she started to take it correctly.  Pt is on Synthroid d.a.w. 125 mcg daily mcg daily, taken: - in am - fasting - at least 30 min from b'fast and also coffee + creamer - no Ca, Fe - + Prevacid OTC - almost daily - + MVI at night - not on Biotin, but was on Hair skin and nails in the past  Reviewed patient's TFTs: Lab Results  Component Value Date   TSH 1.05 06/05/2018   TSH 0.77 02/15/2018   TSH 1.08 11/15/2017   TSH 0.19 (L) 09/28/2017   TSH 0.04 (L) 08/15/2017   TSH 0.03 (L) 05/30/2017   TSH 0.55 09/21/2016   TSH 4.21 08/06/2015   TSH 7.65 (H) 07/01/2015   TSH 10.78 (H) 05/26/2015   FREET4 1.03 06/05/2018   FREET4 1.12 02/15/2018   FREET4 1.07 11/15/2017   FREET4 1.24 09/28/2017   FREET4 1.36 08/15/2017   FREET4 1.18 05/30/2017   T3FREE 3.5 05/30/2017   Pt denies: - feeling nodules in neck - hoarseness - choking - SOB with lying down But has occasional dysphagia with GERD. Sometimes she feels food stuck.  She has + FH of thyroid disorders in: M and M aunt and uncle. Also all her children - hypothyroidism, M aunt. No FH of thyroid cancer. No h/o radiation tx to head or neck.  No seaweed or kelp. No recent contrast studies. No herbal supplements. No Biotin use. + recent steroids use - psoriasis of scalp >> im steroid inj 01/19/2019.   Pt. also has a history of asthma with cough.  She gets prn steroids, but did not  have to take these recently.  She started Crestor since.  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  ROS: Constitutional: no weight gain/no weight loss, no fatigue, no subjective hyperthermia, no subjective hypothermia Eyes: no blurry vision, no xerophthalmia ENT: no sore throat, + see HPI Cardiovascular: no CP/no SOB/no palpitations/no leg swelling Respiratory: no cough/no SOB/no wheezing Gastrointestinal: no N/no V/no D/no C/no acid reflux Musculoskeletal: no muscle aches/no joint aches Skin: no rashes, no hair loss Neurological: no tremors/no numbness/no tingling/no dizziness  I reviewed pt's medications, allergies, PMH, social hx, family hx, and changes were documented in the history of present illness. Otherwise, unchanged from my initial visit note.  Past Medical History:  Diagnosis Date  . GERD without esophagitis   . HLD (hyperlipidemia)   . Hypothyroidism    Past Surgical History:  Procedure Laterality Date  . BREAST EXCISIONAL BIOPSY Right 1990  . [place]  . CHOLECYSTECTOMY  1998  . MYOMECTOMY  1993   Social History   Socioeconomic History  . Marital status: Married    Spouse name: Not on file  . Number of children: 2  .    .    Occupational History  . RN  . Smoking status: Former  Smoker    Packs/day: 0.50    Years: 4.00    Pack years: 2.00    Types: Cigarettes    Last attempt to quit: 07/12/2004    Years since quitting: 13.1  . Smokeless tobacco: Never Used  Substance and Sexual Activity  . Alcohol use: Yes    Alcohol/week: 8.4 oz    Types: 14 Glasses of red wine per week    Comment: 2 glasses per day  . Drug use: No   Current Outpatient Medications on File Prior to Visit  Medication Sig Dispense Refill  . albuterol (PROVENTIL HFA) 108 (90 Base) MCG/ACT inhaler Inhale 2 puffs into the lungs every 6 (six) hours as needed. 18 g 1  . baclofen (LIORESAL) 20 MG tablet Take 1 tablet (20 mg total) by mouth 3 (three) times daily. 30 each 0  .  budesonide-formoterol (SYMBICORT) 160-4.5 MCG/ACT inhaler INHALE 2 PUFFS BY MOUTH 2 TIMES DAILY. 3 Inhaler 4  . clobetasol (TEMOVATE) 0.05 % external solution     . furosemide (LASIX) 20 MG tablet Take 1 tablet (20 mg total) by mouth daily. 30 tablet 1  . montelukast (SINGULAIR) 10 MG tablet Take 1 tablet (10 mg total) by mouth at bedtime. 90 tablet 3  . Multiple Vitamin (MULTIVITAMIN) tablet Take 1 tablet by mouth daily.    . potassium chloride (KLOR-CON) 10 MEQ tablet Take 1 tablet (10 mEq total) by mouth daily. 30 tablet 2  . rosuvastatin (CRESTOR) 5 MG tablet Take 1 tablet (5 mg total) by mouth daily. 90 tablet 3  . SYNTHROID 125 MCG tablet TAKE 1 TABLET BY MOUTH DAILY BEFORE BREAKFAST. 45 tablet 5  . zolpidem (AMBIEN) 5 MG tablet TAKE 1 TABLET (5 MG TOTAL) BY MOUTH AT BEDTIME AS NEEDED. 90 tablet 1   No current facility-administered medications on file prior to visit.    Allergies  Allergen Reactions  . Bactrim   . Penicillins    FH:  Diabetes in brother, MGM  HTN in father, maternal uncle  HL in father  Heart disease in mother, maternal aunt, maternal uncle  Thyroid problems-see HPI  Cancer in maternal aunt and first cousin: Multiple myeloma, lymphoma, respectively  PE: BP 120/78   Pulse 60   Ht 5' 3.19" (1.605 m) Comment: measured today.  Wt 210 lb (95.3 kg)   SpO2 96%   BMI 36.98 kg/m  Wt Readings from Last 3 Encounters:  02/13/19 210 lb (95.3 kg)  02/06/19 210 lb (95.3 kg)  10/24/18 211 lb (95.7 kg)   Constitutional: overweight, in NAD Eyes: PERRLA, EOMI, no exophthalmos ENT: moist mucous membranes, no thyromegaly, no cervical lymphadenopathy Cardiovascular: RRR, No MRG Respiratory: CTA B Gastrointestinal: abdomen soft, NT, ND, BS+ Musculoskeletal: no deformities, strength intact in all 4 Skin: moist, warm, no rashes Neurological: no tremor with outstretched hands, DTR normal in all 4  ASSESSMENT: 1. Hypothyroidism  PLAN:  1. Patient with  longstanding acquired hypothyroidism, on levothyroxine therapy.  Her TSH levels have been fluctuating in the past, most likely due to incorrect dosing.  She was taking levothyroxine occasionally multivitamins and occasionally with coffee agreement.  We separated levothyroxine from all these and her TSH improved so we had to back off her levothyroxine dose.  She is currently on 125 mcg of levothyroxine daily, last dose change 09/2017.  - latest thyroid labs reviewed with pt >> normal 8 months ago: Lab Results  Component Value Date   TSH 1.05 06/05/2018  - pt feels good on this  dose, without complaints. - we discussed about taking the thyroid hormone every day, with water, >30 minutes before breakfast, separated by >4 hours from acid reflux medications, calcium, iron, multivitamins. Pt. is taking it correctly. - will check thyroid tests today: TSH and fT4 - If labs are abnormal, she will need to return for repeat TFTs in 1.5 months - I would see her back in a year  Needs refills for 3 mo.   - time spent with the patient: 15 minutes, of which >50% was spent in obtaining information about her symptoms, reviewing her previous labs, evaluations, and treatments, counseling her about her condition (please see the discussed topics above), and developing a plan to further investigate and treat it.  Office Visit on 02/13/2019  Component Date Value Ref Range Status  . TSH 02/13/2019 3.24  0.35 - 4.50 uIU/mL Final  . Free T4 02/13/2019 0.96  0.60 - 1.60 ng/dL Final   Comment: Specimens from patients who are undergoing biotin therapy and /or ingesting biotin supplements may contain high levels of biotin.  The higher biotin concentration in these specimens interferes with this Free T4 assay.  Specimens that contain high levels  of biotin may cause false high results for this Free T4 assay.  Please interpret results in light of the total clinical presentation of the patient.     Normal TFTs.  Philemon Kingdom, MD PhD [facility] Endocrinology

## 2019-02-13 NOTE — Patient Instructions (Signed)
Please continue Synthroid 125 mcg daily.  Take the thyroid hormone every day, with water, at least 30 minutes before breakfast, separated by at least 4 hours from: - acid reflux medications - calcium - iron - multivitamins  Please stop at the lab.  Please come back for a follow-up appointment in 1 year.  

## 2019-02-15 MED ORDER — SYNTHROID 125 MCG PO TABS: ORAL_TABLET | ORAL | 3 refills | Status: DC

## 2019-02-20 ENCOUNTER — Encounter: Payer: Self-pay | Admitting: Family Medicine

## 2019-02-20 ENCOUNTER — Ambulatory Visit: Payer: 59 | Admitting: Family Medicine

## 2019-02-20 ENCOUNTER — Other Ambulatory Visit: Payer: Self-pay

## 2019-02-20 ENCOUNTER — Other Ambulatory Visit: Payer: 59

## 2019-02-20 VITALS — BP 108/78 | HR 65 | Temp 98.0°F | Resp 16 | Ht 62.5 in | Wt 210.8 lb

## 2019-02-20 DIAGNOSIS — E039 Hypothyroidism, unspecified: Secondary | ICD-10-CM | POA: Diagnosis not present

## 2019-02-20 DIAGNOSIS — E782 Mixed hyperlipidemia: Secondary | ICD-10-CM | POA: Diagnosis not present

## 2019-02-20 DIAGNOSIS — G4700 Insomnia, unspecified: Secondary | ICD-10-CM | POA: Diagnosis not present

## 2019-02-20 DIAGNOSIS — J4530 Mild persistent asthma, uncomplicated: Secondary | ICD-10-CM

## 2019-02-20 DIAGNOSIS — R600 Localized edema: Secondary | ICD-10-CM | POA: Diagnosis not present

## 2019-02-20 HISTORY — DX: Mixed hyperlipidemia: E78.2

## 2019-02-20 LAB — COMPREHENSIVE METABOLIC PANEL
ALT: 30 U/L (ref 0–35)
AST: 27 U/L (ref 0–37)
Albumin: 4.1 g/dL (ref 3.5–5.2)
Alkaline Phosphatase: 72 U/L (ref 39–117)
BUN: 14 mg/dL (ref 6–23)
CO2: 29 mEq/L (ref 19–32)
Calcium: 9 mg/dL (ref 8.4–10.5)
Chloride: 103 mEq/L (ref 96–112)
Creatinine, Ser: 1.17 mg/dL (ref 0.40–1.20)
GFR: 46.94 mL/min — ABNORMAL LOW (ref >60.00)
Glucose, Bld: 97 mg/dL (ref 70–99)
Potassium: 4.1 mEq/L (ref 3.5–5.1)
Sodium: 140 mEq/L (ref 135–145)
Total Bilirubin: 0.8 mg/dL (ref 0.2–1.2)
Total Protein: 6.3 g/dL (ref 6.0–8.3)

## 2019-02-20 LAB — LIPID PANEL
Cholesterol: 200 mg/dL (ref 0–200)
HDL: 80.4 mg/dL (ref >39.00)
LDL Cholesterol: 95 mg/dL (ref 0–99)
NonHDL: 119.56
Total CHOL/HDL Ratio: 2
Triglycerides: 123 mg/dL (ref 0.0–149.0)
VLDL: 24.6 mg/dL (ref 0.0–40.0)

## 2019-02-20 NOTE — Progress Notes (Signed)
Subjective  CC:  Chief Complaint  Patient presents with  . Transitions Of Care  . Hyperlipidemia    HPI: Susan Burch is a 63 y.o. female who presents to [facility] today to establish care with me as a new patient.   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 I reviewed her chart in detail. Mostly healthy.   Currently being treated for HLD with low dose crestor and tolerating it well. Due for lipid recheck and lfts. No myalgias. No htn or diabetes. No FH of premature CAD  Mild intermittent dependent edema, mostly after 12 hour shifts. Uses prn lasix.reviewed recent echocardiogram: mild diastolic dysfunction and LVH, nl EF.   Low thyroid controlled and currently managed by Dr. Renne Crigler  MP asthma; rare exacrebation. Has seen Dr. Donneta Romberg in distant past. Does well w/o exacerbations and rare albuterol use.   Chronic ambien use for poor sleep.   HM is up to date.   Assessment  1. Mixed hyperlipidemia   2. Bilateral lower extremity edema   3. Hypothyroidism (acquired)   4. Mild persistent asthma without complication   5. Sleep initiation dysfunction      Plan   HLD: recheck levels.   Edema: intermittent lasix, dependent.   Low thyroid is controlled.   asthma is well controlled clinically  Chronic ambien use.   Follow up:  Return in about 4 months (around 06/23/2019) for complete physical. Orders Placed This Encounter  Procedures  . Comprehensive metabolic panel  . Lipid panel   No orders of the defined types were placed in this encounter.    Depression screen PHQ 2/9 06/05/2018  Decreased Interest 0  Down, Depressed, Hopeless 0  PHQ - 2 Score 0    We updated and reviewed the patient's past history in detail and it is documented below.  Patient Active Problem List   Diagnosis Date Noted  . Mixed hyperlipidemia 02/20/2019    Priority: High  .  Situational mixed anxiety and depressive disorder 11/12/2018    Priority: High  . Polycythemia, mild, evaluation by Dr Irene Limbo 2020 09/05/2018    Priority: High    Evaluation by Dr. Martin Majestic.  -Discussed patient's most recent labs from 06/05/18, HGB at 16.2 and HCT at 47.8. Other blood counts normal. GFR slightly low at 53.31, other chemistries are normal. -Reviewed previous labs; earliest available HGB from 05/26/15 was at 15.6 with a HCT of 46.3. 09/21/16 HGB at 15.5. 05/30/17 HGB at 15.6. -Discussed that the patient's polycythemia is borderline, and after looking at the trend over the last 3.5 years, she has not had progression nor other blood count abnormalities. Do not suspect primary bone marrow disorder such as polycythemia vera. -Discussed that her mild polycythemia is likely secondary from hemo-concentration from diuretics vs lower oxygen levels due to asthma vs lower oxygen levels due to sleep apnea vs fluctuating thyroid levels -Recommend pursuing sleep study with PCP to rule out sleep apnea, which could be a cause of mild polycythemia, and pt does endorse snoring   . Bilateral lower extremity edema 04/11/2018    Priority: High    Echocardiogram [id number]: mild diastolic dysfunction; nl EF. Mild LVH   . Hypothyroidism (acquired) 11/06/2006    Priority: High    Qualifier: Diagnosis of  By: Cari Caraway RN, Ventura Sellers    . Former smoker, 32 pack year history, quit 2006  11/12/2018    Priority: Medium  . Episodic tension-type headache, not intractable 11/12/2018    Priority: Medium  . Mild persistent asthma 09/21/2016    Priority: Medium  . GERD 11/06/2006    Priority: Medium    Qualifier: Diagnosis of  By: Cari Caraway RN, Ventura Sellers    . Allergic rhinitis 11/06/2006    Priority: Low    Qualifier: Diagnosis of  By: Cari Caraway RN, Ventura Sellers    . Postmenopausal disorder 02/14/2013  . Sleep initiation dysfunction 09/27/2011   Health Maintenance  Topic Date Due  . MAMMOGRAM   11/21/2019  . TETANUS/TDAP  04/25/2021  . PAP SMEAR-Modifier  06/05/2021  . COLONOSCOPY  11/15/2028  . INFLUENZA VACCINE  Completed  . Hepatitis C Screening  Completed  . HIV Screening  Completed   Immunization History  Administered Date(s) Administered  . Influenza, Quadrivalent, Recombinant, Inj, Pf 01/21/2019  . Influenza-Unspecified 01/23/2014, 12/25/2014, 01/23/2018  . Pneumococcal Conjugate-13 11/28/2006  . Pneumococcal Polysaccharide-23 02/14/2013  . Td 07/03/2006  . Zoster Recombinat (Shingrix) 01/21/2019   Current Meds  Medication Sig  . albuterol (PROVENTIL HFA) 108 (90 Base) MCG/ACT inhaler Inhale 2 puffs into the lungs every 6 (six) hours as needed.  . baclofen (LIORESAL) 20 MG tablet Take 1 tablet (20 mg total) by mouth 3 (three) times daily.  . budesonide-formoterol (SYMBICORT) 160-4.5 MCG/ACT inhaler INHALE 2 PUFFS BY MOUTH 2 TIMES DAILY.  . furosemide (LASIX) 20 MG tablet Take 1 tablet (20 mg total) by mouth daily.  . montelukast (SINGULAIR) 10 MG tablet Take 1 tablet (10 mg total) by mouth at bedtime.  . Multiple Vitamin (MULTIVITAMIN) tablet Take 1 tablet by mouth daily.  . potassium chloride (KLOR-CON) 10 MEQ tablet Take 1 tablet (10 mEq total) by mouth daily.  . rosuvastatin (CRESTOR) 5 MG tablet Take 1 tablet (5 mg total) by mouth daily.  Marland Kitchen SYNTHROID 125 MCG tablet TAKE 1 TABLET BY MOUTH DAILY BEFORE BREAKFAST.  Marland Kitchen zolpidem (AMBIEN) 5 MG tablet TAKE 1 TABLET (5 MG TOTAL) BY MOUTH AT BEDTIME AS NEEDED.  . [DISCONTINUED] clobetasol (TEMOVATE) 0.05 % external solution     Allergies: Patient is allergic to bactrim and penicillins. Past Medical History Patient  has a past medical history of GERD without esophagitis, HLD (hyperlipidemia), Hypothyroidism, Mixed hyperlipidemia (02/20/2019), and Sleep initiation dysfunction (09/27/2011). Past Surgical History Patient  has a past surgical history that includes Breast excisional biopsy (Right, 1990); Myomectomy (1993);  Cholecystectomy (1998); and Cesarean section (1994, 1996). Family History: Patient family history includes Atrial fibrillation in her maternal uncle; Breast cancer in her cousin; Cancer in her cousin and maternal aunt; Diabetes in her brother and maternal grandmother; Heart Problems in her maternal aunt and mother; High Cholesterol in her father; High blood pressure in her father and maternal uncle; Hypothyroidism in her daughter, maternal aunt, maternal uncle, mother, and son. Social History:  Patient  reports that she quit smoking about 14 years ago. Her smoking use included cigarettes. She has a 32.00 pack-year smoking history. She has never used smokeless tobacco. She reports current alcohol use of about 14.0 standard drinks of alcohol per week. She reports that she does not use drugs.  Review of Systems: Constitutional: negative for fever or malaise Ophthalmic: negative for photophobia, double vision or loss of vision Cardiovascular: negative for chest pain, dyspnea on exertion,  Respiratory: negative for SOB or persistent cough Gastrointestinal: negative for abdominal pain, change in bowel habits or melena Genitourinary: negative for dysuria or gross hematuria Musculoskeletal: negative  for new gait disturbance or muscular weakness Integumentary: negative for new or persistent rashes Neurological: negative for TIA or stroke symptoms Psychiatric: negative for SI or delusions Allergic/Immunologic: negative for hives  Patient Care Team    Relationship Specialty Notifications Start End  Leamon Arnt, MD PCP - General Family Medicine  02/20/19   Lyndal Pulley, DO Consulting Physician Family Medicine  04/11/18   Philemon Kingdom, MD Consulting Physician Internal Medicine  04/11/18   Mosetta Anis, MD Referring Physician Allergy  04/11/18   Servando Salina, MD Consulting Physician Obstetrics and Gynecology  04/11/18   Leamon Arnt, MD Consulting Physician Family Medicine   02/20/19 02/20/19    Objective  Vitals: BP 108/78   Pulse 65   Temp 98 F (36.7 C) (Tympanic)   Resp 16   Ht 5' 2.5" (1.588 m)   Wt 210 lb 12.8 oz (95.6 kg)   SpO2 96%   BMI 37.94 kg/m  General:  Well developed, well nourished, no acute distress  Psych:  Alert and oriented,normal mood and affect HEENT:  Normocephalic, atraumatic, non-icteric sclera, PERRL, oropharynx is without mass or exudate, supple neck without adenopathy, mass or thyromegaly Cardiovascular:  RRR without gallop, rub or murmur, nondisplaced PMI Respiratory:  Good breath sounds bilaterally, CTAB with normal respiratory effort MSK: no deformities, contusions. Joints are without erythema or swelling Skin:  Warm, no rashes or suspicious lesions noted Neurologic:    Mental status is normal. Gross motor and sensory exams are normal. Normal gait   Commons side effects, risks, benefits, and alternatives for medications and treatment plan prescribed today were discussed, and the patient expressed understanding of the given instructions. Patient is instructed to call or message via MyChart if he/she has any questions or concerns regarding our treatment plan. No barriers to understanding were identified. We discussed Red Flag symptoms and signs in detail. Patient expressed understanding regarding what to do in case of urgent or emergency type symptoms.   Medication list was reconciled, printed and provided to the patient in AVS. Patient instructions and summary information was reviewed with the patient as documented in the AVS. This note was prepared with assistance of Dragon voice recognition software. Occasional wrong-word or sound-a-like substitutions may have occurred due to the inherent limitations of voice recognition software

## 2019-02-20 NOTE — Patient Instructions (Signed)
Please return in Feb 2021 for your annual complete physical; please come fasting.  I will release your lab results to you on your MyChart account with further instructions. Please reply with any questions.    It was a pleasure meeting you today! Thank you for choosing [place] to meet your healthcare needs! I truly look forward to working with you. If you have any questions or concerns, please send me a message via Mychart or call the office at [phone].

## 2019-02-22 MED FILL — SYMBICORT 160-4.5 MCG INH: 160-4.5 | 90 days supply | Qty: 31 | Fill #2

## 2019-02-26 MED FILL — MONTELUKAST SOD 10 MG TAB: 10 | 90 days supply | Qty: 90 | Fill #2

## 2019-03-07 MED FILL — SYNTHROID 125 MCG TABLET: 125 | 90 days supply | Qty: 90 | Fill #0

## 2019-03-08 MED FILL — ROSUVASTATIN CALCIUM 5 MG T: 5 | 90 days supply | Qty: 90 | Fill #2

## 2019-04-29 MED FILL — ZOLPIDEM TARTRATE 5 MG TAB: 5 | 90 days supply | Qty: 90 | Fill #1

## 2019-05-07 ENCOUNTER — Encounter: Payer: Self-pay | Admitting: Family Medicine

## 2019-05-17 MED FILL — SYMBICORT 160-4.5 MCG INH: 160-4.5 | 90 days supply | Qty: 31 | Fill #3

## 2019-05-23 MED FILL — SYNTHROID 125 MCG TABLET: 125 | 90 days supply | Qty: 90 | Fill #1

## 2019-06-10 MED FILL — MONTELUKAST SOD 10 MG TAB: 10 | 90 days supply | Qty: 90 | Fill #3

## 2019-06-11 ENCOUNTER — Encounter: Payer: 59 | Admitting: Family Medicine

## 2019-06-12 ENCOUNTER — Other Ambulatory Visit: Payer: Self-pay | Admitting: Family Medicine

## 2019-06-12 ENCOUNTER — Other Ambulatory Visit: Payer: Self-pay

## 2019-06-12 ENCOUNTER — Ambulatory Visit (INDEPENDENT_AMBULATORY_CARE_PROVIDER_SITE_OTHER): Payer: No Typology Code available for payment source | Admitting: Family Medicine

## 2019-06-12 ENCOUNTER — Encounter: Payer: Self-pay | Admitting: Family Medicine

## 2019-06-12 VITALS — BP 136/72 | HR 76 | Temp 96.4°F | Ht 62.5 in | Wt 217.6 lb

## 2019-06-12 DIAGNOSIS — K219 Gastro-esophageal reflux disease without esophagitis: Secondary | ICD-10-CM

## 2019-06-12 DIAGNOSIS — J4530 Mild persistent asthma, uncomplicated: Secondary | ICD-10-CM | POA: Diagnosis not present

## 2019-06-12 DIAGNOSIS — E039 Hypothyroidism, unspecified: Secondary | ICD-10-CM

## 2019-06-12 DIAGNOSIS — Z0000 Encounter for general adult medical examination without abnormal findings: Secondary | ICD-10-CM | POA: Diagnosis not present

## 2019-06-12 DIAGNOSIS — G4700 Insomnia, unspecified: Secondary | ICD-10-CM

## 2019-06-12 DIAGNOSIS — E669 Obesity, unspecified: Secondary | ICD-10-CM | POA: Insufficient documentation

## 2019-06-12 DIAGNOSIS — J301 Allergic rhinitis due to pollen: Secondary | ICD-10-CM | POA: Diagnosis not present

## 2019-06-12 DIAGNOSIS — E782 Mixed hyperlipidemia: Secondary | ICD-10-CM

## 2019-06-12 DIAGNOSIS — R600 Localized edema: Secondary | ICD-10-CM

## 2019-06-12 LAB — CBC WITH DIFFERENTIAL/PLATELET
Basophils Absolute: 0 10*3/uL (ref 0.0–0.1)
Basophils Relative: 0.4 % (ref 0.0–3.0)
Eosinophils Absolute: 0.1 10*3/uL (ref 0.0–0.7)
Eosinophils Relative: 1.6 % (ref 0.0–5.0)
HCT: 44 % (ref 36.0–46.0)
Hemoglobin: 15.1 g/dL — ABNORMAL HIGH (ref 12.0–15.0)
Lymphocytes Relative: 16.2 % (ref 12.0–46.0)
Lymphs Abs: 1.5 10*3/uL (ref 0.7–4.0)
MCHC: 34.4 g/dL (ref 30.0–36.0)
MCV: 95.2 fl (ref 78.0–100.0)
Monocytes Absolute: 0.9 10*3/uL (ref 0.1–1.0)
Monocytes Relative: 10.3 % (ref 3.0–12.0)
Neutro Abs: 6.6 10*3/uL (ref 1.4–7.7)
Neutrophils Relative %: 71.5 % (ref 43.0–77.0)
Platelets: 254 10*3/uL (ref 150.0–400.0)
RBC: 4.62 Mil/uL (ref 3.87–5.11)
RDW: 14.1 % (ref 11.5–15.5)
WBC: 9.2 10*3/uL (ref 4.0–10.5)

## 2019-06-12 LAB — VITAMIN B12: Vitamin B-12: 470 pg/mL (ref 211–911)

## 2019-06-12 MED ORDER — MONTELUKAST SODIUM 10 MG PO TABS: 10.0000 mg | ORAL_TABLET | Freq: Every day | ORAL | 3 refills | Status: DC

## 2019-06-12 MED ORDER — ZOLPIDEM TARTRATE 5 MG PO TABS: 5.0000 mg | ORAL_TABLET | Freq: Every evening | ORAL | 3 refills | Status: DC | PRN

## 2019-06-12 MED ORDER — BUDESONIDE-FORMOTEROL FUMARATE 160-4.5 MCG/ACT IN AERO: INHALATION_SPRAY | RESPIRATORY_TRACT | 4 refills | Status: DC

## 2019-06-12 MED ORDER — ROSUVASTATIN CALCIUM 5 MG PO TABS: 5.0000 mg | ORAL_TABLET | Freq: Every day | ORAL | 3 refills | Status: DC

## 2019-06-12 MED ORDER — ESOMEPRAZOLE MAGNESIUM 20 MG PO CPDR: 20.0000 mg | DELAYED_RELEASE_CAPSULE | Freq: Every day | ORAL | 3 refills | Status: DC

## 2019-06-12 MED FILL — ROSUVASTATIN CALCIUM 5 MG T: 5 | 90 days supply | Qty: 90 | Fill #0

## 2019-06-12 MED FILL — ESOMEPRAZOLE MAG DR 20 MG C: 20 | 90 days supply | Qty: 90 | Fill #0

## 2019-06-12 NOTE — Patient Instructions (Signed)
Please return in 12 months for your annual complete physical; please come fasting.  I have referred you back to see Dr Donneta Romberg. We will call with an appointment.   I have refilled your medications and added nexium to help with heart burn.  Get moving again to help your stamina. Return if you have further problems or concerns regarding your breathing.  Work on [employer].   If you have any questions or concerns, please don't hesitate to send me a message via MyChart or call the office at [phone]. Thank you for visiting with Susan Burch today! It's our pleasure caring for you.   Fat and Cholesterol Restricted Eating Plan Getting too much fat and cholesterol in your diet may cause health problems. Choosing the right foods helps keep your fat and cholesterol at normal levels. This can keep you from getting certain diseases. Your doctor may recommend an eating plan that includes:  Total fat: ______% or less of total calories a day.  Saturated fat: ______% or less of total calories a day.  Cholesterol: less than _________mg a day.  Fiber: ______g a day. What are tips for following this plan? Meal planning  At meals, divide your plate into four equal parts: ? Fill one-half of your plate with vegetables and green salads. ? Fill one-fourth of your plate with whole grains. ? Fill one-fourth of your plate with low-fat (lean) protein foods.  Eat fish that is high in omega-3 fats at least two times a week. This includes mackerel, tuna, sardines, and salmon.  Eat foods that are high in fiber, such as whole grains, beans, apples, broccoli, carrots, peas, and barley. General tips   Work with your doctor to lose weight if you need to.  Avoid: ? Foods with added sugar. ? Fried foods. ? Foods with partially hydrogenated oils.  Limit alcohol intake to no more than 1 drink a day for nonpregnant women and 2 drinks a day for men. One drink equals 12 oz of beer, 5 oz of wine, or 1 oz of hard  liquor. Reading food labels  Check food labels for: ? Trans fats. ? Partially hydrogenated oils. ? Saturated fat (g) in each serving. ? Cholesterol (mg) in each serving. ? Fiber (g) in each serving.  Choose foods with healthy fats, such as: ? Monounsaturated fats. ? Polyunsaturated fats. ? Omega-3 fats.  Choose grain products that have whole grains. Look for the word "whole" as the first word in the ingredient list. Cooking  Cook foods using low-fat methods. These include baking, boiling, grilling, and broiling.  Eat more home-cooked foods. Eat at restaurants and buffets less often.  Avoid cooking using saturated fats, such as butter, cream, palm oil, palm kernel oil, and coconut oil. Recommended foods  Fruits  All fresh, canned (in natural juice), or frozen fruits. Vegetables  Fresh or frozen vegetables (raw, steamed, roasted, or grilled). Green salads. Grains  Whole grains, such as whole wheat or whole grain breads, crackers, cereals, and pasta. Unsweetened oatmeal, bulgur, barley, quinoa, or brown rice. Corn or whole wheat flour tortillas. Meats and other protein foods  Ground beef (85% or leaner), grass-fed beef, or beef trimmed of fat. Skinless chicken or [place]. Ground chicken or [place]. Pork trimmed of fat. All fish and seafood. Egg whites. Dried beans, peas, or lentils. Unsalted nuts or seeds. Unsalted canned beans. Nut butters without added sugar or oil. Dairy  Low-fat or nonfat dairy products, such as skim or 1% milk, 2% or reduced-fat cheeses, low-fat and  fat-free ricotta or cottage cheese, or plain low-fat and nonfat yogurt. Fats and oils  Tub margarine without trans fats. Light or reduced-fat mayonnaise and salad dressings. Avocado. Olive, canola, sesame, or safflower oils. The items listed above may not be a complete list of foods and beverages you can eat. Contact a dietitian for more information. Foods to avoid Fruits  Canned fruit in heavy syrup. Fruit  in cream or butter sauce. Fried fruit. Vegetables  Vegetables cooked in cheese, cream, or butter sauce. Fried vegetables. Grains  White bread. White pasta. White rice. Cornbread. Bagels, pastries, and croissants. Crackers and snack foods that contain trans fat and hydrogenated oils. Meats and other protein foods  Fatty cuts of meat. Ribs, chicken wings, bacon, sausage, bologna, salami, chitterlings, fatback, hot dogs, bratwurst, and packaged lunch meats. Liver and organ meats. Whole eggs and egg yolks. Chicken and [place] with skin. Fried meat. Dairy  Whole or 2% milk, cream, half-and-half, and cream cheese. Whole milk cheeses. Whole-fat or sweetened yogurt. Full-fat cheeses. Nondairy creamers and whipped toppings. Processed cheese, cheese spreads, and cheese curds. Beverages  Alcohol. Sugar-sweetened drinks such as sodas, lemonade, and fruit drinks. Fats and oils  Butter, stick margarine, lard, shortening, ghee, or bacon fat. Coconut, palm kernel, and palm oils. Sweets and desserts  Corn syrup, sugars, honey, and molasses. Candy. Jam and jelly. Syrup. Sweetened cereals. Cookies, pies, cakes, donuts, muffins, and ice cream. The items listed above may not be a complete list of foods and beverages you should avoid. Contact a dietitian for more information. Summary  Choosing the right foods helps keep your fat and cholesterol at normal levels. This can keep you from getting certain diseases.  At meals, fill one-half of your plate with vegetables and green salads.  Eat high-fiber foods, like whole grains, beans, apples, carrots, peas, and barley.  Limit added sugar, saturated fats, alcohol, and fried foods. This information is not intended to replace advice given to you by your health care provider. Make sure you discuss any questions you have with your health care provider. Document Revised: 12/13/2017 Document Reviewed: 12/27/2016 Elsevier Patient Education  2020 Elsevier  Inc.   Preventive Care 3-35 Years Old, Female Preventive care refers to visits with your health care provider and lifestyle choices that can promote health and wellness. This includes:  A yearly physical exam. This may also be called an annual well check.  Regular dental visits and eye exams.  Immunizations.  Screening for certain conditions.  Healthy lifestyle choices, such as eating a healthy diet, getting regular exercise, not using drugs or products that contain nicotine and tobacco, and limiting alcohol use. What can I expect for my preventive care visit? Physical exam Your health care provider will check your:  Height and weight. This may be used to calculate body mass index (BMI), which tells if you are at a healthy weight.  Heart rate and blood pressure.  Skin for abnormal spots. Counseling Your health care provider may ask you questions about your:  Alcohol, tobacco, and drug use.  Emotional well-being.  Home and relationship well-being.  Sexual activity.  Eating habits.  Work and work Statistician.  Method of birth control.  Menstrual cycle.  Pregnancy history. What immunizations do I need?  Influenza (flu) vaccine  This is recommended every year. Tetanus, diphtheria, and pertussis (Tdap) vaccine  You may need a Td booster every 10 years. Varicella (chickenpox) vaccine  You may need this if you have not been vaccinated. Zoster (shingles) vaccine  You  may need this after age 58. Measles, mumps, and rubella (MMR) vaccine  You may need at least one dose of MMR if you were born in 1957 or later. You may also need a second dose. Pneumococcal conjugate (PCV13) vaccine  You may need this if you have certain conditions and were not previously vaccinated. Pneumococcal polysaccharide (PPSV23) vaccine  You may need one or two doses if you smoke cigarettes or if you have certain conditions. Meningococcal conjugate (MenACWY) vaccine  You may need this  if you have certain conditions. Hepatitis A vaccine  You may need this if you have certain conditions or if you travel or work in places where you may be exposed to hepatitis A. Hepatitis B vaccine  You may need this if you have certain conditions or if you travel or work in places where you may be exposed to hepatitis B. Haemophilus influenzae type b (Hib) vaccine  You may need this if you have certain conditions. Human papillomavirus (HPV) vaccine  If recommended by your health care provider, you may need three doses over 6 months. You may receive vaccines as individual doses or as more than one vaccine together in one shot (combination vaccines). Talk with your health care provider about the risks and benefits of combination vaccines. What tests do I need? Blood tests  Lipid and cholesterol levels. These may be checked every 5 years, or more frequently if you are over 35 years old.  Hepatitis C test.  Hepatitis B test. Screening  Lung cancer screening. You may have this screening every year starting at age 29 if you have a 30-pack-year history of smoking and currently smoke or have quit within the past 15 years.  Colorectal cancer screening. All adults should have this screening starting at age 74 and continuing until age 59. Your health care provider may recommend screening at age 70 if you are at increased risk. You will have tests every 1-10 years, depending on your results and the type of screening test.  Diabetes screening. This is done by checking your blood sugar (glucose) after you have not eaten for a while (fasting). You may have this done every 1-3 years.  Mammogram. This may be done every 1-2 years. Talk with your health care provider about when you should start having regular mammograms. This may depend on whether you have a family history of breast cancer.  BRCA-related cancer screening. This may be done if you have a family history of breast, ovarian, tubal, or  peritoneal cancers.  Pelvic exam and Pap test. This may be done every 3 years starting at age 73. Starting at age 6, this may be done every 5 years if you have a Pap test in combination with an HPV test. Other tests  Sexually transmitted disease (STD) testing.  Bone density scan. This is done to screen for osteoporosis. You may have this scan if you are at high risk for osteoporosis. Follow these instructions at home: Eating and drinking  Eat a diet that includes fresh fruits and vegetables, whole grains, lean protein, and low-fat dairy.  Take vitamin and mineral supplements as recommended by your health care provider.  Do not drink alcohol if: ? Your health care provider tells you not to drink. ? You are pregnant, may be pregnant, or are planning to become pregnant.  If you drink alcohol: ? Limit how much you have to 0-1 drink a day. ? Be aware of how much alcohol is in your drink. In the [place]  one drink equals one 12 oz bottle of beer (355 mL), one 5 oz glass of wine (148 mL), or one 1 oz glass of hard liquor (44 mL). Lifestyle  Take daily care of your teeth and gums.  Stay active. Exercise for at least 30 minutes on 5 or more days each week.  Do not use any products that contain nicotine or tobacco, such as cigarettes, e-cigarettes, and chewing tobacco. If you need help quitting, ask your health care provider.  If you are sexually active, practice safe sex. Use a condom or other form of birth control (contraception) in order to prevent pregnancy and STIs (sexually transmitted infections).  If told by your health care provider, take low-dose aspirin daily starting at age 18. What's next?  Visit your health care provider once a year for a well check visit.  Ask your health care provider how often you should have your eyes and teeth checked.  Stay up to date on all vaccines. This information is not intended to replace advice given to you by your health care provider. Make  sure you discuss any questions you have with your health care provider. Document Revised: 12/21/2017 Document Reviewed: 12/21/2017 Elsevier Patient Education  2020 [employer].

## 2019-06-12 NOTE — Progress Notes (Signed)
Subjective  Chief Complaint  Patient presents with  . Annual Exam    fasting. no new concerns  . Hypothyroidism    takes synthroid daily. no side effects.  . Hyperlipidemia    takes rosuvastatin daily. no side effects. diet is stable.    HPI: Susan Burch is a 64 y.o. female who presents to [facility] today for a Female Wellness Visit. She also has the concerns and/or needs as listed above in the chief complaint. These will be addressed in addition to the Health Maintenance Visit.   Wellness Visit: annual visit with health maintenance review and exam without Pap   HM: up to date on pap, mammo and CRC screens. imms up to date. Has gained weight due to eating more over holidays. 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 Notes gets winded easier with running/activities. Chronic disease f/u and/or acute problem visit: (deemed necessary to be done in addition to the wellness visit):  Hypothyroidism: at goal in October when last checked. Clinically feels ok. Compliant with meds  Asthma and allergies: asking is she now has copd? Reports she will get out of breath easier now. Denies wheezing or chest tightness. Uses albuterol rarely. Was a long term former smoker but was dxd with asthma after she quit. She has seen Dr. Donneta Romberg in the past. No recent exacerbations. No cough in am. No chest pain.   HLD on statin and controlled now. No AEs  Gerd: admits to daily heartburn in spite of daily pepcid ac. No melena or emesis. Takes tums sometimes as well. Worse over the last 3 months which correlates w/ worse eating habits and weight loss.  Leg edema, dependent and mild diastolic chf: prn lasix is helpful. Uses with lasix. No changes. No orthopnea or persistent edema  Assessment  1. Annual physical exam   2. Hypothyroidism (acquired)   3. Gastroesophageal reflux disease without esophagitis   4. Mild persistent asthma without complication   5. Non-seasonal  allergic rhinitis due to pollen   6. Sleep initiation dysfunction   7. Mixed hyperlipidemia   8. Bilateral lower extremity edema   9. Obesity (BMI 30-39.9)      Plan  Female Wellness Visit:  Age appropriate Health Maintenance and Prevention measures were discussed with patient. Included topics are cancer screening recommendations, ways to keep healthy (see AVS) including dietary and exercise recommendations, regular eye and dental care, use of seat belts, and avoidance of moderate alcohol use and tobacco use. Screens are up to date  BMI: discussed patient's BMI and encouraged positive lifestyle modifications to help get to or maintain a target BMI. Discussed improving nutrition and exercise for weight loss. See avs  HM needs and immunizations were addressed and ordered. See below for orders. See HM and immunization section for updates. utd  Routine labs and screening tests ordered including cmp, cbc and lipids where appropriate.  Discussed recommendations regarding Vit D and calcium supplementation (see AVS)  Chronic disease management visit and/or acute problem visit:  Low thyroid is controlled on meds. No changes today  GERD: active and uncontrolled. Step up therapy to PPI daily x 12 weeks. Education given. Check b12 screen and cbc.  Ashtma: suspect control is adequate but given concerns of feeling more short of breath, rec recheck pfts with Dr. Donneta Romberg. Referral placed. Suspect DOE is in part due to weight gain and deconditioning. We addressed this.   Chronic insomnia on ambien refilled. I reviewed patient's records  from the PMP aware controlled substance registry today.   Follow up: one year for cpe  Orders Placed This Encounter  Procedures  . Vitamin B12  . CBC with Differential/Platelet  . Ambulatory referral to Allergy   Meds ordered this encounter  Medications  . zolpidem (AMBIEN) 5 MG tablet    Sig: Take 1 tablet (5 mg total) by mouth at bedtime as needed.     Dispense:  90 tablet    Refill:  3  . montelukast (SINGULAIR) 10 MG tablet    Sig: Take 1 tablet (10 mg total) by mouth at bedtime.    Dispense:  90 tablet    Refill:  3  . budesonide-formoterol (SYMBICORT) 160-4.5 MCG/ACT inhaler    Sig: INHALE 2 PUFFS BY MOUTH 2 TIMES DAILY.    Dispense:  3 Inhaler    Refill:  4  . rosuvastatin (CRESTOR) 5 MG tablet    Sig: Take 1 tablet (5 mg total) by mouth daily.    Dispense:  90 tablet    Refill:  3  . esomeprazole (NEXIUM) 20 MG capsule    Sig: Take 1 capsule (20 mg total) by mouth daily.    Dispense:  90 capsule    Refill:  3      Lifestyle: Body mass index is 39.17 kg/m. Wt Readings from Last 3 Encounters:  06/12/19 217 lb 9.6 oz (98.7 kg)  02/20/19 210 lb 12.8 oz (95.6 kg)  02/13/19 210 lb (95.3 kg)     Patient Active Problem List   Diagnosis Date Noted  . Mixed hyperlipidemia 02/20/2019    Priority: High  . Situational mixed anxiety and depressive disorder 11/12/2018    Priority: High  . Polycythemia, mild, evaluation by Dr Irene Limbo 2020 09/05/2018    Priority: High    Evaluation by Dr. Martin Majestic.  -Discussed patient's most recent labs from 06/05/18, HGB at 16.2 and HCT at 47.8. Other blood counts normal. GFR slightly low at 53.31, other chemistries are normal. -Reviewed previous labs; earliest available HGB from 05/26/15 was at 15.6 with a HCT of 46.3. 09/21/16 HGB at 15.5. 05/30/17 HGB at 15.6. -Discussed that the patient's polycythemia is borderline, and after looking at the trend over the last 3.5 years, she has not had progression nor other blood count abnormalities. Do not suspect primary bone marrow disorder such as polycythemia vera. -Discussed that her mild polycythemia is likely secondary from hemo-concentration from diuretics vs lower oxygen levels due to asthma vs lower oxygen levels due to sleep apnea vs fluctuating thyroid levels -Recommend pursuing sleep study with PCP to rule out sleep apnea, which could be a cause of  mild polycythemia, and pt does endorse snoring   . Bilateral lower extremity edema 04/11/2018    Priority: High    Echocardiogram [id number]: mild diastolic dysfunction; nl EF. Mild LVH, prn lasix after long shifts at work   . Hypothyroidism (acquired) 11/06/2006    Priority: High    Managed by Dr. Cruzita Lederer    . Former smoker, 32 pack year history, quit 2006 11/12/2018    Priority: Medium  . Episodic tension-type headache, not intractable 11/12/2018    Priority: Medium  . Mild persistent asthma 09/21/2016    Priority: Medium    Has seen Dr. Donneta Romberg in the past   . GERD 11/06/2006    Priority: Medium  . Allergic rhinitis 11/06/2006    Priority: Low  . Obesity (BMI 30-39.9) 06/12/2019  . Postmenopausal disorder 02/14/2013  . Sleep initiation dysfunction  09/27/2011   Health Maintenance  Topic Date Due  . MAMMOGRAM  11/21/2019  . TETANUS/TDAP  04/25/2021  . PAP SMEAR-Modifier  06/05/2021  . COLONOSCOPY  11/15/2028  . INFLUENZA VACCINE  Completed  . Hepatitis C Screening  Completed  . HIV Screening  Completed   Immunization History  Administered Date(s) Administered  . Influenza, Quadrivalent, Recombinant, Inj, Pf 01/21/2019  . Influenza-Unspecified 01/23/2014, 12/25/2014, 01/23/2018  . Pneumococcal Conjugate-13 11/28/2006  . Pneumococcal Polysaccharide-23 02/14/2013  . Td 07/03/2006  . Zoster Recombinat (Shingrix) 01/21/2019   We updated and reviewed the patient's past history in detail and it is documented below. Allergies: Patient is allergic to bactrim and penicillins. Past Medical History Patient  has a past medical history of GERD without esophagitis, HLD (hyperlipidemia), Hypothyroidism, Mixed hyperlipidemia (02/20/2019), and Sleep initiation dysfunction (09/27/2011). Past Surgical History Patient  has a past surgical history that includes Breast excisional biopsy (Right, 1990); Myomectomy (1993); Cholecystectomy (1998); and Cesarean section (1994, 1996). Family  History: Patient family history includes Atrial fibrillation in her maternal uncle; Breast cancer in her cousin; Cancer in her cousin and maternal aunt; Diabetes in her brother and maternal grandmother; Heart Problems in her maternal aunt and mother; High Cholesterol in her father; High blood pressure in her father and maternal uncle; Hypothyroidism in her daughter, maternal aunt, maternal uncle, mother, and son. Social History:  Patient  reports that she quit smoking about 15 years ago. Her smoking use included cigarettes. She has a 32.00 pack-year smoking history. She has never used smokeless tobacco. She reports current alcohol use of about 14.0 standard drinks of alcohol per week. She reports that she does not use drugs.  Review of Systems: Constitutional: negative for fever or malaise Ophthalmic: negative for photophobia, double vision or loss of vision Cardiovascular: negative for chest pain, dyspnea on exertion, or new LE swelling Respiratory: + for SOB neg for persistent cough Gastrointestinal: negative for abdominal pain, change in bowel habits or melena Genitourinary: negative for dysuria or gross hematuria, no abnormal uterine bleeding or disharge Musculoskeletal: negative for new gait disturbance or muscular weakness Integumentary: negative for new or persistent rashes, no breast lumps Neurological: negative for TIA or stroke symptoms Psychiatric: negative for SI or delusions Allergic/Immunologic: negative for hives  Patient Care Team    Relationship Specialty Notifications Start End  Leamon Arnt, MD PCP - General Family Medicine  02/20/19   Lyndal Pulley, DO Consulting Physician Family Medicine  04/11/18   Philemon Kingdom, MD Consulting Physician Internal Medicine  04/11/18   Mosetta Anis, MD Referring Physician Allergy  04/11/18   Servando Salina, MD Consulting Physician Obstetrics and Gynecology  04/11/18     Objective  Vitals: BP 136/72 (BP Location: Right  Arm, Patient Position: Sitting, Cuff Size: Large)   Pulse 76   Temp (!) 96.4 F (35.8 C) (Temporal)   Ht 5' 2.5" (1.588 m)   Wt 217 lb 9.6 oz (98.7 kg)   SpO2 94%   BMI 39.17 kg/m  General:  Well developed, well nourished, no acute distress  Psych:  Alert and orientedx3,normal mood and affect HEENT:  Normocephalic, atraumatic, non-icteric sclera, PERRL, oropharynx is clear without mass or exudate, supple neck without adenopathy, mass or thyromegaly Cardiovascular:  Normal S1, S2, RRR without gallop, rub or murmur, nondisplaced PMI Respiratory:  Good breath sounds bilaterally, CTAB with normal respiratory effort Gastrointestinal: normal bowel sounds, soft, non-tender, no noted masses. No HSM MSK: no deformities, contusions. Joints are without erythema or swelling. Spine and  CVA region are nontender Skin:  Warm, no rashes or suspicious lesions noted Neurologic:    Mental status is normal. CN 2-11 are normal. Gross motor and sensory exams are normal. Normal gait. No tremor Breast Exam: No mass, skin retraction or nipple discharge is appreciated in either breast. No axillary adenopathy. Fibrocystic changes are not noted  No visits with results within 1 Day(s) from this visit.  Latest known visit with results is:  Office Visit on 02/20/2019  Component Date Value Ref Range Status  . Sodium 02/20/2019 140  135 - 145 mEq/L Final  . Potassium 02/20/2019 4.1  3.5 - 5.1 mEq/L Final  . Chloride 02/20/2019 103  96 - 112 mEq/L Final  . CO2 02/20/2019 29  19 - 32 mEq/L Final  . Glucose, Bld 02/20/2019 97  70 - 99 mg/dL Final  . BUN 02/20/2019 14  6 - 23 mg/dL Final  . Creatinine, Ser 02/20/2019 1.17  0.40 - 1.20 mg/dL Final  . Total Bilirubin 02/20/2019 0.8  0.2 - 1.2 mg/dL Final  . Alkaline Phosphatase 02/20/2019 72  39 - 117 U/L Final  . AST 02/20/2019 27  0 - 37 U/L Final  . ALT 02/20/2019 30  0 - 35 U/L Final  . Total Protein 02/20/2019 6.3  6.0 - 8.3 g/dL Final  . Albumin 02/20/2019 4.1   3.5 - 5.2 g/dL Final  . Calcium 02/20/2019 9.0  8.4 - 10.5 mg/dL Final  . GFR 02/20/2019 46.94* >60.00 mL/min Final  . Cholesterol 02/20/2019 200  0 - 200 mg/dL Final  . Triglycerides 02/20/2019 123.0  0.0 - 149.0 mg/dL Final  . HDL 02/20/2019 80.40  >39.00 mg/dL Final  . VLDL 02/20/2019 24.6  0.0 - 40.0 mg/dL Final  . LDL Cholesterol 02/20/2019 95  0 - 99 mg/dL Final  . Total CHOL/HDL Ratio 02/20/2019 2   Final  . NonHDL 02/20/2019 119.56   Final   Lab Results  Component Value Date   TSH 3.24 02/13/2019      Commons side effects, risks, benefits, and alternatives for medications and treatment plan prescribed today were discussed, and the patient expressed understanding of the given instructions. Patient is instructed to call or message via MyChart if he/she has any questions or concerns regarding our treatment plan. No barriers to understanding were identified. We discussed Red Flag symptoms and signs in detail. Patient expressed understanding regarding what to do in case of urgent or emergency type symptoms.   Medication list was reconciled, printed and provided to the patient in AVS. Patient instructions and summary information was reviewed with the patient as documented in the AVS. This note was prepared with assistance of Dragon voice recognition software. Occasional wrong-word or sound-a-like substitutions may have occurred due to the inherent limitations of voice recognition software  This visit occurred during the SARS-CoV-2 public health emergency.  Safety protocols were in place, including screening questions prior to the visit, additional usage of staff PPE, and extensive cleaning of exam room while observing appropriate contact time as indicated for disinfecting solutions.

## 2019-07-17 MED FILL — SERTRALINE HCL 50 MG TABLET: 50 | 90 days supply | Qty: 90 | Fill #1

## 2019-07-26 MED FILL — ZOLPIDEM TARTRATE 5 MG TAB: 5 | 90 days supply | Qty: 90 | Fill #0

## 2019-08-07 MED FILL — SYMBICORT 160-4.5 MCG INH: 160-4.5 | 90 days supply | Qty: 31 | Fill #0

## 2019-09-09 MED FILL — ROSUVASTATIN CALCIUM 5 MG T: 5 | 90 days supply | Qty: 90 | Fill #1

## 2019-09-12 MED FILL — MONTELUKAST SOD 10 MG TAB: 10 | 90 days supply | Qty: 90 | Fill #0

## 2019-10-02 ENCOUNTER — Encounter: Payer: Self-pay | Admitting: Family Medicine

## 2019-10-02 NOTE — Telephone Encounter (Signed)
Sent message to pt. Needs appt

## 2019-10-02 NOTE — Telephone Encounter (Signed)
[place] referral or do you need to see in office?

## 2019-10-22 MED FILL — ALBUTEROL SULFATE HFA 108 (: 108 (90 BAS | 25 days supply | Qty: 18 | Fill #1

## 2019-10-22 MED FILL — ZOLPIDEM TARTRATE 5 MG TAB: 5 | 90 days supply | Qty: 90 | Fill #1

## 2019-10-29 MED FILL — SERTRALINE HCL 50 MG TABS: 50 | 90 days supply | Qty: 90 | Fill #2

## 2019-11-01 ENCOUNTER — Ambulatory Visit: Payer: No Typology Code available for payment source | Admitting: Family Medicine

## 2019-11-13 ENCOUNTER — Ambulatory Visit
Admission: RE | Admit: 2019-11-13 | Discharge: 2019-11-13 | Disposition: A | Discharge status: Home / Self Care | Payer: No Typology Code available for payment source | Source: Ambulatory Visit | Attending: Acute Care | Admitting: Acute Care

## 2019-11-13 DIAGNOSIS — Z87891 Personal history of nicotine dependence: Secondary | ICD-10-CM

## 2019-11-13 DIAGNOSIS — Z122 Encounter for screening for malignant neoplasm of respiratory organs: Secondary | ICD-10-CM

## 2019-11-14 MED FILL — SYMBICORT 160-4.5 MCG INH: 160-4.5 | 90 days supply | Qty: 31 | Fill #1

## 2019-11-14 NOTE — Progress Notes (Signed)
Please call patient and let them  know their  low dose Ct was read as a Lung RADS 2: nodules that are benign in appearance and behavior with a very low likelihood of becoming a clinically active cancer due to size or lack of growth. Recommendation per radiology is for a repeat LDCT in 12 months. .Please let them  know we will order and schedule their  annual screening scan for 10/2020. Please let them  know there was notation of CAD on their  scan.  Please remind the patient  that this is a non-gated exam therefore degree or severity of disease  cannot be determined. Please have them  follow up with their PCP regarding potential risk factor modification, dietary therapy or pharmacologic therapy if clinically indicated. Pt.  is  currently on statin therapy. Please place order for annual  screening scan for  10/2020 and fax results to PCP. Thanks so much.  Please let patient know there was notation of Hepatic steatosis. This  is a term that describes the build up of fat in the liver. It is normal to have small amounts of fat in your liver, but when the proportion of liver cells that contain fat exceeds more than 5% it is indicative of [place] stage fatty liver.Treatment often involves reducing risk factors through a diet and exercise plan. It is generally a benign condition, but in a small percentage of patients it does require follow up. Please have the patient follow up with PCP regarding potential risk factor modification, dietary therapy or pharmacologic therapy if clinically indicated.

## 2019-11-20 ENCOUNTER — Other Ambulatory Visit: Payer: Self-pay | Admitting: *Deleted

## 2019-11-20 DIAGNOSIS — Z87891 Personal history of nicotine dependence: Secondary | ICD-10-CM

## 2019-11-29 MED FILL — SYNTHROID 125 MCG TABLET: 125 | 90 days supply | Qty: 90 | Fill #3

## 2019-12-05 MED FILL — ROSUVASTATIN CALCIUM 5 MG T: 5 | 90 days supply | Qty: 90 | Fill #2

## 2019-12-11 ENCOUNTER — Ambulatory Visit (INDEPENDENT_AMBULATORY_CARE_PROVIDER_SITE_OTHER): Payer: No Typology Code available for payment source | Admitting: Family Medicine

## 2019-12-11 ENCOUNTER — Other Ambulatory Visit: Payer: Self-pay | Admitting: Family Medicine

## 2019-12-11 ENCOUNTER — Other Ambulatory Visit: Payer: Self-pay

## 2019-12-11 ENCOUNTER — Encounter: Payer: Self-pay | Admitting: Family Medicine

## 2019-12-11 VITALS — BP 140/82 | HR 77 | Temp 97.3°F | Resp 16 | Ht 63.0 in | Wt 213.4 lb

## 2019-12-11 DIAGNOSIS — J432 Centrilobular emphysema: Secondary | ICD-10-CM

## 2019-12-11 DIAGNOSIS — J4521 Mild intermittent asthma with (acute) exacerbation: Secondary | ICD-10-CM

## 2019-12-11 DIAGNOSIS — J301 Allergic rhinitis due to pollen: Secondary | ICD-10-CM

## 2019-12-11 DIAGNOSIS — K219 Gastro-esophageal reflux disease without esophagitis: Secondary | ICD-10-CM

## 2019-12-11 DIAGNOSIS — F4323 Adjustment disorder with mixed anxiety and depressed mood: Secondary | ICD-10-CM | POA: Diagnosis not present

## 2019-12-11 DIAGNOSIS — E669 Obesity, unspecified: Secondary | ICD-10-CM

## 2019-12-11 DIAGNOSIS — Z87891 Personal history of nicotine dependence: Secondary | ICD-10-CM

## 2019-12-11 DIAGNOSIS — J4530 Mild persistent asthma, uncomplicated: Secondary | ICD-10-CM

## 2019-12-11 HISTORY — DX: Centrilobular emphysema: J43.2

## 2019-12-11 MED ORDER — BREZTRI AEROSPHERE 160-9-4.8 MCG/ACT IN AERO: 2.0000 | INHALATION_SPRAY | Freq: Two times a day (BID) | RESPIRATORY_TRACT | 5 refills | Status: DC

## 2019-12-11 MED ORDER — PEAK FLOW METER DEVI: 0 refills | Status: DC

## 2019-12-11 MED FILL — BREZTRI AEROSPHERE 160-9-4.: 160-9-4.8 | 30 days supply | Qty: 11 | Fill #0

## 2019-12-11 MED FILL — PERSONAL BEST PEAK FLOW MTR: 20 days supply | Qty: 1 | Fill #0

## 2019-12-11 NOTE — Progress Notes (Signed)
Subjective  CC:  Chief Complaint  Patient presents with  . Referral    pulmonology - asthma , symptoms are not well controlled   . Covid Vaccine     OK for booster?     HPI: Susan Burch is a 64 y.o. female who presents to the office today to address the problems listed above in the chief complaint.  64 year old female with asthma and emphysema by recent chest CT scan, screening for lung cancer presents due to persistent worsening of respiratory symptoms.  We discussed this back in February.  She has had adult asthma that has been fairly well controlled with allergy medications, Symbicort and as needed albuterol.  However over the course of the last 6 months or so, she finds herself with worsening shortness of breath.  She currently is using her albuterol every morning prior to work.  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  She denies significant wheezing or acute asthma flares.  She also notes that she is very deconditioned citing the example of severe shortness of breath when she needed to run from the ER to the labor and delivery unit with the patient.  Had to sit down to catch her breath for several minutes.  She denied chest pain palpitations or wheezing at that time.  I reviewed her most recent chest CT which was done for lung cancer screening: It did show mild centrilobular emphysema.  She denies pleuritic chest pain, daily cough or sputum production.  She was a longtime smoker but quit in 2016.  She has never been evaluated for COPD.  She does not have up-to-date PFTs.  Allergies have been well controlled.  Uses Singulair Flonase and as needed Zyrtec.  No cardiac history  She is obese.  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  She maintains her home and yard.  She does not do regular cardiovascular exercise.  Anxiety: Now on Zoloft and says symptoms are not very well  controlled.  Assessment  1. Centrilobular emphysema ([place])   2. Mild persistent asthma without complication   3. Obesity (BMI 30-39.9)   4. Situational mixed anxiety and depressive disorder   5. Former smoker, 32 pack year history, quit 2006      Plan   SOB:  Multifactorial, copd could be playing a role, asthma as well. Start checking pre and post albuterol PFTs. Change from symbicort to breztri and monitor if symptoms improve. Continue allergy meds. Monitor for gerd. rec weight loss and cardiovascular exercise to improve fitness level. Recheck 3 months.   Anxiety is well controlled.   Continue PPI.   Follow up: Return in about 3 months (around 03/12/2020) for follow up on breathing.  Visit date not found  No orders of the defined types were placed in this encounter.  Meds ordered this encounter  Medications  . Budeson-Glycopyrrol-Formoterol (BREZTRI AEROSPHERE) 160-9-4.8 MCG/ACT AERO    Sig: Inhale 2 puffs into the lungs in the morning and at bedtime.    Dispense:  10.7 g    Refill:  5  . Peak Flow Meter DEVI    Sig: Use as needed to check your breathing. Log your values    Dispense:  1 each    Refill:  0      I reviewed the patients updated PMH, FH, and SocHx.    Patient Active Problem List  Diagnosis Date Noted  . Centrilobular emphysema ([place]) 12/11/2019    Priority: High  . Obesity (BMI 30-39.9) 06/12/2019    Priority: High  . Mixed hyperlipidemia 02/20/2019    Priority: High  . Situational mixed anxiety and depressive disorder 11/12/2018    Priority: High  . Polycythemia, mild, evaluation by Dr Irene Limbo 2020 09/05/2018    Priority: High  . Bilateral lower extremity edema 04/11/2018    Priority: High  . Sleep initiation dysfunction 09/27/2011    Priority: High  . Hypothyroidism (acquired) 11/06/2006    Priority: High  . Former smoker, 32 pack year history, quit 2006 11/12/2018    Priority: Medium  . Episodic tension-type headache, not intractable 11/12/2018      Priority: Medium  . Mild persistent asthma 09/21/2016    Priority: Medium  . Postmenopausal disorder 02/14/2013    Priority: Medium  . GERD 11/06/2006    Priority: Medium  . Allergic rhinitis 11/06/2006    Priority: Low   Current Meds  Medication Sig  . albuterol (PROVENTIL HFA) 108 (90 Base) MCG/ACT inhaler Inhale 2 puffs into the lungs every 6 (six) hours as needed.  . Ascorbic Acid (VITAMIN C) 500 MG CAPS Take by mouth.  . esomeprazole (NEXIUM) 20 MG capsule Take 1 capsule (20 mg total) by mouth daily.  . furosemide (LASIX) 20 MG tablet Take 1 tablet (20 mg total) by mouth daily.  . montelukast (SINGULAIR) 10 MG tablet Take 1 tablet (10 mg total) by mouth at bedtime.  . Multiple Vitamin (MULTIVITAMIN) tablet Take 1 tablet by mouth daily.  . potassium chloride (KLOR-CON) 10 MEQ tablet Take 1 tablet (10 mEq total) by mouth daily.  . rosuvastatin (CRESTOR) 5 MG tablet Take 1 tablet (5 mg total) by mouth daily.  . sertraline (ZOLOFT) 50 MG tablet Take 50 mg by mouth daily.  Marland Kitchen SYNTHROID 125 MCG tablet TAKE 1 TABLET BY MOUTH DAILY BEFORE BREAKFAST.  Marland Kitchen Vitamin D, Cholecalciferol, 50 MCG (2000 UT) CAPS Take by mouth.  . Zinc 50 MG CAPS Take by mouth.  . zolpidem (AMBIEN) 5 MG tablet Take 1 tablet (5 mg total) by mouth at bedtime as needed.  . [DISCONTINUED] budesonide-formoterol (SYMBICORT) 160-4.5 MCG/ACT inhaler INHALE 2 PUFFS BY MOUTH 2 TIMES DAILY.    Allergies: Patient is allergic to bactrim and penicillins. Family History: Patient family history includes Atrial fibrillation in her maternal uncle; Breast cancer in her cousin; Cancer in her cousin and maternal aunt; Diabetes in her brother and maternal grandmother; Heart Problems in her maternal aunt and mother; High Cholesterol in her father; High blood pressure in her father and maternal uncle; Hypothyroidism in her daughter, maternal aunt, maternal uncle, mother, and son. Social History:  Patient  reports that she quit smoking  about 15 years ago. Her smoking use included cigarettes. She has a 32.00 pack-year smoking history. She has never used smokeless tobacco. She reports current alcohol use of about 14.0 standard drinks of alcohol per week. She reports that she does not use drugs.  Review of Systems: Constitutional: Negative for fever malaise or anorexia Cardiovascular: negative for chest pain Respiratory: negative for SOB or persistent cough Gastrointestinal: negative for abdominal pain  Objective  Vitals: BP 140/82   Pulse 77   Temp (!) 97.3 F (36.3 C) (Temporal)   Resp 16   Ht 5\' 3"  (1.6 m)   Wt 213 lb 6.4 oz (96.8 kg)   SpO2 95%   BMI 37.80 kg/m  General: no acute distress , A&Ox3,  obese HEENT: PEERL, conjunctiva normal, neck is supple, no jvd Cardiovascular:  RRR without murmur or gallop.  Respiratory:  Good breath sounds bilaterally, CTAB with normal respiratory effort, no wheezing Skin:  Warm, no rashes Ext: no edema     Commons side effects, risks, benefits, and alternatives for medications and treatment plan prescribed today were discussed, and the patient expressed understanding of the given instructions. Patient is instructed to call or message via MyChart if he/she has any questions or concerns regarding our treatment plan. No barriers to understanding were identified. We discussed Red Flag symptoms and signs in detail. Patient expressed understanding regarding what to do in case of urgent or emergency type symptoms.   Medication list was reconciled, printed and provided to the patient in AVS. Patient instructions and summary information was reviewed with the patient as documented in the AVS. This note was prepared with assistance of Dragon voice recognition software. Occasional wrong-word or sound-a-like substitutions may have occurred due to the inherent limitations of voice recognition software  This visit occurred during the SARS-CoV-2 public health emergency.  Safety protocols were in  place, including screening questions prior to the visit, additional usage of staff PPE, and extensive cleaning of exam room while observing appropriate contact time as indicated for disinfecting solutions.

## 2019-12-11 NOTE — Patient Instructions (Signed)
Please return in 3 months to recheck your breathing.  Please check your peak flows and log the readings for our review. Can check before and after albuterol, when you are feeling tight or wheezing and when you are feeling well.   Change from symbicort to [facility] to see if this helps.  Start some cardiovascular exercises to build your fitness level.   If you have any questions or concerns, please don't hesitate to send me a message via MyChart or call the office at [phone]. Thank you for visiting with Susan Burch today! It's our pleasure caring for you.   Peak Flow Meter  A peak flow meter is a device that helps you determine how well your asthma is being controlled. The device measures the flow of air out of your lungs. This is a simple but important tool in daily asthma management. Peak flow meters are available over the counter. The readings from the meter will help you and your health care provider:  Determine the severity of your asthma.  Evaluate the effectiveness of your current treatment.  Determine when to add or stop certain medicines.  Recognize an asthma attack before signs or symptoms appear.  Decide when to seek emergency care. What are the risks?  Dizziness. Breathing too quickly into the peak flow meter may cause dizziness. This could cause you to pass out. Take your time so you do not get dizzy or light-headed.  False readings. Not cleaning your meter may cause false results. You may not know that your asthma is getting better or getting worse, making you to start or stop treatment improperly. How to find your personal best Your "personal best" is the highest peak flow rate you can reach when you feel good and have no asthma symptoms. This can be used as your standard for comparing your peak flow meter readings. Because everyone's asthma is different, your personal best will be unique to you. Your health care provider will help you to figure out your personal best.  Typically, you will take readings once or twice a day for 2 weeks when you are not having symptoms. The highest reading during the trial period is your personal best. Because your lung function can change over time, your personal best should be measured each year. How to use your peak flow meter 1. Move the upper marker to the number that is your personal best. 2. Move the lower marker to the bottom of the numbered scale. 3. Connect the mouthpiece to the peak flow meter. 4. Stand up. 5. Take a deep breath. Make sure you fill your lungs completely. 6. Place your lips tightly around the mouthpiece. Blow as hard and as fast as you can with a single breath (forced exhalation), as if you are blowing out candles. If your lips are not placed tightly around the mouthpiece of the peak flow meter, you will get incorrect low readings. 7. At the end of your forced exhale, check to see what number the lower marker landed on. This is your peak flow rate. 8. Move the lower marker back to the bottom of the numbered scale. 9. Repeat these steps 2 more times. Record the highest reading of the 3 tries in your asthma diary. Do not calculate the average for your 3 tries, just record the highest. Always write down the results in your asthma diary. After using your peak flow meter, rest and breathe slowly and easily. Keep a record of your progress. Your health care provider can provide you  with a simple table to help with this. For the most accurate readings, it is important to keep your peak flow meter clean. Follow the manufacturer's instructions on how to take care of your peak flow meter. Your health care provider will give you instructions on when to do regular monitoring. You may also need to check your peak flow when:  Coughing, wheezing, or other asthma symptoms wake you up at night.  Your asthma symptoms worsen during the day.  Your breathing is made worse because of a cold, flu, or other respiratory  illness.  You need to use your quick-relief, "rescue medicine." It is best to check your peak flow rate before you use rescue medicine, and then 20-30 minutes afterward. This will help determine whether you need to take the rescue medicine again. How to use your results Use color-coded zones on the meter to see how your peak flow rate compares to your personal best. If your peak flow readings fall too far below your personal best into the yellow or red zone, you will need to take action to prevent or minimize an asthma attack. The color code for each zone reflects progressively more severe symptoms: Green zone: Good   Peak flow rate between 80% and 100% of your personal best. Your asthma is under good control when your peak flow rate is in the green zone.  When you are in the green zone, you are not likely to be experiencing any asthma symptoms.  Only take your regular, preventive medicine. Your rescue medicine is not required. Yellow zone: Caution   Peak flow rate between 50% and 80% of your personal best. Your asthma is getting worse and could be improved.  You may have begun coughing, wheezing, or feeling chest tightness. Sometimes peak flow rates dip down into the yellow zone before asthma symptoms appear.  Consider increasing or changing your asthma medicine. This may include using your rescue medicine. If you have an asthma action plan, follow each step listed for the yellow zone, including medicine changes. Red zone: Warning   Peak flow rate below 50% of your personal best. This may mean you are having, or are at risk of having, a medical emergency.  Your coughing, wheezing, or shortness of breath may have become severe. Do not wait to use your rescue medicine. Stop whatever you are doing and use your rescue inhaler, nebulizer, or other medicines to open your airways.  If you have an asthma action plan, follow each step listed for the red zone, including medicine changes and when  to seek emergency medical care. Contact a health care provider if: You are in the yellow zone. If you have an asthma action plan, follow all of the steps listed under the yellow zone section of the plan. Let your health care provider know that you are in the yellow zone. Get help right away if: You are in the red zone. If you have an asthma action plan, follow all of the steps listed under the red zone section of the plan while you are seeking immediate medical care. Summary  A peak flow meter is a device that helps you determine how well your asthma is being controlled. The device measures the flow of air out of your lungs.  Readings from the meter will help you determine the severity of your asthma, whether your treatment is working, and when to start or stop treatment.  Measure your personal best every year during periods of no symptoms. The meter will  compare this reading to your regular readings to determine your condition at a given time.  Work with your health care provider to understand what each zone (green, yellow, red) means and what actions to take in each zone. This information is not intended to replace advice given to you by your health care provider. Make sure you discuss any questions you have with your health care provider. Document Revised: 03/24/2017 Document Reviewed: 04/07/2016 Elsevier Patient Education  2020 [employer].

## 2019-12-12 MED FILL — ALBUTEROL SULFATE HFA 108 (: 108 (90 BAS | 25 days supply | Qty: 18 | Fill #0

## 2019-12-17 MED FILL — MONTELUKAST SOD 10 MG TAB: 10 | 90 days supply | Qty: 90 | Fill #1

## 2019-12-24 ENCOUNTER — Encounter: Payer: Self-pay | Admitting: Family Medicine

## 2020-01-07 MED FILL — BREZTRI AEROSPHERE 160-9-4.: 160-9-4.8 | 30 days supply | Qty: 11 | Fill #1

## 2020-01-16 ENCOUNTER — Other Ambulatory Visit: Payer: Self-pay | Admitting: Family Medicine

## 2020-01-16 DIAGNOSIS — G4700 Insomnia, unspecified: Secondary | ICD-10-CM

## 2020-01-16 NOTE — Telephone Encounter (Signed)
Last refill: 06/12/19 #90, 3 Last OV: 12/11/19 dx. emphysema

## 2020-01-17 MED FILL — ZOLPIDEM TARTRATE 5 MG TABS: 5 | 90 days supply | Qty: 90 | Fill #0

## 2020-02-04 MED FILL — BREZTRI AEROSPHERE 160-9-4.: 160-9-4.8 | 30 days supply | Qty: 11 | Fill #2

## 2020-02-04 MED FILL — ESOMEPRAZOLE MAG DR 20 MG C: 20 | 90 days supply | Qty: 90 | Fill #2

## 2020-02-12 ENCOUNTER — Other Ambulatory Visit: Payer: Self-pay

## 2020-02-12 ENCOUNTER — Encounter: Payer: Self-pay | Admitting: Internal Medicine

## 2020-02-12 ENCOUNTER — Ambulatory Visit: Payer: No Typology Code available for payment source | Admitting: Internal Medicine

## 2020-02-12 VITALS — BP 132/70 | HR 78 | Ht 63.0 in | Wt 217.0 lb

## 2020-02-12 DIAGNOSIS — E039 Hypothyroidism, unspecified: Secondary | ICD-10-CM

## 2020-02-12 LAB — T4, FREE: Free T4: 0.71 ng/dL (ref 0.60–1.60)

## 2020-02-12 LAB — TSH: TSH: 14.68 u[IU]/mL — ABNORMAL HIGH (ref 0.35–4.50)

## 2020-02-12 NOTE — Progress Notes (Signed)
Patient ID: [name], female   DOB: Jul 01, 1955, 64 y.o.   MRN: [id number]   This visit occurred during the SARS-CoV-2 public health emergency.  Safety protocols were in place, including screening questions prior to the visit, additional usage of staff PPE, and extensive cleaning of exam room while observing appropriate contact time as indicated for disinfecting solutions.   HPI  Susan Burch is a 64 y.o.-year-old female, returning for follow-up for uncontrolled, acquired, hypothyroidism.  Last visit 1 year ago.  Since last visit, she was diagnosed with mild emphysema on the CT of the chest. She was on Symbicort >> now on Breztri and prn Albuterol.  Reviewed history: Pt. has been dx with hypothyroidism in 1997-1998 after the birth of her son.  She was started on Synthroid then.  When I first saw her, she was on 175 mcg daily, however, she was not taking the medication correctly.  We were able to decrease the dose after she started to take it correctly.  Pt is on Synthroid d.a.w. 125 mcg daily, taken: - in am - fasting - at least 30 min from b'fast and coffee + creamer - no Ca, Fe - + MVI at night, PPIs (Nexium) at lunchtime - not on Biotin  Reviewed her TFTs: Lab Results  Component Value Date   TSH 3.24 02/13/2019   TSH 1.05 06/05/2018   TSH 0.77 02/15/2018   TSH 1.08 11/15/2017   TSH 0.19 (L) 09/28/2017   TSH 0.04 (L) 08/15/2017   TSH 0.03 (L) 05/30/2017   TSH 0.55 09/21/2016   TSH 4.21 08/06/2015   TSH 7.65 (H) 07/01/2015   FREET4 0.96 02/13/2019   FREET4 1.03 06/05/2018   FREET4 1.12 02/15/2018   FREET4 1.07 11/15/2017   FREET4 1.24 09/28/2017   FREET4 1.36 08/15/2017   FREET4 1.18 05/30/2017   T3FREE 3.5 05/30/2017   Pt denies: - feeling nodules in neck - hoarseness - choking - SOB with lying down She had occasional dysphagia due to GERD >> now resolved on Nexium.  She has + FH of thyroid disorders in: M and M aunt and uncle. Also all her children -  hypothyroidism, M aunt. No FH of thyroid cancer. No h/o radiation tx to head or neck.  No seaweed or kelp. No recent contrast studies. No herbal supplements. No Biotin use.   She was getting steroid injections for psoriasis of scalp.  Last 1 year ago.  Pt. also has a history of asthma with cough.  She gets as needed steroids - no recent courses  She started Crestor before last visit.  She also has polycythemia.  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  ROS: Constitutional: no weight gain/no weight loss, no fatigue, no subjective hyperthermia, no subjective hypothermia Eyes: no blurry vision, no xerophthalmia ENT: no sore throat, + see HPI Cardiovascular: no CP/no SOB/no palpitations/no leg swelling Respiratory: no cough/no SOB/no wheezing Gastrointestinal: no N/no V/no D/no C/no acid reflux Musculoskeletal: no muscle aches/no joint aches Skin: no rashes, no hair loss Neurological: no tremors/no numbness/no tingling/no dizziness  I reviewed pt's medications, allergies, PMH, social hx, family hx, and changes were documented in the history of present illness. Otherwise, unchanged from my initial visit note.  Past Medical History:  Diagnosis Date  . Centrilobular emphysema (HCC) 12/11/2019   Mild noted on CT for lung cancer screen 2021; former long term smoker.  Marland Kitchen GERD without esophagitis   . HLD (hyperlipidemia)   . Hypothyroidism   . Mixed  hyperlipidemia 02/20/2019  . Sleep initiation dysfunction 09/27/2011   Past Surgical History:  Procedure Laterality Date  . BREAST EXCISIONAL BIOPSY Right 1990  . [place]  . CHOLECYSTECTOMY  1998  . MYOMECTOMY  1993   Social History   Socioeconomic History  . Marital status: Married    Spouse name: Not on file  . Number of children: 2  .    .    Occupational History  . RN  . Smoking status: Former Smoker    Packs/day: 0.50    Years: 4.00    Pack years: 2.00    Types: Cigarettes    Last attempt to quit: 07/12/2004     Years since quitting: 13.1  . Smokeless tobacco: Never Used  Substance and Sexual Activity  . Alcohol use: Yes    Alcohol/week: 8.4 oz    Types: 14 Glasses of red wine per week    Comment: 2 glasses per day  . Drug use: No   Current Outpatient Medications on File Prior to Visit  Medication Sig Dispense Refill  . albuterol (VENTOLIN HFA) 108 (90 Base) MCG/ACT inhaler INHALE 2 PUFFS BY MOUTH INTO LUNGS EVERY 6 HOURS AS NEEDED 18 g 1  . Ascorbic Acid (VITAMIN C) 500 MG CAPS Take by mouth.    . Budeson-Glycopyrrol-Formoterol (BREZTRI AEROSPHERE) 160-9-4.8 MCG/ACT AERO Inhale 2 puffs into the lungs in the morning and at bedtime. 10.7 g 5  . esomeprazole (NEXIUM) 20 MG capsule Take 1 capsule (20 mg total) by mouth daily. 90 capsule 3  . furosemide (LASIX) 20 MG tablet Take 1 tablet (20 mg total) by mouth daily. (Patient taking differently: Take 20 mg by mouth daily. ) 30 tablet 1  . montelukast (SINGULAIR) 10 MG tablet Take 1 tablet (10 mg total) by mouth at bedtime. 90 tablet 3  . Multiple Vitamin (MULTIVITAMIN) tablet Take 1 tablet by mouth daily.    . Peak Flow Meter DEVI Use as needed to check your breathing. Log your values 1 each 0  . potassium chloride (KLOR-CON) 10 MEQ tablet Take 1 tablet (10 mEq total) by mouth daily. (Patient taking differently: Take 10 mEq by mouth daily. ) 30 tablet 2  . rosuvastatin (CRESTOR) 5 MG tablet Take 1 tablet (5 mg total) by mouth daily. 90 tablet 3  . sertraline (ZOLOFT) 50 MG tablet Take 50 mg by mouth daily.    Marland Kitchen SYNTHROID 125 MCG tablet TAKE 1 TABLET BY MOUTH DAILY BEFORE BREAKFAST. 90 tablet 3  . Vitamin D, Cholecalciferol, 50 MCG (2000 UT) CAPS Take by mouth.    . Zinc 50 MG CAPS Take by mouth.    . zolpidem (AMBIEN) 5 MG tablet TAKE 1 TABLET (5 MG TOTAL) BY MOUTH AT BEDTIME AS NEEDED. 90 tablet 3   No current facility-administered medications on file prior to visit.   Allergies  Allergen Reactions  . Bactrim   . Penicillins     FH:  Diabetes in brother, MGM  HTN in father, maternal uncle  HL in father  Heart disease in mother, maternal aunt, maternal uncle  Thyroid problems-see HPI  Cancer in maternal aunt and first cousin: Multiple myeloma, lymphoma, respectively  PE: BP 132/70   Pulse 78   Ht $R'5\' 3"'Ik$  (1.6 m)   Wt 217 lb (98.4 kg)   SpO2 95%   BMI 38.44 kg/m  Wt Readings from Last 3 Encounters:  02/12/20 217 lb (98.4 kg)  12/11/19 213 lb 6.4 oz (96.8 kg)  06/12/19 217 lb 9.6 oz (98.7 kg)   Constitutional: overweight, in NAD Eyes: PERRLA, EOMI, no exophthalmos ENT: moist mucous membranes, no thyromegaly, no cervical lymphadenopathy Cardiovascular: RRR, No MRG Respiratory: CTA B Gastrointestinal: abdomen soft, NT, ND, BS+ Musculoskeletal: no deformities, strength intact in all 4 Skin: moist, warm, no rashes Neurological: no tremor with outstretched hands, DTR normal in all 4  ASSESSMENT: 1. Hypothyroidism  PLAN:  1. Patient with longstanding acquired hypothyroidism, on levothyroxine therapy.  Her TSH levels have been fluctuating in the past, most likely due to incorrect dosing.  She was taking levothyroxine occasionally multivitamins and occasionally with coffee agreement.  We separate levothyroxine from all these and her TSH improved so we had to back off her levothyroxine dose. - latest thyroid labs reviewed with pt >> normal: Lab Results  Component Value Date   TSH 3.24 02/13/2019   - she continues on LT4 125 mcg daily - pt feels good on this dose. Gained 7 lbs since last OV - we discussed about taking the thyroid hormone every day, with water, >30 minutes before breakfast, separated by >4 hours from acid reflux medications, calcium, iron, multivitamins. Pt. is taking it correctly. - will check thyroid tests today: TSH and fT4 - If labs are abnormal, she will need to return for repeat TFTs in 1.5 months - If labs are normal, I will see her back in a year  Needs  refills.  Component     Latest Ref Rng & Units 02/12/2020  TSH     0.35 - 4.50 uIU/mL 14.68 (H)  T4,Free(Direct)     0.60 - 1.60 ng/dL 0.71   TSH is quite high.  It is possible that this is due to decreased absorption in the setting of Nexium use.  We will increase the dose to 137 mcg daily and have her back for labs in 1.5 months.  Philemon Kingdom, MD PhD [facility]

## 2020-02-12 NOTE — Patient Instructions (Signed)
Please continue Synthroid 125 mcg daily.  Take the thyroid hormone every day, with water, at least 30 minutes before breakfast, separated by at least 4 hours from: - acid reflux medications - calcium - iron - multivitamins  Please stop at the lab.  Please come back for a follow-up appointment in 1 year.

## 2020-02-13 ENCOUNTER — Other Ambulatory Visit: Payer: Self-pay | Admitting: Internal Medicine

## 2020-02-13 ENCOUNTER — Encounter: Payer: Self-pay | Admitting: Internal Medicine

## 2020-02-13 MED ORDER — SYNTHROID 137 MCG PO TABS: 137.0000 ug | ORAL_TABLET | Freq: Every day | ORAL | 3 refills | Status: DC

## 2020-02-13 MED ORDER — SYNTHROID 125 MCG PO TABS: 125.0000 ug | ORAL_TABLET | Freq: Every day | ORAL | 3 refills | Status: DC

## 2020-02-14 ENCOUNTER — Other Ambulatory Visit: Payer: Self-pay | Admitting: Family Medicine

## 2020-02-14 DIAGNOSIS — Z1231 Encounter for screening mammogram for malignant neoplasm of breast: Secondary | ICD-10-CM

## 2020-02-18 MED FILL — SYNTHROID 125 MCG TABLET: 125 | 30 days supply | Qty: 30 | Fill #0

## 2020-02-26 ENCOUNTER — Other Ambulatory Visit: Payer: Self-pay | Admitting: Family Medicine

## 2020-02-26 MED FILL — SERTRALINE HCL 50 MG TABLET: 50 | 90 days supply | Qty: 90 | Fill #0

## 2020-03-03 MED FILL — BREZTRI AEROSPHERE 160-9-4.: 160-9-4.8 | 30 days supply | Qty: 11 | Fill #3

## 2020-03-06 MED FILL — ROSUVASTATIN CALCIUM 5 MG T: 5 | 90 days supply | Qty: 90 | Fill #3

## 2020-03-17 MED FILL — MONTELUKAST SOD 10 MG TAB: 10 | 90 days supply | Qty: 90 | Fill #2

## 2020-03-18 ENCOUNTER — Ambulatory Visit: Payer: No Typology Code available for payment source | Admitting: Family Medicine

## 2020-03-24 ENCOUNTER — Ambulatory Visit: Payer: No Typology Code available for payment source

## 2020-03-28 ENCOUNTER — Other Ambulatory Visit (HOSPITAL_COMMUNITY): Payer: Self-pay

## 2020-03-28 MED FILL — IBUPROFEN 800 MG TAB: 800 | 7 days supply | Qty: 21 | Fill #0

## 2020-03-28 MED FILL — CHLORHEXIDINE 0.12% RINSE: 0.12 | 14 days supply | Qty: 473 | Fill #0

## 2020-03-28 MED FILL — DEXAMETHASONE 4 MG TABLET: 4 | 3 days supply | Qty: 6 | Fill #0

## 2020-03-31 MED FILL — BREZTRI AEROSPHERE 160-9-4.: 160-9-4.8 | 30 days supply | Qty: 11 | Fill #4

## 2020-03-31 MED FILL — SYNTHROID 125 MCG TABLET: 125 | 30 days supply | Qty: 30 | Fill #1

## 2020-04-02 ENCOUNTER — Other Ambulatory Visit: Payer: Self-pay

## 2020-04-02 ENCOUNTER — Other Ambulatory Visit (INDEPENDENT_AMBULATORY_CARE_PROVIDER_SITE_OTHER): Payer: No Typology Code available for payment source

## 2020-04-02 DIAGNOSIS — E039 Hypothyroidism, unspecified: Secondary | ICD-10-CM

## 2020-04-02 LAB — T4, FREE: Free T4: 0.84 ng/dL (ref 0.60–1.60)

## 2020-04-02 LAB — TSH: TSH: 6.48 u[IU]/mL — ABNORMAL HIGH (ref 0.35–4.50)

## 2020-04-03 ENCOUNTER — Other Ambulatory Visit: Payer: Self-pay | Admitting: Internal Medicine

## 2020-04-03 DIAGNOSIS — E039 Hypothyroidism, unspecified: Secondary | ICD-10-CM

## 2020-04-03 MED ORDER — SYNTHROID 137 MCG PO TABS: 137.0000 ug | ORAL_TABLET | Freq: Every day | ORAL | 3 refills | Status: DC

## 2020-04-03 MED FILL — SYNTHROID 137 MCG TABLET: 137 | 45 days supply | Qty: 45 | Fill #0

## 2020-04-06 MED FILL — SYNTHROID 137 MCG TABLET: 137 | 60 days supply | Qty: 60 | Fill #0

## 2020-04-20 MED FILL — ZOLPIDEM TARTRATE 5 MG TABS: 5 | 90 days supply | Qty: 90 | Fill #1

## 2020-04-23 ENCOUNTER — Ambulatory Visit: Payer: No Typology Code available for payment source | Admitting: Family Medicine

## 2020-04-28 MED FILL — BREZTRI AEROSPHERE 160-9-4.: 160-9-4.8 | 30 days supply | Qty: 11 | Fill #5

## 2020-05-20 ENCOUNTER — Other Ambulatory Visit: Payer: Self-pay

## 2020-05-20 ENCOUNTER — Other Ambulatory Visit (INDEPENDENT_AMBULATORY_CARE_PROVIDER_SITE_OTHER): Payer: 59

## 2020-05-20 DIAGNOSIS — E039 Hypothyroidism, unspecified: Secondary | ICD-10-CM

## 2020-05-20 LAB — T4, FREE: Free T4: 1.01 ng/dL (ref 0.60–1.60)

## 2020-05-20 LAB — TSH: TSH: 0.62 u[IU]/mL (ref 0.35–4.50)

## 2020-05-26 ENCOUNTER — Other Ambulatory Visit: Payer: Self-pay | Admitting: Family Medicine

## 2020-05-26 MED FILL — BREZTRI AEROSPHERE 160-9-4.: 160-9-4.8 | 30 days supply | Qty: 11 | Fill #0

## 2020-05-27 ENCOUNTER — Ambulatory Visit: Payer: No Typology Code available for payment source | Admitting: Family Medicine

## 2020-05-28 MED FILL — SERTRALINE HCL 50 MG TABS: 50 | 90 days supply | Qty: 90 | Fill #1

## 2020-06-02 MED FILL — SYNTHROID 137 MCG TABLET: 137 | 60 days supply | Qty: 60 | Fill #1

## 2020-06-09 ENCOUNTER — Other Ambulatory Visit: Payer: Self-pay | Admitting: Family Medicine

## 2020-06-09 DIAGNOSIS — E782 Mixed hyperlipidemia: Secondary | ICD-10-CM

## 2020-06-09 MED FILL — ESOMEPRAZOLE MAGNESIUM 20 M: 20 | 90 days supply | Qty: 90 | Fill #3

## 2020-06-10 MED FILL — ROSUVASTATIN CALCIUM 5 MG T: 5 | 90 days supply | Qty: 90 | Fill #0

## 2020-06-17 ENCOUNTER — Other Ambulatory Visit: Payer: Self-pay | Admitting: Family Medicine

## 2020-06-17 DIAGNOSIS — J301 Allergic rhinitis due to pollen: Secondary | ICD-10-CM

## 2020-06-19 ENCOUNTER — Other Ambulatory Visit: Payer: Self-pay

## 2020-06-19 ENCOUNTER — Other Ambulatory Visit: Payer: Self-pay | Admitting: Family Medicine

## 2020-06-19 ENCOUNTER — Ambulatory Visit: Payer: 59 | Admitting: Family Medicine

## 2020-06-19 ENCOUNTER — Encounter: Payer: Self-pay | Admitting: Family Medicine

## 2020-06-19 VITALS — BP 118/78 | HR 74 | Temp 97.8°F | Resp 16 | Ht 63.0 in | Wt 210.0 lb

## 2020-06-19 DIAGNOSIS — Z23 Encounter for immunization: Secondary | ICD-10-CM | POA: Diagnosis not present

## 2020-06-19 DIAGNOSIS — E782 Mixed hyperlipidemia: Secondary | ICD-10-CM

## 2020-06-19 DIAGNOSIS — Z0000 Encounter for general adult medical examination without abnormal findings: Secondary | ICD-10-CM | POA: Diagnosis not present

## 2020-06-19 DIAGNOSIS — J4530 Mild persistent asthma, uncomplicated: Secondary | ICD-10-CM | POA: Diagnosis not present

## 2020-06-19 DIAGNOSIS — F4323 Adjustment disorder with mixed anxiety and depressed mood: Secondary | ICD-10-CM | POA: Diagnosis not present

## 2020-06-19 DIAGNOSIS — E669 Obesity, unspecified: Secondary | ICD-10-CM | POA: Diagnosis not present

## 2020-06-19 DIAGNOSIS — D751 Secondary polycythemia: Secondary | ICD-10-CM | POA: Diagnosis not present

## 2020-06-19 DIAGNOSIS — E039 Hypothyroidism, unspecified: Secondary | ICD-10-CM

## 2020-06-19 DIAGNOSIS — R600 Localized edema: Secondary | ICD-10-CM

## 2020-06-19 DIAGNOSIS — J432 Centrilobular emphysema: Secondary | ICD-10-CM

## 2020-06-19 LAB — LIPID PANEL
Cholesterol: 182 mg/dL (ref 0–200)
HDL: 64.8 mg/dL (ref >39.00)
LDL Cholesterol: 82 mg/dL (ref 0–99)
NonHDL: 117.35
Total CHOL/HDL Ratio: 3
Triglycerides: 175 mg/dL — ABNORMAL HIGH (ref 0.0–149.0)
VLDL: 35 mg/dL (ref 0.0–40.0)

## 2020-06-19 LAB — COMPREHENSIVE METABOLIC PANEL
ALT: 29 U/L (ref 0–35)
AST: 29 U/L (ref 0–37)
Albumin: 4.1 g/dL (ref 3.5–5.2)
Alkaline Phosphatase: 76 U/L (ref 39–117)
BUN: 13 mg/dL (ref 6–23)
CO2: 28 mEq/L (ref 19–32)
Calcium: 8.9 mg/dL (ref 8.4–10.5)
Chloride: 104 mEq/L (ref 96–112)
Creatinine, Ser: 1.03 mg/dL (ref 0.40–1.20)
GFR: 58.13 mL/min — ABNORMAL LOW (ref >60.00)
Glucose, Bld: 105 mg/dL — ABNORMAL HIGH (ref 70–99)
Potassium: 4 mEq/L (ref 3.5–5.1)
Sodium: 138 mEq/L (ref 135–145)
Total Bilirubin: 0.6 mg/dL (ref 0.2–1.2)
Total Protein: 6.6 g/dL (ref 6.0–8.3)

## 2020-06-19 LAB — CBC WITH DIFFERENTIAL/PLATELET
Basophils Absolute: 0 10*3/uL (ref 0.0–0.1)
Basophils Relative: 0.5 % (ref 0.0–3.0)
Eosinophils Absolute: 0.2 10*3/uL (ref 0.0–0.7)
Eosinophils Relative: 2.7 % (ref 0.0–5.0)
HCT: 42.7 % (ref 36.0–46.0)
Hemoglobin: 14.6 g/dL (ref 12.0–15.0)
Lymphocytes Relative: 16.7 % (ref 12.0–46.0)
Lymphs Abs: 1.2 10*3/uL (ref 0.7–4.0)
MCHC: 34.1 g/dL (ref 30.0–36.0)
MCV: 95.4 fl (ref 78.0–100.0)
Monocytes Absolute: 0.7 10*3/uL (ref 0.1–1.0)
Monocytes Relative: 10.3 % (ref 3.0–12.0)
Neutro Abs: 4.8 10*3/uL (ref 1.4–7.7)
Neutrophils Relative %: 69.8 % (ref 43.0–77.0)
Platelets: 246 10*3/uL (ref 150.0–400.0)
RBC: 4.48 Mil/uL (ref 3.87–5.11)
RDW: 14.1 % (ref 11.5–15.5)
WBC: 6.9 10*3/uL (ref 4.0–10.5)

## 2020-06-19 MED ORDER — ESOMEPRAZOLE MAGNESIUM 20 MG PO CPDR: 20.0000 mg | DELAYED_RELEASE_CAPSULE | Freq: Every day | ORAL | 3 refills | Status: DC

## 2020-06-19 MED ORDER — POTASSIUM CHLORIDE CRYS ER 10 MEQ PO TBCR: 10.0000 meq | EXTENDED_RELEASE_TABLET | Freq: Every day | ORAL | Status: DC | PRN

## 2020-06-19 MED ORDER — BREZTRI AEROSPHERE 160-9-4.8 MCG/ACT IN AERO
INHALATION_SPRAY | RESPIRATORY_TRACT | 11 refills | Status: DC
Start: 2020-06-19 — End: 2020-06-19

## 2020-06-19 MED ORDER — FUROSEMIDE 20 MG PO TABS: 20.0000 mg | ORAL_TABLET | Freq: Every day | ORAL | Status: DC

## 2020-06-19 MED FILL — MONTELUKAST SOD 10 MG TAB: 10 | 90 days supply | Qty: 90 | Fill #0

## 2020-06-19 NOTE — Progress Notes (Signed)
Subjective  Chief Complaint  Patient presents with  . Emphysema    Switched from Symbicort to [place] at last visit, states that she has noticed an improvement  . Health Maintenance    Aware that she needs to schedule her mammogram, ordered at last visit     HPI: Susan Burch is a 65 y.o. female who presents to [facility] [place] today for a Female Wellness Visit. She also has the concerns and/or needs as listed above in the chief complaint. These will be addressed in addition to the Health Maintenance Visit.   Wellness Visit: annual visit with health maintenance review and exam without Pap   Health maintenance: Overdue for mammogram.  Patient will schedule.  Unfortunately her husband had a stroke last month and she has been busy caring for him.  She understands the importance.  Pap smear was normal and up-to-date.  Colon cancer screening is up-to-date.  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  She is due for her second Shingrix vaccination.  Other immunizations are up-to-date.   Chronic disease f/u and/or acute problem visit: (deemed necessary to be done in addition to the wellness visit):  Emphysema and asthma follow-up: Back in August she was having troubles with dyspnea on exertion.  We switched from Symbicort to [place].  Fortunately, she has responded well.  Peak flows prior to the change were in the low 300s.  Now she averages 400-450.  She feels less winded.  No longer extreme dyspnea on exertion.  Occasionally she will get tired at work and have to sit down but this is more fatigue related to the high intensity requirements of the job.  No chest pain.  She denies GERD symptoms.  She is on chronic PPI.  Hyperlipidemia on low-dose Crestor.  She tolerates this well.  She is nonfasting today for recheck.  Depression: PHQ 2 is 0.  She feels this is well controlled.  She is on long-term Zoloft.  No adverse effects.  History of mild  polycythemia  Hypothyroidism: Reviewed recent endocrinology notes and lab work.  Well-controlled.  Rare lower extremity edema.  Rare Lasix use.  Assessment  1. Annual physical exam   2. Centrilobular emphysema (HCC) Chronic  3. Mixed hyperlipidemia   4. Situational mixed anxiety and depressive disorder   5. Polycythemia, mild, evaluation by Dr Irene Limbo 2020   6. Obesity (BMI 30-39.9)   7. Hypothyroidism (acquired)   8. Mild persistent asthma without complication   9. Bilateral lower extremity edema      Plan  Female Wellness Visit:  Age appropriate Health Maintenance and Prevention measures were discussed with patient. Included topics are cancer screening recommendations, ways to keep healthy (see AVS) including dietary and exercise recommendations, regular eye and dental care, use of seat belts, and avoidance of moderate alcohol use and tobacco use.  Patient to schedule mammogram  BMI: discussed patient's BMI and encouraged positive lifestyle modifications to help get to or maintain a target BMI.  HM needs and immunizations were addressed and ordered. See below for orders. See HM and immunization section for updates.  Second Shingrix updated today  Routine labs and screening tests ordered including cmp, cbc and lipids where appropriate.  Discussed recommendations regarding Vit D and calcium supplementation (see AVS)  Chronic disease management visit and/or acute problem visit:  Emphysema and asthma: Now well controlled.  Rare albuterol use.  Peak flows are excellent.  Symptomatically she is controlled.  Continue  Breztri twice daily  Hyperlipidemia: Recheck lipids today and LFTs.  Crestor 5 mg nightly  Depression is well controlled on long-term Zoloft.  No changes made today.  Hypothyroidism and GERD are well controlled continue current medications, omeprazole and levothyroxine.  Monitor electrolytes.   Follow up: 12 months for complete physical Orders Placed This Encounter   Procedures  . CBC with Differential/Platelet  . Comprehensive metabolic panel  . Lipid panel   Meds ordered this encounter  Medications  . Budeson-Glycopyrrol-Formoterol (BREZTRI AEROSPHERE) 160-9-4.8 MCG/ACT AERO    Sig: INHALE 2 PUFFS BY MOUTH INTO LUNGS IN THE MORNING AND AT BEDTIME    Dispense:  10.7 g    Refill:  11  . esomeprazole (NEXIUM) 20 MG capsule    Sig: Take 1 capsule (20 mg total) by mouth daily.    Dispense:  90 capsule    Refill:  3  . furosemide (LASIX) 20 MG tablet    Sig: Take 1 tablet (20 mg total) by mouth daily.  . potassium chloride (KLOR-CON) 10 MEQ tablet    Sig: Take 1 tablet (10 mEq total) by mouth daily as needed. With lasix      Body mass index is 37.2 kg/m. Wt Readings from Last 3 Encounters:  06/19/20 210 lb (95.3 kg)  02/12/20 217 lb (98.4 kg)  12/11/19 213 lb 6.4 oz (96.8 kg)     Patient Active Problem List   Diagnosis Date Noted  . Centrilobular emphysema ([place]) 12/11/2019    Priority: High    Mild noted on CT for lung cancer screen 2021; former long term smoker.   . Obesity (BMI 30-39.9) 06/12/2019    Priority: High  . Mixed hyperlipidemia 02/20/2019    Priority: High  . Situational mixed anxiety and depressive disorder 11/12/2018    Priority: High  . Polycythemia, mild, evaluation by Dr Irene Limbo 2020 09/05/2018    Priority: High    Evaluation by Dr. Martin Majestic.  -Discussed patient's most recent labs from 06/05/18, HGB at 16.2 and HCT at 47.8. Other blood counts normal. GFR slightly low at 53.31, other chemistries are normal. -Reviewed previous labs; earliest available HGB from 05/26/15 was at 15.6 with a HCT of 46.3. 09/21/16 HGB at 15.5. 05/30/17 HGB at 15.6. -Discussed that the patient's polycythemia is borderline, and after looking at the trend over the last 3.5 years, she has not had progression nor other blood count abnormalities. Do not suspect primary bone marrow disorder such as polycythemia vera. -Discussed that her mild  polycythemia is likely secondary from hemo-concentration from diuretics vs lower oxygen levels due to asthma vs lower oxygen levels due to sleep apnea vs fluctuating thyroid levels -Recommend pursuing sleep study with PCP to rule out sleep apnea, which could be a cause of mild polycythemia, and pt does endorse snoring   . Bilateral lower extremity edema 04/11/2018    Priority: High    Echocardiogram 12/9240: mild diastolic dysfunction; nl EF. Mild LVH, prn lasix after long shifts at work   . Sleep initiation dysfunction 09/27/2011    Priority: High  . Hypothyroidism (acquired) 11/06/2006    Priority: High    Managed by Dr. Cruzita Lederer    . Former smoker, 32 pack year history, quit 2006 11/12/2018    Priority: Medium  . Episodic tension-type headache, not intractable 11/12/2018    Priority: Medium  . Mild persistent asthma 09/21/2016    Priority: Medium    Has seen Dr. Donneta Romberg in the past   . Postmenopausal disorder 02/14/2013  Priority: Medium  . GERD 11/06/2006    Priority: Medium  . Allergic rhinitis 11/06/2006    Priority: Low   Health Maintenance  Topic Date Due  . MAMMOGRAM  11/21/2019  . TETANUS/TDAP  04/25/2021  . PAP SMEAR-Modifier  06/06/2023  . COLONOSCOPY (Pts 45-92yrs Insurance coverage will need to be confirmed)  11/15/2028  . INFLUENZA VACCINE  Completed  . COVID-19 Vaccine  Completed  . Hepatitis C Screening  Completed  . HIV Screening  Completed   Immunization History  Administered Date(s) Administered  . Influenza, Quadrivalent, Recombinant, Inj, Pf 01/21/2019  . Influenza-Unspecified 01/23/2014, 12/25/2014, 01/23/2018, 01/24/2020  . PFIZER(Purple Top)SARS-COV-2 Vaccination 04/20/2019, 05/09/2019, 02/01/2020  . Pneumococcal Conjugate-13 11/28/2006  . Pneumococcal Polysaccharide-23 02/14/2013  . Td 07/03/2006  . Zoster Recombinat (Shingrix) 01/21/2019   We updated and reviewed the patient's past history in detail and it is documented  below. Allergies: Patient is allergic to bactrim and penicillins. Past Medical History Patient  has a past medical history of Centrilobular emphysema ([place]) (12/11/2019), GERD without esophagitis, HLD (hyperlipidemia), Hypothyroidism, Mixed hyperlipidemia (02/20/2019), and Sleep initiation dysfunction (09/27/2011). Past Surgical History Patient  has a past surgical history that includes Breast excisional biopsy (Right, 1990); Myomectomy (1993); Cholecystectomy (1998); and Cesarean section (1994, 1996). Family History: Patient family history includes Atrial fibrillation in her maternal uncle; Breast cancer in her cousin; Cancer in her cousin and maternal aunt; Diabetes in her brother and maternal grandmother; Heart Problems in her maternal aunt and mother; High Cholesterol in her father; High blood pressure in her father and maternal uncle; Hypothyroidism in her daughter, maternal aunt, maternal uncle, mother, and son. Social History:  Patient  reports that she quit smoking about 16 years ago. Her smoking use included cigarettes. She has a 32.00 pack-year smoking history. She has never used smokeless tobacco. She reports current alcohol use of about 14.0 standard drinks of alcohol per week. She reports that she does not use drugs.  Review of Systems: Constitutional: negative for fever or malaise Ophthalmic: negative for photophobia, double vision or loss of vision Cardiovascular: negative for chest pain, dyspnea on exertion, or new LE swelling Respiratory: negative for SOB or persistent cough Gastrointestinal: negative for abdominal pain, change in bowel habits or melena Genitourinary: negative for dysuria or gross hematuria, no abnormal uterine bleeding or disharge Musculoskeletal: negative for new gait disturbance or muscular weakness Integumentary: negative for new or persistent rashes, no breast lumps Neurological: negative for TIA or stroke symptoms Psychiatric: negative for SI or  delusions Allergic/Immunologic: negative for hives  Patient Care Team    Relationship Specialty Notifications Start End  Leamon Arnt, MD PCP - General Family Medicine  02/20/19   Lyndal Pulley, DO Consulting Physician Family Medicine  04/11/18   Philemon Kingdom, MD Consulting Physician Internal Medicine  04/11/18   Mosetta Anis, MD Referring Physician Allergy  04/11/18   Servando Salina, MD Consulting Physician Obstetrics and Gynecology  04/11/18     Objective  Vitals: BP 118/78   Pulse 74   Temp 97.8 F (36.6 C) (Temporal)   Resp 16   Ht 5\' 3"  (1.6 m)   Wt 210 lb (95.3 kg)   SpO2 96%   BMI 37.20 kg/m  General:  Well developed, well nourished, no acute distress  Psych:  Alert and orientedx3,normal mood and affect HEENT:  Normocephalic, atraumatic, non-icteric sclera,  supple neck without adenopathy, mass or thyromegaly Cardiovascular:  Normal S1, S2, RRR without gallop, rub or murmur Respiratory:  Good breath sounds  bilaterally, CTAB with normal respiratory effort Gastrointestinal: normal bowel sounds, soft, non-tender, no noted masses. No HSM MSK: no deformities, contusions. Joints are without erythema or swelling.  Skin:  Warm, no rashes or suspicious lesions noted Neurologic:    Mental status is normal. CN 2-11 are normal. Gross motor and sensory exams are normal. Normal gait. No tremor    Commons side effects, risks, benefits, and alternatives for medications and treatment plan prescribed today were discussed, and the patient expressed understanding of the given instructions. Patient is instructed to call or message via MyChart if he/she has any questions or concerns regarding our treatment plan. No barriers to understanding were identified. We discussed Red Flag symptoms and signs in detail. Patient expressed understanding regarding what to do in case of urgent or emergency type symptoms.   Medication list was reconciled, printed and provided to the patient in  AVS. Patient instructions and summary information was reviewed with the patient as documented in the AVS. This note was prepared with assistance of Dragon voice recognition software. Occasional wrong-word or sound-a-like substitutions may have occurred due to the inherent limitations of voice recognition software  This visit occurred during the SARS-CoV-2 public health emergency.  Safety protocols were in place, including screening questions prior to the visit, additional usage of staff PPE, and extensive cleaning of exam room while observing appropriate contact time as indicated for disinfecting solutions.

## 2020-06-19 NOTE — Patient Instructions (Signed)
Please return in 12 months for your annual complete physical; please come fasting.  I will release your lab results to you on your MyChart account with further instructions. Please reply with any questions.   Today you were given your 2nd of 2 Shingrx vaccinations. This protects you from developing shingles.   Glad your breathing is improved.  I have refilled your medications.    If you have any questions or concerns, please don't hesitate to send me a message via MyChart or call the office at [phone]. Thank you for visiting with Susan Burch today! It's our pleasure caring for you.   Preventive Care 63-63 Years Old, Female Preventive care refers to lifestyle choices and visits with your health care provider that can promote health and wellness. This includes:  A yearly physical exam. This is also called an annual wellness visit.  Regular dental and eye exams.  Immunizations.  Screening for certain conditions.  Healthy lifestyle choices, such as: ? Eating a healthy diet. ? Getting regular exercise. ? Not using drugs or products that contain nicotine and tobacco. ? Limiting alcohol use. What can I expect for my preventive care visit? Physical exam Your health care provider will check your:  Height and weight. These may be used to calculate your BMI (body mass index). BMI is a measurement that tells if you are at a healthy weight.  Heart rate and blood pressure.  Body temperature.  Skin for abnormal spots. Counseling Your health care provider may ask you questions about your:  Past medical problems.  Family's medical history.  Alcohol, tobacco, and drug use.  Emotional well-being.  Home life and relationship well-being.  Sexual activity.  Diet, exercise, and sleep habits.  Work and work Statistician.  Access to firearms.  Method of birth control.  Menstrual cycle.  Pregnancy history. What immunizations do I need? Vaccines are usually given at various ages,  according to a schedule. Your health care provider will recommend vaccines for you based on your age, medical history, and lifestyle or other factors, such as travel or where you work.   What tests do I need? Blood tests  Lipid and cholesterol levels. These may be checked every 5 years, or more often if you are over 33 years old.  Hepatitis C test.  Hepatitis B test. Screening  Lung cancer screening. You may have this screening every year starting at age 63 if you have a 30-pack-year history of smoking and currently smoke or have quit within the past 15 years.  Colorectal cancer screening. ? All adults should have this screening starting at age 27 and continuing until age 63. ? Your health care provider may recommend screening at age 63 if you are at increased risk. ? You will have tests every 1-10 years, depending on your results and the type of screening test.  Diabetes screening. ? This is done by checking your blood sugar (glucose) after you have not eaten for a while (fasting). ? You may have this done every 1-3 years.  Mammogram. ? This may be done every 1-2 years. ? Talk with your health care provider about when you should start having regular mammograms. This may depend on whether you have a family history of breast cancer.  BRCA-related cancer screening. This may be done if you have a family history of breast, ovarian, tubal, or peritoneal cancers.  Pelvic exam and Pap test. ? This may be done every 3 years starting at age 63. ? Starting at age 63, this 50  be done every 5 years if you have a Pap test in combination with an HPV test. Other tests  STD (sexually transmitted disease) testing, if you are at risk.  Bone density scan. This is done to screen for osteoporosis. You may have this scan if you are at high risk for osteoporosis. Talk with your health care provider about your test results, treatment options, and if necessary, the need for more tests. Follow these  instructions at home: Eating and drinking  Eat a diet that includes fresh fruits and vegetables, whole grains, lean protein, and low-fat dairy products.  Take vitamin and mineral supplements as recommended by your health care provider.  Do not drink alcohol if: ? Your health care provider tells you not to drink. ? You are pregnant, may be pregnant, or are planning to become pregnant.  If you drink alcohol: ? Limit how much you have to 0-1 drink a day. ? Be aware of how much alcohol is in your drink. In the [place] one drink equals one 12 oz bottle of beer (355 mL), one 5 oz glass of wine (148 mL), or one 1 oz glass of hard liquor (44 mL).   Lifestyle  Take daily care of your teeth and gums. Brush your teeth every morning and night with fluoride toothpaste. Floss one time each day.  Stay active. Exercise for at least 30 minutes 5 or more days each week.  Do not use any products that contain nicotine or tobacco, such as cigarettes, e-cigarettes, and chewing tobacco. If you need help quitting, ask your health care provider.  Do not use drugs.  If you are sexually active, practice safe sex. Use a condom or other form of protection to prevent STIs (sexually transmitted infections).  If you do not wish to become pregnant, use a form of birth control. If you plan to become pregnant, see your health care provider for a prepregnancy visit.  If told by your health care provider, take low-dose aspirin daily starting at age 63.  Find healthy ways to cope with stress, such as: ? Meditation, yoga, or listening to music. ? Journaling. ? Talking to a trusted person. ? Spending time with friends and family. Safety  Always wear your seat belt while driving or riding in a vehicle.  Do not drive: ? If you have been drinking alcohol. Do not ride with someone who has been drinking. ? When you are tired or distracted. ? While texting.  Wear a helmet and other protective equipment during sports  activities.  If you have firearms in your house, make sure you follow all gun safety procedures. What's next?  Visit your health care provider once a year for an annual wellness visit.  Ask your health care provider how often you should have your eyes and teeth checked.  Stay up to date on all vaccines. This information is not intended to replace advice given to you by your health care provider. Make sure you discuss any questions you have with your health care provider. Document Revised: 01/14/2020 Document Reviewed: 12/21/2017 Elsevier Patient Education  2021 [employer].

## 2020-06-19 NOTE — Addendum Note (Signed)
Addended by: Doran Clay A on: 06/19/2020 10:26 AM   Modules accepted: Orders

## 2020-06-23 MED FILL — BREZTRI AEROSPHERE 160-9-4.: 160-9-4.8 | 30 days supply | Qty: 11 | Fill #0

## 2020-07-13 ENCOUNTER — Other Ambulatory Visit: Payer: Self-pay

## 2020-07-13 DIAGNOSIS — G4700 Insomnia, unspecified: Secondary | ICD-10-CM

## 2020-07-13 NOTE — Telephone Encounter (Signed)
Last refill: 01/16/20 #90, 3 Last OV: 06/19/20 dx. CPE

## 2020-07-14 ENCOUNTER — Other Ambulatory Visit (HOSPITAL_BASED_OUTPATIENT_CLINIC_OR_DEPARTMENT_OTHER): Payer: Self-pay

## 2020-07-14 MED ORDER — ZOLPIDEM TARTRATE 5 MG PO TABS: 5.0000 mg | ORAL_TABLET | Freq: Every evening | ORAL | 1 refills | Status: DC | PRN

## 2020-07-14 NOTE — Telephone Encounter (Signed)
Please fax

## 2020-07-17 ENCOUNTER — Other Ambulatory Visit: Payer: Self-pay | Admitting: Family Medicine

## 2020-07-17 DIAGNOSIS — G4700 Insomnia, unspecified: Secondary | ICD-10-CM

## 2020-07-17 MED FILL — ZOLPIDEM TARTRATE 5 MG TABS: 5 | 90 days supply | Qty: 90 | Fill #0

## 2020-07-21 MED FILL — BREZTRI AEROSPHERE 160-9-4.: 160-9-4.8 | 30 days supply | Qty: 11 | Fill #1

## 2020-07-30 ENCOUNTER — Other Ambulatory Visit (HOSPITAL_COMMUNITY): Payer: Self-pay

## 2020-07-30 MED FILL — Levothyroxine Sodium Tab 137 MCG: ORAL | 60 days supply | Qty: 60 | Fill #0 | Status: AC

## 2020-08-03 ENCOUNTER — Other Ambulatory Visit (HOSPITAL_COMMUNITY): Payer: Self-pay

## 2020-08-18 ENCOUNTER — Other Ambulatory Visit (HOSPITAL_COMMUNITY): Payer: Self-pay

## 2020-08-18 MED FILL — Budesonide-Glycopyrrolate-Formoterol Aers 160-9-4.8 MCG/ACT: RESPIRATORY_TRACT | 30 days supply | Qty: 10.7 | Fill #0 | Status: AC

## 2020-08-27 ENCOUNTER — Other Ambulatory Visit (HOSPITAL_COMMUNITY): Payer: Self-pay

## 2020-08-27 MED FILL — Rosuvastatin Calcium Tab 5 MG: ORAL | 90 days supply | Qty: 90 | Fill #0 | Status: AC

## 2020-08-27 MED FILL — Sertraline HCl Tab 50 MG: ORAL | 90 days supply | Qty: 90 | Fill #0 | Status: AC

## 2020-09-01 ENCOUNTER — Ambulatory Visit: Payer: 59 | Admitting: Podiatry

## 2020-09-01 ENCOUNTER — Encounter: Payer: Self-pay | Admitting: Podiatry

## 2020-09-01 ENCOUNTER — Other Ambulatory Visit (HOSPITAL_COMMUNITY): Payer: Self-pay

## 2020-09-01 ENCOUNTER — Ambulatory Visit (INDEPENDENT_AMBULATORY_CARE_PROVIDER_SITE_OTHER): Payer: 59

## 2020-09-01 ENCOUNTER — Other Ambulatory Visit: Payer: Self-pay

## 2020-09-01 ENCOUNTER — Other Ambulatory Visit: Payer: Self-pay | Admitting: Podiatry

## 2020-09-01 DIAGNOSIS — M722 Plantar fascial fibromatosis: Secondary | ICD-10-CM

## 2020-09-01 MED ORDER — METHYLPREDNISOLONE 4 MG PO TBPK
ORAL_TABLET | ORAL | 0 refills | Status: DC
  Filled 2020-09-01: qty 21, 6d supply, fill #0

## 2020-09-01 MED ORDER — MELOXICAM 15 MG PO TABS
15.0000 mg | ORAL_TABLET | Freq: Every day | ORAL | 3 refills | Status: DC
  Filled 2020-09-01: qty 30, 30d supply, fill #0

## 2020-09-01 MED ORDER — TRIAMCINOLONE ACETONIDE 40 MG/ML IJ SUSP
20.0000 mg | Freq: Once | INTRAMUSCULAR | Status: AC
  Administered 2020-09-01: 20 mg

## 2020-09-01 NOTE — Patient Instructions (Signed)

## 2020-09-01 NOTE — Progress Notes (Signed)
Subjective:  Patient ID: Susan Burch, female    DOB: June 26, 1955,  MRN: [id number] HPI Chief Complaint  Patient presents with  . 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 New Patient (Initial Visit)    65 y.o. female presents with the above complaint.   ROS: Denies fever chills nausea vomiting muscle aches pains calf pain back pain chest pain shortness of breath.  Past Medical History:  Diagnosis Date  . Centrilobular emphysema ([place]) 12/11/2019   Mild noted on CT for lung cancer screen 2021; former long term smoker.  [place]land Kitchen GERD without esophagitis   . HLD (hyperlipidemia)   . Hypothyroidism   . Mixed hyperlipidemia 02/20/2019  . Sleep initiation dysfunction 09/27/2011   Past Surgical History:  Procedure Laterality Date  . BREAST EXCISIONAL BIOPSY Right 1990  . [place]  . CHOLECYSTECTOMY  1998  . MYOMECTOMY  1993    Current Outpatient Medications:  .  meloxicam (MOBIC) 15 MG tablet, Take 1 tablet (15 mg total) by mouth daily., Disp: 30 tablet, Rfl: 3 .  methylPREDNISolone (MEDROL DOSEPAK) 4 MG TBPK tablet, TAKE AS DIRECTED PER PACKAGE INSTRUCTIONS FOR 6 DAYS, Disp: 21 tablet, Rfl: 0 .  albuterol (VENTOLIN HFA) 108 (90 Base) MCG/ACT inhaler, INHALE 2 PUFFS BY MOUTH INTO LUNGS EVERY 6 HOURS AS NEEDED, Disp: 18 g, Rfl: 1 .  Ascorbic Acid (VITAMIN C) 500 MG CAPS, Take by mouth., Disp: , Rfl:  .  Budeson-Glycopyrrol-Formoterol 160-9-4.8 MCG/ACT AERO, INHALE 2 PUFFS BY MOUTH INTO LUNGS IN THE MORNING AND AT BEDTIME, Disp: 10.7 g, Rfl: 11 .  chlorhexidine (PERIDEX) 0.12 % solution, RINSE MOUTH WITH 15ML (1 CAPFUL) FOR 30 SECONDS AM AND PM AFTER TOOTHBRUSHING. EXPECTORATE AFTER RINSING, DO NOT SWALLOW, Disp: 473 mL, Rfl: 0 .  dexamethasone (DECADRON)  4 MG tablet, TAKE ONE TABLET BY MOUTH EVERY 12 HOURS, Disp: 6 tablet, Rfl: 0 .  esomeprazole (NEXIUM) 20 MG capsule, TAKE 1 CAPSULE BY MOUTH DAILY, Disp: 90 capsule, Rfl: 3 .  furosemide (LASIX) 20 MG tablet, Take 1 tablet (20 mg total) by mouth daily., Disp: , Rfl:  .  ibuprofen (ADVIL) 800 MG tablet, TAKE 1 TABLET EVERY 8 HOURS WITH FOOD AS NEEDED., Disp: 21 tablet, Rfl: 0 .  montelukast (SINGULAIR) 10 MG tablet, TAKE 1 TABLET BY MOUTH AT BEDTIME., Disp: 90 tablet, Rfl: 3 .  Multiple Vitamin (MULTIVITAMIN) tablet, Take 1 tablet by mouth daily., Disp: , Rfl:  .  Peak Flow Meter DEVI, Use as needed to check your breathing. Log your values, Disp: 1 each, Rfl: 0 .  potassium chloride (KLOR-CON) 10 MEQ tablet, Take 1 tablet (10 mEq total) by mouth daily as needed. With lasix, Disp: , Rfl:  .  rosuvastatin (CRESTOR) 5 MG tablet, TAKE 1 TABLET (5 MG TOTAL) BY MOUTH DAILY., Disp: 90 tablet, Rfl: 3 .  sertraline (ZOLOFT) 50 MG tablet, TAKE 1 TABLET BY MOUTH AT BEDTIME, Disp: 90 tablet, Rfl: 3 .  SYNTHROID 137 MCG tablet, TAKE 1 TABLET BY MOUTH ONCE A DAY BEFORE BREAKFAST, Disp: 45 tablet, Rfl: 3 .  Vitamin D, Cholecalciferol, 50 MCG (2000 UT) CAPS, Take by mouth., Disp: , Rfl:  .  Zinc 50 MG CAPS, Take by mouth.,  Disp: , Rfl:  .  zolpidem (AMBIEN) 5 MG tablet, TAKE 1 TABLET BY MOUTH AT BEDTIME AS NEEDED, Disp: 90 tablet, Rfl: 1  Allergies  Allergen Reactions  . Bactrim   . Penicillins    Review of Systems Objective:  There were no vitals filed for this visit.  General: Well developed, nourished, in no acute distress, alert and oriented x3   Dermatological: Skin is warm, dry and supple bilateral. Nails x 10 are well maintained; remaining integument appears unremarkable at this time. There are no open sores, no preulcerative lesions, no rash or signs of infection present.  Vascular: Dorsalis Pedis artery and Posterior Tibial artery pedal pulses are 2/4 bilateral with immedate capillary fill time.  Pedal hair growth present. No varicosities and no lower extremity edema present bilateral.   Neruologic: Grossly intact via light touch bilateral. Vibratory intact via tuning fork bilateral. Protective threshold with Semmes Wienstein monofilament intact to all pedal sites bilateral. Patellar and Achilles deep tendon reflexes 2+ bilateral. No Babinski or clonus noted bilateral.   Musculoskeletal: No gross boney pedal deformities bilateral. No pain, crepitus, or limitation noted with foot and ankle range of motion bilateral. Muscular strength 5/5 in all groups tested bilateral.  She has pain on palpation medial calcaneal tubercle of the left heel.  Gait: Unassisted, Nonantalgic.    Radiographs:  Radiographs taken today demonstrate soft tissue increase in density plantar calcaneal insertion site plantar distally already calcaneal spurs are noted with soft tissue increase in density plantar fashion calcaneal insertion site.  Assessment & Plan:   Assessment: Planter fasciitis left.  Plan: Discussed etiology pathology conservative surgical therapies at this point injected her left heel today 20 mg Kenalog 5 mg Marcaine point maximal tenderness.  Placed her in plantar fascial brace to be followed by a night splint.  Start her on a Medrol Dosepak to be followed by meloxicam.  Follow-up with her in 1 month     Susan Burch, [place]

## 2020-09-03 ENCOUNTER — Other Ambulatory Visit (HOSPITAL_COMMUNITY): Payer: Self-pay

## 2020-09-03 MED ORDER — ALBUTEROL SULFATE HFA 108 (90 BASE) MCG/ACT IN AERS
INHALATION_SPRAY | RESPIRATORY_TRACT | 0 refills | Status: DC
  Filled 2020-09-03: qty 18, 25d supply, fill #0

## 2020-09-15 ENCOUNTER — Other Ambulatory Visit (HOSPITAL_COMMUNITY): Payer: Self-pay

## 2020-09-15 MED FILL — Budesonide-Glycopyrrolate-Formoterol Aers 160-9-4.8 MCG/ACT: RESPIRATORY_TRACT | 30 days supply | Qty: 10.7 | Fill #1 | Status: AC

## 2020-09-22 ENCOUNTER — Other Ambulatory Visit (HOSPITAL_COMMUNITY): Payer: Self-pay

## 2020-09-22 MED FILL — Montelukast Sodium Tab 10 MG (Base Equiv): ORAL | 90 days supply | Qty: 90 | Fill #0 | Status: AC

## 2020-09-29 ENCOUNTER — Other Ambulatory Visit (HOSPITAL_COMMUNITY): Payer: Self-pay

## 2020-09-29 ENCOUNTER — Other Ambulatory Visit: Payer: Self-pay | Admitting: Internal Medicine

## 2020-09-30 MED ORDER — SYNTHROID 137 MCG PO TABS
ORAL_TABLET | ORAL | 0 refills | Status: DC
Start: 2020-09-30 — End: 2020-11-17
  Filled 2020-09-30: qty 45, 45d supply, fill #0

## 2020-10-01 ENCOUNTER — Other Ambulatory Visit (HOSPITAL_COMMUNITY): Payer: Self-pay

## 2020-10-02 ENCOUNTER — Other Ambulatory Visit (HOSPITAL_COMMUNITY): Payer: Self-pay

## 2020-10-13 ENCOUNTER — Other Ambulatory Visit (HOSPITAL_COMMUNITY): Payer: Self-pay

## 2020-10-13 MED FILL — Budesonide-Glycopyrrolate-Formoterol Aers 160-9-4.8 MCG/ACT: RESPIRATORY_TRACT | 30 days supply | Qty: 10.7 | Fill #2 | Status: AC

## 2020-10-15 ENCOUNTER — Ambulatory Visit: Payer: 59 | Admitting: Podiatry

## 2020-10-15 ENCOUNTER — Other Ambulatory Visit (HOSPITAL_COMMUNITY): Payer: Self-pay

## 2020-10-15 MED FILL — Zolpidem Tartrate Tab 5 MG: ORAL | 90 days supply | Qty: 90 | Fill #0 | Status: AC

## 2020-10-28 ENCOUNTER — Other Ambulatory Visit (HOSPITAL_COMMUNITY): Payer: Self-pay

## 2020-10-29 DIAGNOSIS — L400 Psoriasis vulgaris: Secondary | ICD-10-CM | POA: Diagnosis not present

## 2020-10-29 DIAGNOSIS — L299 Pruritus, unspecified: Secondary | ICD-10-CM | POA: Diagnosis not present

## 2020-10-30 ENCOUNTER — Ambulatory Visit: Payer: 59

## 2020-11-03 ENCOUNTER — Other Ambulatory Visit (HOSPITAL_COMMUNITY): Payer: Self-pay

## 2020-11-03 MED FILL — Esomeprazole Magnesium Cap Delayed Release 20 MG (Base Eq): ORAL | 90 days supply | Qty: 90 | Fill #0 | Status: AC

## 2020-11-10 ENCOUNTER — Other Ambulatory Visit (HOSPITAL_COMMUNITY): Payer: Self-pay

## 2020-11-10 MED FILL — Budesonide-Glycopyrrolate-Formoterol Aers 160-9-4.8 MCG/ACT: RESPIRATORY_TRACT | 30 days supply | Qty: 10.7 | Fill #3 | Status: AC

## 2020-11-11 ENCOUNTER — Other Ambulatory Visit (HOSPITAL_COMMUNITY): Payer: Self-pay

## 2020-11-17 ENCOUNTER — Other Ambulatory Visit (HOSPITAL_COMMUNITY): Payer: Self-pay

## 2020-11-17 ENCOUNTER — Other Ambulatory Visit: Payer: Self-pay | Admitting: Internal Medicine

## 2020-11-17 MED ORDER — SYNTHROID 137 MCG PO TABS
ORAL_TABLET | ORAL | 0 refills | Status: DC
  Filled 2020-11-17: qty 90, 90d supply, fill #0

## 2020-11-23 ENCOUNTER — Other Ambulatory Visit: Payer: Self-pay | Admitting: *Deleted

## 2020-11-25 ENCOUNTER — Other Ambulatory Visit (HOSPITAL_COMMUNITY): Payer: Self-pay

## 2020-11-25 MED FILL — Sertraline HCl Tab 50 MG: ORAL | 90 days supply | Qty: 90 | Fill #1 | Status: AC

## 2020-11-27 ENCOUNTER — Other Ambulatory Visit (HOSPITAL_COMMUNITY): Payer: Self-pay

## 2020-11-27 MED FILL — Rosuvastatin Calcium Tab 5 MG: ORAL | 90 days supply | Qty: 90 | Fill #1 | Status: AC

## 2020-11-30 NOTE — Progress Notes (Signed)
[name] [place] Phone: [phone] Subjective:    Susan Burch, am serving as a scribe for Dr. Hulan Saas.   This visit occurred during the SARS-CoV-2 public health emergency.  Safety protocols were in place, including screening questions prior to the visit, additional usage of staff PPE, and extensive cleaning of exam room while observing appropriate contact time as indicated for disinfecting solutions.   I'm seeing this patient by the request  of:  Leamon Arnt, MD  CC: Right hip pain  [id number]  Susan Burch is a 65 y.o. female coming in with complaint of R hip pain. Last seen in 2018 for LBP. Patient states that her back pain is fine and has not been having issues anymore. Patient has been having Right hip pain for a few months now. Pain comes and goes and is worse with more activity which will cause a aching pain. Patient does not need to take anything for the pain, resting usually helps the pain go away. Patient locates pain to deep buttock pain/side of hip. Patient does have incidents where she will go to stand up and has to kind of catch her self like her hip is giving way.       Past Medical History:  Diagnosis Date   Centrilobular emphysema ([place]) 12/11/2019   Mild noted on CT for lung cancer screen 2021; former long term smoker.   GERD without esophagitis    HLD (hyperlipidemia)    Hypothyroidism    Mixed hyperlipidemia 02/20/2019   Sleep initiation dysfunction 09/27/2011   Past Surgical History:  Procedure Laterality Date   BREAST EXCISIONAL BIOPSY Right [place]   Social History   Socioeconomic History   Marital status: Married    Spouse name: Not on file   Number of children: Not on file   Years of education: Not on file   Highest education level: Not on file  Occupational History   Occupation: Therapist, sports    Employer:  [employer]  Tobacco Use   Smoking status: Former    Packs/day: 1.00    Years: 32.00    Pack years: 32.00    Types: Cigarettes    Quit date: 2006    Years since quitting: 16.6   Smokeless tobacco: Never  Vaping Use   Vaping Use: Never used  Substance and Sexual Activity   Alcohol use: Yes    Alcohol/week: 14.0 standard drinks    Types: 14 Glasses of wine per week    Comment: 2 glasses per day   Drug use: Never   Sexual activity: Yes  Other Topics Concern   Not on file  Social History Narrative   Not on file   Social Determinants of Health   Financial Resource Strain: Not on file  Food Insecurity: Not on file  Transportation Needs: Not on file  Physical Activity: Not on file  Stress: Not on file  Social Connections: Not on file   Allergies  Allergen Reactions   Bactrim    Penicillins    Family History  Problem Relation Age of Onset   Heart Problems Mother    Hypothyroidism Mother    High blood pressure Father    High Cholesterol Father    Diabetes Brother    Diabetes Maternal Grandmother    High blood pressure Maternal Uncle    Atrial fibrillation  Maternal Uncle    Hypothyroidism Maternal Uncle    Heart Problems Maternal Aunt    Cancer Maternal Aunt    Hypothyroidism Maternal Aunt    Cancer Cousin    Breast cancer Cousin    Hypothyroidism Daughter    Hypothyroidism Son     Current Outpatient Medications (Endocrine & Metabolic):    SYNTHROID 0000000 MCG tablet, TAKE 1 TABLET BY MOUTH ONCE A DAY BEFORE BREAKFAST   dexamethasone (DECADRON) 4 MG tablet, TAKE ONE TABLET BY MOUTH EVERY 12 HOURS (Patient not taking: Reported on 12/01/2020)   methylPREDNISolone (MEDROL DOSEPAK) 4 MG TBPK tablet, TAKE AS DIRECTED PER PACKAGE INSTRUCTIONS FOR 6 DAYS (Patient not taking: Reported on 12/01/2020)  Current Outpatient Medications (Cardiovascular):    rosuvastatin (CRESTOR) 5 MG tablet, TAKE 1 TABLET (5 MG TOTAL) BY MOUTH DAILY.   furosemide (LASIX) 20 MG tablet, Take 1 tablet  (20 mg total) by mouth daily. (Patient not taking: Reported on 12/01/2020)  Current Outpatient Medications (Respiratory):    albuterol (VENTOLIN HFA) 108 (90 Base) MCG/ACT inhaler, INHALE 2 PUFFS BY MOUTH INTO LUNGS EVERY 6 HOURS AS NEEDED   albuterol (VENTOLIN HFA) 108 (90 Base) MCG/ACT inhaler, Inhale 2 puffs into the lungs every 6 hours as needed   Budeson-Glycopyrrol-Formoterol 160-9-4.8 MCG/ACT AERO, INHALE 2 PUFFS BY MOUTH INTO LUNGS IN THE MORNING AND AT BEDTIME   montelukast (SINGULAIR) 10 MG tablet, TAKE 1 TABLET BY MOUTH AT BEDTIME.  Current Outpatient Medications (Analgesics):    ibuprofen (ADVIL) 800 MG tablet, TAKE 1 TABLET EVERY 8 HOURS WITH FOOD AS NEEDED. (Patient not taking: Reported on 12/01/2020)   meloxicam (MOBIC) 15 MG tablet, Take 1 tablet (15 mg total) by mouth daily. (Patient not taking: Reported on 12/01/2020)   Current Outpatient Medications (Other):    Ascorbic Acid (VITAMIN C) 500 MG CAPS, Take by mouth.   esomeprazole (NEXIUM) 20 MG capsule, TAKE 1 CAPSULE BY MOUTH DAILY   Multiple Vitamin (MULTIVITAMIN) tablet, Take 1 tablet by mouth daily.   Peak Flow Meter DEVI, Use as needed to check your breathing. Log your values   sertraline (ZOLOFT) 50 MG tablet, TAKE 1 TABLET BY MOUTH AT BEDTIME   Vitamin D, Cholecalciferol, 50 MCG (2000 UT) CAPS, Take by mouth.   Zinc 50 MG CAPS, Take by mouth.   zolpidem (AMBIEN) 5 MG tablet, TAKE 1 TABLET BY MOUTH AT BEDTIME AS NEEDED   chlorhexidine (PERIDEX) 0.12 % solution, RINSE MOUTH WITH 15ML (1 CAPFUL) FOR 30 SECONDS AM AND PM AFTER TOOTHBRUSHING. EXPECTORATE AFTER RINSING, DO NOT SWALLOW (Patient not taking: Reported on 12/01/2020)   potassium chloride (KLOR-CON) 10 MEQ tablet, Take 1 tablet (10 mEq total) by mouth daily as needed. With lasix (Patient not taking: Reported on 12/01/2020)   Reviewed prior external information including notes and imaging from  primary care provider As well as notes that were available from care  everywhere and other healthcare systems.  Past medical history, social, surgical and family history all reviewed in electronic medical record.  No pertanent information unless stated regarding to the chief complaint.   Review of Systems:  No headache, visual changes, nausea, vomiting, diarrhea, constipation, dizziness, abdominal pain, skin rash, fevers, chills, night sweats, weight loss, swollen lymph nodes, body aches, joint swelling, chest pain, shortness of breath, mood changes. POSITIVE muscle aches  Objective  Blood pressure 130/84, pulse 87, height '5\' 3"'$  (1.6 m), weight 213 lb (96.6 kg), SpO2 98 %.   General: No apparent distress alert and oriented x3 mood  and affect normal, dressed appropriately.  HEENT: Pupils equal, extraocular movements intact  Respiratory: Patient's speak in full sentences and does not appear short of breath  Cardiovascular: No lower extremity edema, non tender, no erythema  Gait hip exam shows patient has relatively good range of motion.  Patient does have a positive FABER test.  Severe tenderness over the greater trochanteric area.  Negative straight leg test.  Mild loss of lordosis of the lumbar spine.   [id number]; 15 additional minutes spent for Therapeutic exercises as stated in above notes.  This included exercises focusing on stretching, strengthening, with significant focus on eccentric aspects.   Long term goals include an improvement in range of motion, strength, endurance as well as avoiding reinjury. Patient's frequency would include in 1-2 times a day, 3-5 times a week for a duration of 6-12 weeks.Hip strengthening exercises which included:  Pelvic tilt/bracing to help with proper recruitment of the lower abs and pelvic floor muscles  Glute strengthening to properly contract glutes without over-engaging low back and hamstrings - prone hip extension and glute bridge exercises Proper stretching techniques to increase effectiveness for the hip flexors, groin,  quads, piriformic and low back when appropriate    Proper technique shown and discussed handout in great detail with ATC.  All questions were discussed and answered.     Impression and Recommendations:     The above documentation has been reviewed and is accurate and complete Lyndal Pulley, DO

## 2020-12-01 ENCOUNTER — Ambulatory Visit (INDEPENDENT_AMBULATORY_CARE_PROVIDER_SITE_OTHER): Payer: 59

## 2020-12-01 ENCOUNTER — Other Ambulatory Visit: Payer: Self-pay

## 2020-12-01 ENCOUNTER — Ambulatory Visit: Payer: 59 | Admitting: Family Medicine

## 2020-12-01 ENCOUNTER — Encounter: Payer: Self-pay | Admitting: Family Medicine

## 2020-12-01 VITALS — BP 130/84 | HR 87 | Ht 63.0 in | Wt 213.0 lb

## 2020-12-01 DIAGNOSIS — M7061 Trochanteric bursitis, right hip: Secondary | ICD-10-CM

## 2020-12-01 DIAGNOSIS — M1611 Unilateral primary osteoarthritis, right hip: Secondary | ICD-10-CM | POA: Diagnosis not present

## 2020-12-01 DIAGNOSIS — M25551 Pain in right hip: Secondary | ICD-10-CM

## 2020-12-01 NOTE — Patient Instructions (Addendum)
Good to see you  Get X-ray on your way out Voltaren OTC  Ice 20 minutes 2 times daily. Usually after activity and before bed. Exercises 3 times a week. Ice Try new balance shoes for work See me again in 5-6 weeks if not better we can do an injection

## 2020-12-01 NOTE — Assessment & Plan Note (Signed)
Greater trochanteric bursitis.  Seems to be fairly severe.  We will get x-rays to rule out any type of arthritic changes.  Is likely not contributing well.  We discussed topical anti-inflammatories, icing regimen, we discussed shoes significantly beneficial.  Patient given home exercises.  Follow-up in 5 to 6 weeks.  If continuing to have pain consider injection and formal physical therapy as well as further work-up for the back.

## 2020-12-02 ENCOUNTER — Other Ambulatory Visit (HOSPITAL_COMMUNITY): Payer: Self-pay

## 2020-12-02 MED ORDER — LIDOCAINE VISCOUS HCL 2 % MT SOLN
OROMUCOSAL | 2 refills | Status: DC
  Filled 2020-12-02 – 2021-02-24 (×2): qty 120, 6d supply, fill #0

## 2020-12-03 ENCOUNTER — Other Ambulatory Visit (HOSPITAL_COMMUNITY): Payer: Self-pay

## 2020-12-04 ENCOUNTER — Other Ambulatory Visit (HOSPITAL_COMMUNITY): Payer: Self-pay

## 2020-12-08 ENCOUNTER — Other Ambulatory Visit (HOSPITAL_COMMUNITY): Payer: Self-pay

## 2020-12-08 MED FILL — Budesonide-Glycopyrrolate-Formoterol Aers 160-9-4.8 MCG/ACT: RESPIRATORY_TRACT | 30 days supply | Qty: 10.7 | Fill #4 | Status: AC

## 2020-12-10 ENCOUNTER — Other Ambulatory Visit (HOSPITAL_COMMUNITY): Payer: Self-pay

## 2020-12-17 ENCOUNTER — Other Ambulatory Visit: Payer: Self-pay

## 2020-12-17 ENCOUNTER — Ambulatory Visit
Admission: RE | Admit: 2020-12-17 | Discharge: 2020-12-17 | Disposition: A | Discharge status: Home / Self Care | Payer: 59 | Source: Ambulatory Visit | Attending: Family Medicine | Admitting: Family Medicine

## 2020-12-17 DIAGNOSIS — Z1231 Encounter for screening mammogram for malignant neoplasm of breast: Secondary | ICD-10-CM

## 2020-12-21 ENCOUNTER — Other Ambulatory Visit (HOSPITAL_COMMUNITY): Payer: Self-pay

## 2020-12-21 MED FILL — Montelukast Sodium Tab 10 MG (Base Equiv): ORAL | 90 days supply | Qty: 90 | Fill #1 | Status: AC

## 2021-01-05 ENCOUNTER — Other Ambulatory Visit (HOSPITAL_COMMUNITY): Payer: Self-pay

## 2021-01-05 MED FILL — Budesonide-Glycopyrrolate-Formoterol Aers 160-9-4.8 MCG/ACT: RESPIRATORY_TRACT | 30 days supply | Qty: 10.7 | Fill #5 | Status: AC

## 2021-01-05 NOTE — Progress Notes (Signed)
[facility] [address] [place] Phone: [phone] Subjective:   Susan Burch, am serving as a scribe for Dr. Hulan Saas. This visit occurred during the SARS-CoV-2 public health emergency.  Safety protocols were in place, including screening questions prior to the visit, additional usage of staff PPE, and extensive cleaning of exam room while observing appropriate contact time as indicated for disinfecting solutions.   I'm seeing this patient by the request  of:  Leamon Arnt, MD  CC: Right hip pain follow-up  [id number]  12/01/2020 Greater trochanteric bursitis.  Seems to be fairly severe.  We will get x-rays to rule out any type of arthritic changes.  Is likely not contributing well.  We discussed topical anti-inflammatories, icing regimen, we discussed shoes significantly beneficial.  Patient given home exercises.  Follow-up in 5 to 6 weeks.  If continuing to have pain consider injection and formal physical therapy as well as further work-up for the back.  Update 01/06/2021 Susan Burch is a 65 y.o. female coming in with complaint of R hip pain. Patient states the pain is day to day. There are no new complaints, but the type and placement of the pain are the same.  Xray R hip 12/01/2020 IMPRESSION: Mild degenerative change.     Past Medical History:  Diagnosis Date   Centrilobular emphysema ([place]) 12/11/2019   Mild noted on CT for lung cancer screen 2021; former long term smoker.   GERD without esophagitis    HLD (hyperlipidemia)    Hypothyroidism    Mixed hyperlipidemia 02/20/2019   Sleep initiation dysfunction 09/27/2011   Past Surgical History:  Procedure Laterality Date   BREAST EXCISIONAL BIOPSY Right [place]   Social History   Socioeconomic History   Marital status: Married    Spouse name: Not on file   Number of children: Not on file    Years of education: Not on file   Highest education level: Not on file  Occupational History   Occupation: Therapist, sports    Employer: [employer]  Tobacco Use   Smoking status: Former    Packs/day: 1.00    Years: 32.00    Pack years: 32.00    Types: Cigarettes    Quit date: 2006    Years since quitting: 16.7   Smokeless tobacco: Never  Vaping Use   Vaping Use: Never used  Substance and Sexual Activity   Alcohol use: Yes    Alcohol/week: 14.0 standard drinks    Types: 14 Glasses of wine per week    Comment: 2 glasses per day   Drug use: Never   Sexual activity: Yes  Other Topics Concern   Not on file  Social History Narrative   Not on file   Social Determinants of Health   Financial Resource Strain: Not on file  Food Insecurity: Not on file  Transportation Needs: Not on file  Physical Activity: Not on file  Stress: Not on file  Social Connections: Not on file   Allergies  Allergen Reactions   Bactrim    Penicillins    Family History  Problem Relation Age of Onset   Heart Problems Mother    Hypothyroidism Mother    High blood pressure Father    High Cholesterol Father    Diabetes Brother    Diabetes Maternal Grandmother    High blood pressure Maternal Uncle  Atrial fibrillation Maternal Uncle    Hypothyroidism Maternal Uncle    Heart Problems Maternal Aunt    Cancer Maternal Aunt    Hypothyroidism Maternal Aunt    Cancer Cousin    Breast cancer Cousin    Hypothyroidism Daughter    Hypothyroidism Son     Current Outpatient Medications (Endocrine & Metabolic):    dexamethasone (DECADRON) 4 MG tablet, TAKE ONE TABLET BY MOUTH EVERY 12 HOURS (Patient not taking: Reported on 12/01/2020)   methylPREDNISolone (MEDROL DOSEPAK) 4 MG TBPK tablet, TAKE AS DIRECTED PER PACKAGE INSTRUCTIONS FOR 6 DAYS (Patient not taking: Reported on 12/01/2020)   SYNTHROID 137 MCG tablet, TAKE 1 TABLET BY MOUTH ONCE A DAY BEFORE BREAKFAST  Current Outpatient Medications (Cardiovascular):     furosemide (LASIX) 20 MG tablet, Take 1 tablet (20 mg total) by mouth daily. (Patient not taking: Reported on 12/01/2020)   rosuvastatin (CRESTOR) 5 MG tablet, TAKE 1 TABLET (5 MG TOTAL) BY MOUTH DAILY.  Current Outpatient Medications (Respiratory):    albuterol (VENTOLIN HFA) 108 (90 Base) MCG/ACT inhaler, INHALE 2 PUFFS BY MOUTH INTO LUNGS EVERY 6 HOURS AS NEEDED   albuterol (VENTOLIN HFA) 108 (90 Base) MCG/ACT inhaler, Inhale 2 puffs into the lungs every 6 hours as needed   Budeson-Glycopyrrol-Formoterol 160-9-4.8 MCG/ACT AERO, INHALE 2 PUFFS BY MOUTH INTO LUNGS IN THE MORNING AND AT BEDTIME   magic mouthwash (lidocaine, diphenhydrAMINE, alum & mag hydroxide) suspension*, Swish and spit 5 mls by mouth every 6 hours as needed   montelukast (SINGULAIR) 10 MG tablet, TAKE 1 TABLET BY MOUTH AT BEDTIME.  Current Outpatient Medications (Analgesics):    ibuprofen (ADVIL) 800 MG tablet, TAKE 1 TABLET EVERY 8 HOURS WITH FOOD AS NEEDED. (Patient not taking: Reported on 12/01/2020)   meloxicam (MOBIC) 15 MG tablet, Take 1 tablet (15 mg total) by mouth daily. (Patient not taking: Reported on 12/01/2020)   Current Outpatient Medications (Other):    Ascorbic Acid (VITAMIN C) 500 MG CAPS, Take by mouth.   chlorhexidine (PERIDEX) 0.12 % solution, RINSE MOUTH WITH 15ML (1 CAPFUL) FOR 30 SECONDS AM AND PM AFTER TOOTHBRUSHING. EXPECTORATE AFTER RINSING, DO NOT SWALLOW (Patient not taking: Reported on 12/01/2020)   esomeprazole (NEXIUM) 20 MG capsule, TAKE 1 CAPSULE BY MOUTH DAILY   magic mouthwash (lidocaine, diphenhydrAMINE, alum & mag hydroxide) suspension*, Swish and spit 5 mls by mouth every 6 hours as needed   Multiple Vitamin (MULTIVITAMIN) tablet, Take 1 tablet by mouth daily.   Peak Flow Meter DEVI, Use as needed to check your breathing. Log your values   potassium chloride (KLOR-CON) 10 MEQ tablet, Take 1 tablet (10 mEq total) by mouth daily as needed. With lasix (Patient not taking: Reported on 12/01/2020)    sertraline (ZOLOFT) 50 MG tablet, TAKE 1 TABLET BY MOUTH AT BEDTIME   Vitamin D, Cholecalciferol, 50 MCG (2000 UT) CAPS, Take by mouth.   Zinc 50 MG CAPS, Take by mouth.   zolpidem (AMBIEN) 5 MG tablet, TAKE 1 TABLET BY MOUTH AT BEDTIME AS NEEDED * These medications belong to multiple therapeutic classes and are listed under each applicable group.   Reviewed prior external information including notes and imaging from  primary care provider As well as notes that were available from care everywhere and other healthcare systems.  Past medical history, social, surgical and family history all reviewed in electronic medical record.  No pertanent information unless stated regarding to the chief complaint.   Review of Systems:  No headache, visual changes, nausea,  vomiting, diarrhea, constipation, dizziness, abdominal pain, skin rash, fevers, chills, night sweats, weight loss, swollen lymph nodes, body aches, joint swelling, chest pain, shortness of breath, mood changes. POSITIVE muscle aches  Objective  Blood pressure 132/84, pulse 82, height '5\' 3"'$  (1.6 m), weight 215 lb (97.5 kg), SpO2 95 %.   General: No apparent distress alert and oriented x3 mood and affect normal, dressed appropriately.  HEENT: Pupils equal, extraocular movements intact  Respiratory: Patient's speak in full sentences and does not appear short of breath  Cardiovascular: No lower extremity edema, non tender, no erythema  Gait normal with good balance and coordination.  MSK: Patient's right hip does have severe tenderness over the greater trochanteric area.  Patient has some very mild limited range of motion with internal rotation and does have tightness with FABER test.  Negative straight leg test.  Does have some loss of lordosis with tenderness over the right sacroiliac joint.   Procedure: Real-time Ultrasound Guided Injection of right greater trochanteric bursitis secondary to patient's body habitus Device: GE Logiq  Q7 Ultrasound guided injection is preferred based studies that show increased duration, increased effect, greater accuracy, decreased procedural pain, increased response rate, and decreased cost with ultrasound guided versus blind injection.  Verbal informed consent obtained.  Time-out conducted.  Noted no overlying erythema, induration, or other signs of local infection.  Skin prepped in a sterile fashion.  Local anesthesia: Topical Ethyl chloride.  With sterile technique and under real time ultrasound guidance:  Greater trochanteric area was visualized and patient's bursa was noted. A 22-gauge 3 inch needle was inserted and 4 cc of 0.5% Marcaine and 1 cc of Kenalog 40 mg/dL was injected. Pictures taken Completed without difficulty  Pain immediately resolved suggesting accurate placement of the medication.  Advised to call if fevers/chills, erythema, induration, drainage, or persistent bleeding.  Impression: Technically successful ultrasound guided injection.    Impression and Recommendations:    The above documentation has been reviewed and is accurate and complete Lyndal Pulley, DO

## 2021-01-06 ENCOUNTER — Ambulatory Visit: Payer: Self-pay

## 2021-01-06 ENCOUNTER — Other Ambulatory Visit: Payer: Self-pay

## 2021-01-06 ENCOUNTER — Encounter: Payer: Self-pay | Admitting: Family Medicine

## 2021-01-06 ENCOUNTER — Ambulatory Visit: Payer: 59 | Admitting: Family Medicine

## 2021-01-06 VITALS — BP 132/84 | HR 82 | Ht 63.0 in | Wt 215.0 lb

## 2021-01-06 DIAGNOSIS — M7061 Trochanteric bursitis, right hip: Secondary | ICD-10-CM | POA: Diagnosis not present

## 2021-01-06 NOTE — Assessment & Plan Note (Signed)
Chronic problem with continuing more of an exacerbation.  Given injection today and tolerated the procedure well.  Discussed icing regimen and home exercises, increase activity slowly.  Follow-up again with me in 6 to 8 weeks.  Worsening pain will consider the possibility of work-up for the lumbar spine but I think it is unlikely.  Also can consider formal physical therapy.

## 2021-01-06 NOTE — Patient Instructions (Signed)
Injection today Good to see you! Continue exercises See you again in 6-8 weeks

## 2021-01-12 ENCOUNTER — Other Ambulatory Visit (HOSPITAL_COMMUNITY): Payer: Self-pay

## 2021-01-12 ENCOUNTER — Other Ambulatory Visit: Payer: Self-pay | Admitting: Family Medicine

## 2021-01-12 DIAGNOSIS — G4700 Insomnia, unspecified: Secondary | ICD-10-CM

## 2021-01-12 MED ORDER — ZOLPIDEM TARTRATE 5 MG PO TABS
ORAL_TABLET | Freq: Every evening | ORAL | 1 refills | Status: DC | PRN
  Filled 2021-01-12: qty 90, 90d supply, fill #0
  Filled 2021-04-09: qty 90, 90d supply, fill #1

## 2021-01-29 ENCOUNTER — Other Ambulatory Visit (HOSPITAL_COMMUNITY): Payer: Self-pay

## 2021-01-29 MED FILL — Budesonide-Glycopyrrolate-Formoterol Aers 160-9-4.8 MCG/ACT: RESPIRATORY_TRACT | 30 days supply | Qty: 10.7 | Fill #6 | Status: AC

## 2021-02-02 ENCOUNTER — Other Ambulatory Visit (HOSPITAL_COMMUNITY): Payer: Self-pay

## 2021-02-11 ENCOUNTER — Other Ambulatory Visit: Payer: Self-pay | Admitting: Internal Medicine

## 2021-02-11 ENCOUNTER — Other Ambulatory Visit (HOSPITAL_COMMUNITY): Payer: Self-pay

## 2021-02-11 MED ORDER — SYNTHROID 137 MCG PO TABS
ORAL_TABLET | ORAL | 0 refills | Status: DC
  Filled 2021-02-11: qty 30, 30d supply, fill #0

## 2021-02-15 ENCOUNTER — Other Ambulatory Visit: Payer: Self-pay | Admitting: Family Medicine

## 2021-02-16 ENCOUNTER — Other Ambulatory Visit (HOSPITAL_COMMUNITY): Payer: Self-pay

## 2021-02-16 MED ORDER — SERTRALINE HCL 50 MG PO TABS
ORAL_TABLET | Freq: Every day | ORAL | 3 refills | Status: DC
  Filled 2021-02-16: qty 90, 90d supply, fill #0

## 2021-02-16 NOTE — Progress Notes (Deleted)
[facility] [address] [place] Phone: [phone] Subjective:    I'm seeing this patient by the request  of:  Leamon Arnt, MD  CC:   [id number]  9.14.2022 Chronic problem with continuing more of an exacerbation.  Given injection today and tolerated the procedure well.  Discussed icing regimen and home exercises, increase activity slowly.  Follow-up again with me in 6 to 8 weeks.  Worsening pain will consider the possibility of work-up for the lumbar spine but I think it is unlikely.  Also can consider formal physical therapy  Update 02/17/2021 Susan Burch is a 65 y.o. female coming in with complaint of R hip pain. Patient states       Past Medical History:  Diagnosis Date   Centrilobular emphysema ([place]) 12/11/2019   Mild noted on CT for lung cancer screen 2021; former long term smoker.   GERD without esophagitis    HLD (hyperlipidemia)    Hypothyroidism    Mixed hyperlipidemia 02/20/2019   Sleep initiation dysfunction 09/27/2011   Past Surgical History:  Procedure Laterality Date   BREAST EXCISIONAL BIOPSY Right [place]   Social History   Socioeconomic History   Marital status: Married    Spouse name: Not on file   Number of children: Not on file   Years of education: Not on file   Highest education level: Not on file  Occupational History   Occupation: Therapist, sports    Employer: [employer]  Tobacco Use   Smoking status: Former    Packs/day: 1.00    Years: 32.00    Pack years: 32.00    Types: Cigarettes    Quit date: 2006    Years since quitting: 16.8   Smokeless tobacco: Never  Vaping Use   Vaping Use: Never used  Substance and Sexual Activity   Alcohol use: Yes    Alcohol/week: 14.0 standard drinks    Types: 14 Glasses of wine per week    Comment: 2 glasses per day   Drug use: Never   Sexual activity: Yes  Other Topics Concern   Not  on file  Social History Narrative   Not on file   Social Determinants of Health   Financial Resource Strain: Not on file  Food Insecurity: Not on file  Transportation Needs: Not on file  Physical Activity: Not on file  Stress: Not on file  Social Connections: Not on file   Allergies  Allergen Reactions   Bactrim    Penicillins    Family History  Problem Relation Age of Onset   Heart Problems Mother    Hypothyroidism Mother    High blood pressure Father    High Cholesterol Father    Diabetes Brother    Diabetes Maternal Grandmother    High blood pressure Maternal Uncle    Atrial fibrillation Maternal Uncle    Hypothyroidism Maternal Uncle    Heart Problems Maternal Aunt    Cancer Maternal Aunt    Hypothyroidism Maternal Aunt    Cancer Cousin    Breast cancer Cousin    Hypothyroidism Daughter    Hypothyroidism Son     Current Outpatient Medications (Endocrine & Metabolic):    dexamethasone (DECADRON) 4 MG tablet, TAKE ONE TABLET BY MOUTH EVERY 12 HOURS (Patient not taking: Reported on 12/01/2020)   methylPREDNISolone (MEDROL DOSEPAK) 4 MG TBPK tablet, TAKE AS DIRECTED  PER PACKAGE INSTRUCTIONS FOR 6 DAYS (Patient not taking: Reported on 12/01/2020)   SYNTHROID 137 MCG tablet, TAKE 1 TABLET BY MOUTH ONCE A DAY BEFORE BREAKFAST  Current Outpatient Medications (Cardiovascular):    furosemide (LASIX) 20 MG tablet, Take 1 tablet (20 mg total) by mouth daily. (Patient not taking: Reported on 12/01/2020)   rosuvastatin (CRESTOR) 5 MG tablet, TAKE 1 TABLET (5 MG TOTAL) BY MOUTH DAILY.  Current Outpatient Medications (Respiratory):    albuterol (VENTOLIN HFA) 108 (90 Base) MCG/ACT inhaler, INHALE 2 PUFFS BY MOUTH INTO LUNGS EVERY 6 HOURS AS NEEDED   albuterol (VENTOLIN HFA) 108 (90 Base) MCG/ACT inhaler, Inhale 2 puffs into the lungs every 6 hours as needed   Budeson-Glycopyrrol-Formoterol 160-9-4.8 MCG/ACT AERO, INHALE 2 PUFFS BY MOUTH INTO LUNGS IN THE MORNING AND AT BEDTIME    magic mouthwash (lidocaine, diphenhydrAMINE, alum & mag hydroxide) suspension*, Swish and spit 5 mls by mouth every 6 hours as needed   montelukast (SINGULAIR) 10 MG tablet, TAKE 1 TABLET BY MOUTH AT BEDTIME.  Current Outpatient Medications (Analgesics):    ibuprofen (ADVIL) 800 MG tablet, TAKE 1 TABLET EVERY 8 HOURS WITH FOOD AS NEEDED. (Patient not taking: Reported on 12/01/2020)   meloxicam (MOBIC) 15 MG tablet, Take 1 tablet (15 mg total) by mouth daily. (Patient not taking: Reported on 12/01/2020)   Current Outpatient Medications (Other):    Ascorbic Acid (VITAMIN C) 500 MG CAPS, Take by mouth.   chlorhexidine (PERIDEX) 0.12 % solution, RINSE MOUTH WITH 15ML (1 CAPFUL) FOR 30 SECONDS AM AND PM AFTER TOOTHBRUSHING. EXPECTORATE AFTER RINSING, DO NOT SWALLOW (Patient not taking: Reported on 12/01/2020)   esomeprazole (NEXIUM) 20 MG capsule, TAKE 1 CAPSULE BY MOUTH DAILY   magic mouthwash (lidocaine, diphenhydrAMINE, alum & mag hydroxide) suspension*, Swish and spit 5 mls by mouth every 6 hours as needed   Multiple Vitamin (MULTIVITAMIN) tablet, Take 1 tablet by mouth daily.   Peak Flow Meter DEVI, Use as needed to check your breathing. Log your values   potassium chloride (KLOR-CON) 10 MEQ tablet, Take 1 tablet (10 mEq total) by mouth daily as needed. With lasix (Patient not taking: Reported on 12/01/2020)   sertraline (ZOLOFT) 50 MG tablet, TAKE 1 TABLET BY MOUTH AT BEDTIME   Vitamin D, Cholecalciferol, 50 MCG (2000 UT) CAPS, Take by mouth.   Zinc 50 MG CAPS, Take by mouth.   zolpidem (AMBIEN) 5 MG tablet, TAKE 1 TABLET BY MOUTH AT BEDTIME AS NEEDED * These medications belong to multiple therapeutic classes and are listed under each applicable group.   Reviewed prior external information including notes and imaging from  primary care provider As well as notes that were available from care everywhere and other healthcare systems.  Past medical history, social, surgical and family history all  reviewed in electronic medical record.  No pertanent information unless stated regarding to the chief complaint.   Review of Systems:  No headache, visual changes, nausea, vomiting, diarrhea, constipation, dizziness, abdominal pain, skin rash, fevers, chills, night sweats, weight loss, swollen lymph nodes, body aches, joint swelling, chest pain, shortness of breath, mood changes. POSITIVE muscle aches  Objective  There were no vitals taken for this visit.   General: No apparent distress alert and oriented x3 mood and affect normal, dressed appropriately.  HEENT: Pupils equal, extraocular movements intact  Respiratory: Patient's speak in full sentences and does not appear short of breath  Cardiovascular: No lower extremity edema, non tender, no erythema  Gait normal with good  balance and coordination.  MSK:  Non tender with full range of motion and good stability and symmetric strength and tone of shoulders, elbows, wrist, hip, knee and ankles bilaterally.     Impression and Recommendations:     The above documentation has been reviewed and is accurate and complete Jacqualin Combes

## 2021-02-17 ENCOUNTER — Ambulatory Visit: Payer: 59 | Admitting: Family Medicine

## 2021-02-24 ENCOUNTER — Other Ambulatory Visit: Payer: Self-pay | Admitting: Family Medicine

## 2021-02-24 ENCOUNTER — Other Ambulatory Visit (HOSPITAL_COMMUNITY): Payer: Self-pay

## 2021-02-24 DIAGNOSIS — R600 Localized edema: Secondary | ICD-10-CM

## 2021-02-24 MED ORDER — FUROSEMIDE 20 MG PO TABS
20.0000 mg | ORAL_TABLET | Freq: Every day | ORAL | 2 refills | Status: DC
  Filled 2021-02-24: qty 30, 30d supply, fill #0

## 2021-02-24 MED FILL — Rosuvastatin Calcium Tab 5 MG: ORAL | 90 days supply | Qty: 90 | Fill #2 | Status: AC

## 2021-02-27 ENCOUNTER — Other Ambulatory Visit (HOSPITAL_COMMUNITY): Payer: Self-pay

## 2021-02-28 MED FILL — Budesonide-Glycopyrrolate-Formoterol Aers 160-9-4.8 MCG/ACT: RESPIRATORY_TRACT | 30 days supply | Qty: 10.7 | Fill #7 | Status: AC

## 2021-03-01 ENCOUNTER — Other Ambulatory Visit (HOSPITAL_COMMUNITY): Payer: Self-pay

## 2021-03-02 ENCOUNTER — Other Ambulatory Visit (HOSPITAL_COMMUNITY): Payer: Self-pay

## 2021-03-03 ENCOUNTER — Other Ambulatory Visit (HOSPITAL_COMMUNITY): Payer: Self-pay

## 2021-03-04 ENCOUNTER — Other Ambulatory Visit (HOSPITAL_COMMUNITY): Payer: Self-pay

## 2021-03-16 ENCOUNTER — Telehealth: Payer: Self-pay | Admitting: Internal Medicine

## 2021-03-17 ENCOUNTER — Other Ambulatory Visit (HOSPITAL_COMMUNITY): Payer: Self-pay

## 2021-03-17 MED ORDER — SYNTHROID 137 MCG PO TABS
ORAL_TABLET | ORAL | 0 refills | Status: DC
  Filled 2021-03-17: qty 30, 30d supply, fill #0

## 2021-03-17 NOTE — Telephone Encounter (Signed)
Patient is returning phone call.  She was unable to get to phone in time.

## 2021-03-24 ENCOUNTER — Encounter: Payer: Self-pay | Admitting: Internal Medicine

## 2021-03-24 ENCOUNTER — Other Ambulatory Visit (HOSPITAL_COMMUNITY): Payer: Self-pay

## 2021-03-24 MED FILL — Montelukast Sodium Tab 10 MG (Base Equiv): ORAL | 90 days supply | Qty: 90 | Fill #2 | Status: AC

## 2021-03-25 ENCOUNTER — Ambulatory Visit (INDEPENDENT_AMBULATORY_CARE_PROVIDER_SITE_OTHER): Payer: 59 | Admitting: Internal Medicine

## 2021-03-25 ENCOUNTER — Encounter: Payer: Self-pay | Admitting: Internal Medicine

## 2021-03-25 ENCOUNTER — Other Ambulatory Visit: Payer: Self-pay

## 2021-03-25 ENCOUNTER — Other Ambulatory Visit (HOSPITAL_COMMUNITY): Payer: Self-pay

## 2021-03-25 VITALS — BP 138/92 | HR 77 | Ht 63.0 in | Wt 214.6 lb

## 2021-03-25 DIAGNOSIS — E039 Hypothyroidism, unspecified: Secondary | ICD-10-CM | POA: Diagnosis not present

## 2021-03-25 LAB — T4, FREE: Free T4: 1.16 ng/dL (ref 0.60–1.60)

## 2021-03-25 LAB — TSH: TSH: 0.1 u[IU]/mL — ABNORMAL LOW (ref 0.35–5.50)

## 2021-03-25 MED ORDER — LEVOTHYROXINE SODIUM 125 MCG PO TABS
125.0000 ug | ORAL_TABLET | Freq: Every day | ORAL | 3 refills | Status: DC
  Filled 2021-03-25: qty 45, 45d supply, fill #0

## 2021-03-25 NOTE — Progress Notes (Signed)
Patient ID: Susan Burch, female   DOB: 19-Oct-1955, 65 y.o.   MRN: [id number]   This visit occurred during the SARS-CoV-2 public health emergency.  Safety protocols were in place, including screening questions prior to the visit, additional usage of staff PPE, and extensive cleaning of exam room while observing appropriate contact time as indicated for disinfecting solutions.   HPI  Susan Burch is a 65 y.o.-year-old female, returning for follow-up for uncontrolled, acquired, hypothyroidism.  Last visit 1 year ago.  Interim history: She feels well at this visit, without complaints.  Reviewed history: Pt. has been dx with hypothyroidism in 1997-1998 after the birth of her son.  She was started on Synthroid then.  When I first saw her, she was on 175 mcg daily, however, she was not taking the medication correctly.  We were able to decrease the dose after she started to take it correctly.  Pt is on Synthroid d.a.w.  137 mcg daily (dose increased 03/2020), taken: - in am - fasting - at least 30 min from b'fast and coffee + creamer - no Ca, Fe - + MVI at night, PPIs (Nexium) at lunchtime - not on Biotin  Reviewed her TFTs: Lab Results  Component Value Date   TSH 0.62 05/20/2020   TSH 6.48 (H) 04/02/2020   TSH 14.68 (H) 02/12/2020   TSH 3.24 02/13/2019   TSH 1.05 06/05/2018   TSH 0.77 02/15/2018   TSH 1.08 11/15/2017   TSH 0.19 (L) 09/28/2017   TSH 0.04 (L) 08/15/2017   TSH 0.03 (L) 05/30/2017   FREET4 1.01 05/20/2020   FREET4 0.84 04/02/2020   FREET4 0.71 02/12/2020   FREET4 0.96 02/13/2019   FREET4 1.03 06/05/2018   FREET4 1.12 02/15/2018   FREET4 1.07 11/15/2017   FREET4 1.24 09/28/2017   FREET4 1.36 08/15/2017   FREET4 1.18 05/30/2017   T3FREE 3.5 05/30/2017   Pt denies: - feeling nodules in neck - hoarseness - choking - SOB with lying down She had occasional dysphagia due to GERD >> resolved on Nexium.  She has + FH of thyroid disorders in: M and M aunt and uncle.  Also all her children - hypothyroidism, M aunt. No FH of thyroid cancer. No h/o radiation tx to head or neck.  No herbal supplements. No Biotin use. On vitamin C, Zn, vitamin D 2000 units daily.  She was getting steroid injections for psoriasis of scalp.  Not recently. Pt. also has a history of asthma with cough.  She gets as needed steroids - no recent courses. She also has polycythemia. In 2021 she was diagnosed with mild emphysema on a chest CT.   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  ROS: + see HPI  I reviewed pt's medications, allergies, PMH, social hx, family hx, and changes were documented in the history of present illness. Otherwise, unchanged from my initial visit note.  Past Medical History:  Diagnosis Date   Centrilobular emphysema ([place]) 12/11/2019   Mild noted on CT for lung cancer screen 2021; former long term smoker.   GERD without esophagitis    HLD (hyperlipidemia)    Hypothyroidism    Mixed hyperlipidemia 02/20/2019   Sleep initiation dysfunction 09/27/2011   Past Surgical History:  Procedure Laterality Date   BREAST EXCISIONAL BIOPSY Right [place]   Social History   Socioeconomic History   Marital status: Married  Spouse name: Not on file   Number of children: 2          Occupational History   RN   Smoking status: Former Smoker    Packs/day: 0.50    Years: 4.00    Pack years: 2.00    Types: Cigarettes    Last attempt to quit: 07/12/2004    Years since quitting: 13.1   Smokeless tobacco: Never Used  Substance and Sexual Activity   Alcohol use: Yes    Alcohol/week: 8.4 oz    Types: 14 Glasses of red wine per week    Comment: 2 glasses per day   Drug use: No   Current Outpatient Medications on File Prior to Visit  Medication Sig Dispense Refill   albuterol (VENTOLIN HFA) 108 (90 Base) MCG/ACT inhaler INHALE 2 PUFFS BY MOUTH INTO LUNGS EVERY 6 HOURS AS NEEDED 18 g 1   albuterol  (VENTOLIN HFA) 108 (90 Base) MCG/ACT inhaler Inhale 2 puffs into the lungs every 6 hours as needed 18 g 0   Ascorbic Acid (VITAMIN C) 500 MG CAPS Take by mouth.     Budeson-Glycopyrrol-Formoterol 160-9-4.8 MCG/ACT AERO INHALE 2 PUFFS BY MOUTH INTO LUNGS IN THE MORNING AND AT BEDTIME 10.7 g 11   chlorhexidine (PERIDEX) 0.12 % solution RINSE MOUTH WITH 15ML (1 CAPFUL) FOR 30 SECONDS AM AND PM AFTER TOOTHBRUSHING. EXPECTORATE AFTER RINSING, DO NOT SWALLOW (Patient not taking: Reported on 12/01/2020) 473 mL 0   dexamethasone (DECADRON) 4 MG tablet TAKE ONE TABLET BY MOUTH EVERY 12 HOURS (Patient not taking: Reported on 12/01/2020) 6 tablet 0   esomeprazole (NEXIUM) 20 MG capsule TAKE 1 CAPSULE BY MOUTH DAILY 90 capsule 3   furosemide (LASIX) 20 MG tablet Take 1 tablet (20 mg total) by mouth daily. 30 tablet 2   ibuprofen (ADVIL) 800 MG tablet TAKE 1 TABLET EVERY 8 HOURS WITH FOOD AS NEEDED. (Patient not taking: Reported on 12/01/2020) 21 tablet 0   magic mouthwash (lidocaine, diphenhydrAMINE, alum & mag hydroxide) suspension Swish and spit 5 mls by mouth every 6 hours as needed 120 mL 2   meloxicam (MOBIC) 15 MG tablet Take 1 tablet (15 mg total) by mouth daily. (Patient not taking: Reported on 12/01/2020) 30 tablet 3   methylPREDNISolone (MEDROL DOSEPAK) 4 MG TBPK tablet TAKE AS DIRECTED PER PACKAGE INSTRUCTIONS FOR 6 DAYS (Patient not taking: Reported on 12/01/2020) 21 tablet 0   montelukast (SINGULAIR) 10 MG tablet TAKE 1 TABLET BY MOUTH AT BEDTIME. 90 tablet 3   Multiple Vitamin (MULTIVITAMIN) tablet Take 1 tablet by mouth daily.     Peak Flow Meter DEVI Use as needed to check your breathing. Log your values 1 each 0   potassium chloride (KLOR-CON) 10 MEQ tablet Take 1 tablet (10 mEq total) by mouth daily as needed. With lasix (Patient not taking: Reported on 12/01/2020)     rosuvastatin (CRESTOR) 5 MG tablet TAKE 1 TABLET (5 MG TOTAL) BY MOUTH DAILY. 90 tablet 3   sertraline (ZOLOFT) 50 MG tablet TAKE 1 TABLET  BY MOUTH AT BEDTIME 90 tablet 3   SYNTHROID 137 MCG tablet TAKE 1 TABLET BY MOUTH ONCE A DAY BEFORE BREAKFAST 30 tablet 0   Vitamin D, Cholecalciferol, 50 MCG (2000 UT) CAPS Take by mouth.     Zinc 50 MG CAPS Take by mouth.     zolpidem (AMBIEN) 5 MG tablet TAKE 1 TABLET BY MOUTH AT BEDTIME AS NEEDED 90 tablet 1   No current facility-administered medications on  file prior to visit.   Allergies  Allergen Reactions   Bactrim    Penicillins    FH: Diabetes in brother, MGM HTN in father, maternal uncle HL in father Heart disease in mother, maternal aunt, maternal uncle Thyroid problems-see HPI Cancer in maternal aunt and first cousin: Multiple myeloma, lymphoma, respectively  PE: BP (!) 138/92 (BP Location: Right Arm, Patient Position: Sitting, Cuff Size: Normal)   Pulse 77   Ht $R'5\' 3"'Ia$  (1.6 m)   Wt 214 lb 9.6 oz (97.3 kg)   SpO2 93%   BMI 38.01 kg/m  Wt Readings from Last 3 Encounters:  03/25/21 214 lb 9.6 oz (97.3 kg)  01/06/21 215 lb (97.5 kg)  12/01/20 213 lb (96.6 kg)   Constitutional: overweight, in NAD Eyes: PERRLA, EOMI, no exophthalmos ENT: moist mucous membranes, no thyromegaly, no cervical lymphadenopathy Cardiovascular: RRR, No MRG Respiratory: CTA B Musculoskeletal: no deformities, strength intact in all 4 Skin: moist, warm, no rashes Neurological: no tremor with outstretched hands, DTR normal in all 4  ASSESSMENT: 1. Hypothyroidism  PLAN:  1. Patient with longstanding acquired hypothyroidism, on levothyroxine therapy.  Her TSH levels have been fluctuating in the past most likely due to incorrect dosing.  We separated multivitamins and coffee from levothyroxine and afterwards we were able to back off her levothyroxine dose. - latest thyroid labs reviewed with pt. >> normal: Lab Results  Component Value Date   TSH 0.62 05/20/2020  - she continues on LT4 137 mcg daily, dose increased in 03/2020 when TSH was higher after starting Nexium. - pt feels good on  this dose.  She is interested in weight loss medications-I advised her to check with PCP and discussed about the [facility] weight [facility].  I did explain that losing a significant amount of weight may change her levothyroxine requirement. - we discussed about taking the thyroid hormone every day, with water, >30 minutes before breakfast, separated by >4 hours from acid reflux medications, calcium, iron, multivitamins. Pt. is taking it correctly. - will check thyroid tests today: TSH and fT4 - If labs are abnormal, she will need to return for repeat TFTs in 1.5 months  Needs refills.  Component     Latest Ref Rng & Units 03/25/2021  TSH     0.35 - 5.50 uIU/mL 0.10 (L)  T4,Free(Direct)     0.60 - 1.60 ng/dL 1.16  TSH is too low now.  We will need to decrease the dose back to 125 mcg daily and recheck her tests in 1.5 months.  Philemon Kingdom, MD PhD [facility]

## 2021-03-25 NOTE — Patient Instructions (Signed)
Please stop at the lab.  Please continue Synthroid 137 mcg daily.  Take the thyroid hormone every day, with water, at least 30 minutes before breakfast, separated by at least 4 hours from: - acid reflux medications - calcium - iron - multivitamins  Please come back for a follow-up appointment in 1 year.

## 2021-03-26 ENCOUNTER — Other Ambulatory Visit (HOSPITAL_COMMUNITY): Payer: Self-pay

## 2021-03-26 ENCOUNTER — Other Ambulatory Visit: Payer: Self-pay | Admitting: Internal Medicine

## 2021-03-26 MED ORDER — SYNTHROID 125 MCG PO TABS
125.0000 ug | ORAL_TABLET | Freq: Every day | ORAL | 3 refills | Status: DC
  Filled 2021-03-26: qty 45, 45d supply, fill #0
  Filled 2021-05-07: qty 45, 45d supply, fill #1
  Filled 2021-06-23: qty 45, 45d supply, fill #2
  Filled 2021-08-06: qty 45, 45d supply, fill #3

## 2021-03-30 ENCOUNTER — Other Ambulatory Visit (HOSPITAL_COMMUNITY): Payer: Self-pay

## 2021-03-30 MED FILL — Budesonide-Glycopyrrolate-Formoterol Aers 160-9-4.8 MCG/ACT: RESPIRATORY_TRACT | 30 days supply | Qty: 10.7 | Fill #8 | Status: AC

## 2021-04-09 ENCOUNTER — Other Ambulatory Visit (HOSPITAL_COMMUNITY): Payer: Self-pay

## 2021-04-27 ENCOUNTER — Other Ambulatory Visit (HOSPITAL_COMMUNITY): Payer: Self-pay

## 2021-04-27 MED FILL — Budesonide-Glycopyrrolate-Formoterol Aers 160-9-4.8 MCG/ACT: RESPIRATORY_TRACT | 30 days supply | Qty: 10.7 | Fill #9 | Status: AC

## 2021-04-30 ENCOUNTER — Encounter: Payer: Self-pay | Admitting: Family Medicine

## 2021-05-03 ENCOUNTER — Other Ambulatory Visit (HOSPITAL_COMMUNITY): Payer: Self-pay

## 2021-05-03 MED ORDER — SERTRALINE HCL 100 MG PO TABS
100.0000 mg | ORAL_TABLET | Freq: Every day | ORAL | 0 refills | Status: DC
  Filled 2021-05-03: qty 90, 90d supply, fill #0

## 2021-05-05 ENCOUNTER — Other Ambulatory Visit: Payer: 59

## 2021-05-06 ENCOUNTER — Other Ambulatory Visit: Payer: Self-pay

## 2021-05-06 ENCOUNTER — Other Ambulatory Visit (INDEPENDENT_AMBULATORY_CARE_PROVIDER_SITE_OTHER): Payer: 59

## 2021-05-06 DIAGNOSIS — E039 Hypothyroidism, unspecified: Secondary | ICD-10-CM | POA: Diagnosis not present

## 2021-05-06 LAB — TSH: TSH: 1.31 u[IU]/mL (ref 0.35–5.50)

## 2021-05-06 LAB — T4, FREE: Free T4: 1.02 ng/dL (ref 0.60–1.60)

## 2021-05-07 ENCOUNTER — Other Ambulatory Visit (HOSPITAL_COMMUNITY): Payer: Self-pay

## 2021-05-08 ENCOUNTER — Other Ambulatory Visit (HOSPITAL_COMMUNITY): Payer: Self-pay

## 2021-05-25 ENCOUNTER — Other Ambulatory Visit (HOSPITAL_COMMUNITY): Payer: Self-pay

## 2021-05-25 ENCOUNTER — Other Ambulatory Visit: Payer: Self-pay | Admitting: Family Medicine

## 2021-05-26 ENCOUNTER — Other Ambulatory Visit (HOSPITAL_COMMUNITY): Payer: Self-pay

## 2021-05-26 ENCOUNTER — Other Ambulatory Visit: Payer: Self-pay | Admitting: Family Medicine

## 2021-05-26 DIAGNOSIS — E782 Mixed hyperlipidemia: Secondary | ICD-10-CM

## 2021-05-26 MED ORDER — ROSUVASTATIN CALCIUM 5 MG PO TABS
ORAL_TABLET | Freq: Every day | ORAL | 3 refills | Status: DC
  Filled 2021-05-26: qty 90, 90d supply, fill #0
  Filled 2021-08-19: qty 90, 90d supply, fill #1
  Filled 2021-11-24: qty 90, 90d supply, fill #2
  Filled 2022-02-23: qty 90, 90d supply, fill #3

## 2021-05-27 ENCOUNTER — Other Ambulatory Visit (HOSPITAL_COMMUNITY): Payer: Self-pay

## 2021-05-27 MED ORDER — BREZTRI AEROSPHERE 160-9-4.8 MCG/ACT IN AERO
INHALATION_SPRAY | RESPIRATORY_TRACT | 11 refills | Status: DC
  Filled 2021-05-27: qty 10.7, 30d supply, fill #0
  Filled 2021-06-23: qty 10.7, 30d supply, fill #1
  Filled 2021-07-27: qty 10.7, 30d supply, fill #2
  Filled 2021-08-27: qty 10.7, 30d supply, fill #3
  Filled 2021-10-15: qty 10.7, 30d supply, fill #4

## 2021-05-28 ENCOUNTER — Other Ambulatory Visit (HOSPITAL_COMMUNITY): Payer: Self-pay

## 2021-06-23 ENCOUNTER — Other Ambulatory Visit (HOSPITAL_COMMUNITY): Payer: Self-pay

## 2021-06-24 ENCOUNTER — Other Ambulatory Visit: Payer: Self-pay

## 2021-06-24 ENCOUNTER — Other Ambulatory Visit (HOSPITAL_COMMUNITY): Payer: Self-pay

## 2021-07-07 ENCOUNTER — Other Ambulatory Visit (HOSPITAL_COMMUNITY): Payer: Self-pay

## 2021-07-07 ENCOUNTER — Other Ambulatory Visit: Payer: Self-pay | Admitting: Family Medicine

## 2021-07-07 DIAGNOSIS — J301 Allergic rhinitis due to pollen: Secondary | ICD-10-CM

## 2021-07-07 DIAGNOSIS — G4700 Insomnia, unspecified: Secondary | ICD-10-CM

## 2021-07-07 MED ORDER — ZOLPIDEM TARTRATE 5 MG PO TABS
ORAL_TABLET | Freq: Every evening | ORAL | 1 refills | Status: DC | PRN
  Filled 2021-07-07: qty 90, 90d supply, fill #0
  Filled 2021-10-07: qty 90, 90d supply, fill #1

## 2021-07-07 MED ORDER — MONTELUKAST SODIUM 10 MG PO TABS
ORAL_TABLET | Freq: Every day | ORAL | 3 refills | Status: DC
  Filled 2021-07-07: qty 90, 90d supply, fill #0
  Filled 2021-10-15: qty 90, 90d supply, fill #1

## 2021-07-07 NOTE — Telephone Encounter (Signed)
LAST APPOINTMENT DATE: 05/26/2021 ? ? ?NEXT APPOINTMENT DATE: 07/22/2021 ? ? ? ?LAST REFILL: 01/12/21 ? ?QTY:90  ?ref 1 ? ? ? ?  ?

## 2021-07-22 ENCOUNTER — Other Ambulatory Visit (HOSPITAL_COMMUNITY): Payer: Self-pay

## 2021-07-22 ENCOUNTER — Ambulatory Visit (INDEPENDENT_AMBULATORY_CARE_PROVIDER_SITE_OTHER): Payer: 59 | Admitting: Family Medicine

## 2021-07-22 ENCOUNTER — Encounter: Payer: Self-pay | Admitting: Family Medicine

## 2021-07-22 VITALS — BP 131/81 | HR 83 | Temp 97.9°F | Ht 63.0 in | Wt 207.8 lb

## 2021-07-22 DIAGNOSIS — F4323 Adjustment disorder with mixed anxiety and depressed mood: Secondary | ICD-10-CM

## 2021-07-22 DIAGNOSIS — J4530 Mild persistent asthma, uncomplicated: Secondary | ICD-10-CM | POA: Diagnosis not present

## 2021-07-22 DIAGNOSIS — Z0000 Encounter for general adult medical examination without abnormal findings: Secondary | ICD-10-CM | POA: Diagnosis not present

## 2021-07-22 DIAGNOSIS — E039 Hypothyroidism, unspecified: Secondary | ICD-10-CM | POA: Diagnosis not present

## 2021-07-22 DIAGNOSIS — J432 Centrilobular emphysema: Secondary | ICD-10-CM | POA: Diagnosis not present

## 2021-07-22 DIAGNOSIS — E782 Mixed hyperlipidemia: Secondary | ICD-10-CM

## 2021-07-22 DIAGNOSIS — K219 Gastro-esophageal reflux disease without esophagitis: Secondary | ICD-10-CM | POA: Diagnosis not present

## 2021-07-22 DIAGNOSIS — Z23 Encounter for immunization: Secondary | ICD-10-CM

## 2021-07-22 LAB — CBC WITH DIFFERENTIAL/PLATELET
Basophils Absolute: 0.1 10*3/uL (ref 0.0–0.1)
Basophils Relative: 0.5 % (ref 0.0–3.0)
Eosinophils Absolute: 0.2 10*3/uL (ref 0.0–0.7)
Eosinophils Relative: 1.7 % (ref 0.0–5.0)
HCT: 44.6 % (ref 36.0–46.0)
Hemoglobin: 14.7 g/dL (ref 12.0–15.0)
Lymphocytes Relative: 8.6 % — ABNORMAL LOW (ref 12.0–46.0)
Lymphs Abs: 1.3 10*3/uL (ref 0.7–4.0)
MCHC: 32.9 g/dL (ref 30.0–36.0)
MCV: 94.8 fl (ref 78.0–100.0)
Monocytes Absolute: 1 10*3/uL (ref 0.1–1.0)
Monocytes Relative: 7.1 % (ref 3.0–12.0)
Neutro Abs: 12.1 10*3/uL — ABNORMAL HIGH (ref 1.4–7.7)
Neutrophils Relative %: 82.1 % — ABNORMAL HIGH (ref 43.0–77.0)
Platelets: 236 10*3/uL (ref 150.0–400.0)
RBC: 4.71 Mil/uL (ref 3.87–5.11)
RDW: 14.5 % (ref 11.5–15.5)
WBC: 14.7 10*3/uL — ABNORMAL HIGH (ref 4.0–10.5)

## 2021-07-22 LAB — LIPID PANEL
Cholesterol: 187 mg/dL (ref 0–200)
HDL: 65.5 mg/dL (ref >39.00)
LDL Cholesterol: 89 mg/dL (ref 0–99)
NonHDL: 121.61
Total CHOL/HDL Ratio: 3
Triglycerides: 163 mg/dL — ABNORMAL HIGH (ref 0.0–149.0)
VLDL: 32.6 mg/dL (ref 0.0–40.0)

## 2021-07-22 LAB — COMPREHENSIVE METABOLIC PANEL
ALT: 26 U/L (ref 0–35)
AST: 29 U/L (ref 0–37)
Albumin: 4.3 g/dL (ref 3.5–5.2)
Alkaline Phosphatase: 81 U/L (ref 39–117)
BUN: 22 mg/dL (ref 6–23)
CO2: 22 mEq/L (ref 19–32)
Calcium: 9.7 mg/dL (ref 8.4–10.5)
Chloride: 103 mEq/L (ref 96–112)
Creatinine, Ser: 1.06 mg/dL (ref 0.40–1.20)
GFR: 55.73 mL/min — ABNORMAL LOW (ref >60.00)
Glucose, Bld: 102 mg/dL — ABNORMAL HIGH (ref 70–99)
Potassium: 3.9 mEq/L (ref 3.5–5.1)
Sodium: 137 mEq/L (ref 135–145)
Total Bilirubin: 0.5 mg/dL (ref 0.2–1.2)
Total Protein: 6.8 g/dL (ref 6.0–8.3)

## 2021-07-22 MED ORDER — SERTRALINE HCL 100 MG PO TABS
100.0000 mg | ORAL_TABLET | Freq: Every day | ORAL | 3 refills | Status: DC
  Filled 2021-07-22: qty 90, 90d supply, fill #0
  Filled 2021-10-27: qty 90, 90d supply, fill #1
  Filled 2022-02-01: qty 90, 90d supply, fill #2
  Filled 2022-04-26 – 2022-05-03 (×2): qty 90, 90d supply, fill #3

## 2021-07-22 MED ORDER — ESOMEPRAZOLE MAGNESIUM 20 MG PO CPDR
DELAYED_RELEASE_CAPSULE | Freq: Every day | ORAL | 3 refills | Status: DC
  Filled 2021-07-22: qty 90, 90d supply, fill #0
  Filled 2022-04-26: qty 90, 90d supply, fill #1

## 2021-07-22 NOTE — Progress Notes (Signed)
?Subjective  ? ?Chief Complaint  ?Patient presents with  ? Annual Exam  ?  She is not fasting.  ? Hypothyroidism  ? Gastroesophageal Reflux  ? Hyperlipidemia  ? Asthma  ? Nasal Congestion  ?  Started 2 weeks ago, she has taken Zyrtec for symptoms, but still has not subsided. She denies fever. Covid test was negative.  ? ? ?HPI: Susan Burch is a 66 y.o. female who presents to [place] [facility] today for a Female Wellness Visit. She also has the concerns and/or needs as listed above in the chief complaint. These will be addressed in addition to the Health Maintenance Visit.  ? ?Wellness Visit: annual visit with health maintenance review and exam without Pap ? ?HM: screens are current. Sees gyn. Lung cancer screen 2021 neg. Eligible for Tdap.  ?Chronic disease f/u and/or acute problem visit: (deemed necessary to be done in addition to the wellness visit): ?Copd/asthma; controlled on breztri ?Allergies are active: on zyrtec and flonase. Using albuterol for allergic asthma sxs. No f/c/ or productive cough.  ?Mood controlled on long term zoloft w/o Aes ?Reviewed endocrine notes/labs. Hypothyroidism is controlled on 134mg levothyroxine.  ?Lab Results  ?Component Value Date  ? TSH 1.31 05/06/2021  ?GERD on long term nexium. Doing well. ?HLD on statin. Due for recheck. Crestor 5 nightly. ? ?Assessment  ?1. Annual physical exam   ?[place]. [place]   ?3. Mixed hyperlipidemia   ?4. Situational mixed anxiety and depressive disorder   ?5. Hypothyroidism (acquired)   ?6. Gastroesophageal reflux disease without esophagitis   ?7. Mild persistent asthma without complication   ? ?  ?Plan  ?Female Wellness Visit: ?Age appropriate Health Maintenance and Prevention measures were discussed with patient. Included topics are cancer screening recommendations, ways to keep healthy (see AVS) including dietary and exercise recommendations, regular eye and dental care, use of seat belts, and avoidance of  moderate alcohol use and tobacco use.  ?BMI: discussed patient's BMI and encouraged positive lifestyle modifications to help get to or maintain a target BMI. ?HM needs and immunizations were addressed and ordered. See below for orders. See HM and immunization section for updates. Tdap today ?Routine labs and screening tests ordered including cmp, cbc and lipids where appropriate. ?Discussed recommendations regarding Vit D and calcium supplementation (see AVS) ? ?Chronic disease management visit and/or acute problem visit: ?Chronic problems: HLD, low thyroid, GERD, depression and copd/asthma are all well controlled. ?Continue crestor 5 for hld, zoloft 100 for depression, breztri daily for copd/ashtma, zyrtec/flonase for allergies, nexium 20 daily for gerd and prn albuterol for asthma.  ? ?Follow up: Return in about 1 year (around 07/23/2022) for complete physical.  ?Orders Placed This Encounter  ?Procedures  ? CBC with Differential/Platelet  ? Comprehensive metabolic panel  ? Lipid panel  ? ?Meds ordered this encounter  ?Medications  ? esomeprazole (NEXIUM) 20 MG capsule  ?  Sig: TAKE 1 CAPSULE BY MOUTH DAILY  ?  Dispense:  90 capsule  ?  Refill:  3  ? sertraline (ZOLOFT) 100 MG tablet  ?  Sig: Take 1 tablet (100 mg total) by mouth at bedtime.  ?  Dispense:  90 tablet  ?  Refill:  3  ? ?  ? ?Body mass index is 36.81 kg/m?. ?Wt Readings from Last 3 Encounters:  ?07/22/21 207 lb 12.8 oz (94.3 kg)  ?03/25/21 214 lb 9.6 oz (97.3 kg)  ?01/06/21 215 lb (97.5 kg)  ? ? ? ?Patient Active  Problem List  ? Diagnosis Date Noted  ? Centrilobular emphysema ([place]) 12/11/2019  ?  Priority: High  ?  Mild noted on CT for lung cancer screen 2021; former long term smoker. ?  ? Obesity (BMI 30-39.9) 06/12/2019  ?  Priority: High  ? Mixed hyperlipidemia 02/20/2019  ?  Priority: High  ? Situational mixed anxiety and depressive disorder 11/12/2018  ?  Priority: High  ? Polycythemia, mild, evaluation by Dr Irene Limbo 2020 09/05/2018  ?  Priority:  High  ?  Evaluation by Dr. Irene Limbo 2020. ? ?-Discussed patient's most recent labs from 06/05/18, HGB at 16.2 and HCT at 47.8. Other blood counts normal. GFR slightly low at 53.31, other chemistries are normal. ?-Reviewed previous labs; earliest available HGB from 05/26/15 was at 15.6 with a HCT of 46.3. 09/21/16 HGB at 15.5. 05/30/17 HGB at 15.6. ?-Discussed that the patient's polycythemia is borderline, and after looking at the trend over the last 3.5 years, she has not had progression nor other blood count abnormalities. Do not suspect primary bone marrow disorder such as polycythemia vera. ?-Discussed that her mild polycythemia is likely secondary from hemo-concentration from diuretics vs lower oxygen levels due to asthma vs lower oxygen levels due to sleep apnea vs fluctuating thyroid levels ?-Recommend pursuing sleep study with PCP to rule out sleep apnea, which could be a cause of mild polycythemia, and pt does endorse snoring ?  ? Bilateral lower extremity edema 04/11/2018  ?  Priority: High  ?  Echocardiogram 07/6268: mild diastolic dysfunction; nl EF. Mild LVH, prn lasix after long shifts at work ?  ? Sleep initiation dysfunction 09/27/2011  ?  Priority: High  ? Hypothyroidism (acquired) 11/06/2006  ?  Priority: High  ?  Managed by Dr. Cruzita Lederer ? ?  ? Former smoker, 32 pack year history, quit 2006 11/12/2018  ?  Priority: Medium   ? Episodic tension-type headache, not intractable 11/12/2018  ?  Priority: Medium   ? Mild persistent asthma 09/21/2016  ?  Priority: Medium   ?  Has seen Dr. Donneta Romberg in the past ?  ? Postmenopausal disorder 02/14/2013  ?  Priority: Medium   ? GERD 11/06/2006  ?  Priority: Medium   ? Allergic rhinitis 11/06/2006  ?  Priority: Low  ? ?Health Maintenance  ?Topic Date Due  ? TETANUS/TDAP  04/25/2021  ? COVID-19 Vaccine (4 - Booster for Pfizer series) 08/07/2021 (Originally 03/28/2020)  ? MAMMOGRAM  12/17/2021  ? PAP SMEAR-Modifier  06/06/2023  ? COLONOSCOPY (Pts 45-54yr Insurance coverage will  need to be confirmed)  11/15/2028  ? INFLUENZA VACCINE  Completed  ? Hepatitis C Screening  Completed  ? HIV Screening  Completed  ? Zoster Vaccines- Shingrix  Completed  ? HPV VACCINES  Aged Out  ? ?Immunization History  ?Administered Date(s) Administered  ? Influenza, Quadrivalent, Recombinant, Inj, Pf 01/21/2019  ? Influenza-Unspecified 01/23/2014, 12/25/2014, 01/23/2018, 01/24/2020, 01/23/2021  ? PFIZER(Purple Top)SARS-COV-2 Vaccination 04/20/2019, 05/09/2019, 02/01/2020  ? Pneumococcal Conjugate-13 11/28/2006  ? Pneumococcal Polysaccharide-23 02/14/2013  ? Td 07/03/2006  ? Zoster Recombinat (Shingrix) 01/21/2019, 06/19/2020  ? ?We updated and reviewed the patient's past history in detail and it is documented below. ?Allergies: ?Patient is allergic to bactrim and penicillins. ?Past Medical History ?Patient  has a past medical history of Centrilobular [place]se[place] (12[place]), GERD without esophagitis, HLD (hyperlipidemia), Hypothyroidism, Mixed hyperlipidemia (02/20/2019), and Sleep initiation dysfunction (09/27/2011). ?Past Surgical History ?Patient  has a past surgical history that includes Breast excisional biopsy (Right, 1990); Myomectomy (1993); Cholecystectomy (  1998); and Cesarean section (1994, 1996). ?Family History: ?Patient family history includes Atrial fibrillation in her maternal uncle; Breast cancer in her cousin; Cancer in her cousin and maternal aunt; Diabetes in her brother and maternal grandmother; Heart Problems in her maternal aunt and mother; High Cholesterol in her father; High blood pressure in her father and maternal uncle; Hypothyroidism in her daughter, maternal aunt, maternal uncle, mother, and son. ?Social History:  ?Patient  reports that she quit smoking about 17 years ago. Her smoking use included cigarettes. She has a 32.00 pack-year smoking history. She has never used smokeless tobacco. She reports current alcohol use of about 14.0 standard drinks per week. She reports that she  does not use drugs. ? ?Review of Systems: ?Constitutional: negative for fever or malaise ?Ophthalmic: negative for photophobia, double vision or loss of vision ?Cardiovascular: negative for chest pain, dy

## 2021-07-22 NOTE — Addendum Note (Signed)
Addended by: Jerrel Ivory D on: 07/22/2021 11:26 AM ? ? Modules accepted: Orders ? ?

## 2021-07-22 NOTE — Patient Instructions (Addendum)
Please return in 12 months for your annual complete physical; please come fasting.  ? ?I will release your lab results to you on your MyChart account with further instructions. You may see the results before I do, but when I review them I will send you a message with my report or have my assistant call you if things need to be discussed. Please reply to my message with any questions. Thank you!  ? ?Today you were given your Tdap vaccination.  This is good for 10 years.  ? ?If you have any questions or concerns, please don't hesitate to send me a message via MyChart or call the office at [phone]. Thank you for visiting with [place] today! It's our pleasure caring for you.  ? ?Please do these things to maintain good health! ? ?Exercise at least 30-45 minutes a day,  4-5 days a week.  ?Eat a low-fat diet with lots of fruits and vegetables, up to 7-9 servings per day. ?Drink plenty of water daily. Try to drink 8 8oz glasses per day. ?Seatbelts can save your life. Always wear your seatbelt. ?Place Smoke Detectors on every level of your home and check batteries every year. ?Schedule an appointment with an eye doctor for an eye exam every 1-2 years ?Safe sex - use condoms to protect yourself from STDs if you could be exposed to these types of infections. Use birth control if you do not want to become pregnant and are sexually active. ?Avoid heavy alcohol use. If you drink, keep it to less than 2 drinks/day and not every day. ?[place].  Choose someone you trust that could speak for you if you became unable to speak for yourself. ?Depression is common in our stressful world.If you're feeling down or losing interest in things you normally enjoy, please come in for a visit. ?If anyone is threatening or hurting you, please get help. Physical or Emotional Violence is never OK.   ?

## 2021-07-23 ENCOUNTER — Encounter: Payer: Self-pay | Admitting: Family Medicine

## 2021-07-23 ENCOUNTER — Other Ambulatory Visit: Payer: Self-pay

## 2021-07-23 DIAGNOSIS — J432 Centrilobular emphysema: Secondary | ICD-10-CM

## 2021-07-25 ENCOUNTER — Encounter: Payer: Self-pay | Admitting: Family Medicine

## 2021-07-26 NOTE — Addendum Note (Signed)
Addended by: Jerrel Ivory D on: 07/26/2021 12:10 PM ? ? Modules accepted: Orders ? ?

## 2021-07-27 ENCOUNTER — Other Ambulatory Visit (HOSPITAL_COMMUNITY): Payer: Self-pay

## 2021-07-29 ENCOUNTER — Encounter: Payer: Self-pay | Admitting: Family Medicine

## 2021-07-29 ENCOUNTER — Other Ambulatory Visit (HOSPITAL_COMMUNITY): Payer: Self-pay

## 2021-07-29 ENCOUNTER — Ambulatory Visit: Payer: 59 | Admitting: Family Medicine

## 2021-07-29 VITALS — BP 131/83 | HR 72 | Temp 97.8°F | Ht 63.0 in | Wt 209.4 lb

## 2021-07-29 DIAGNOSIS — E669 Obesity, unspecified: Secondary | ICD-10-CM | POA: Diagnosis not present

## 2021-07-29 MED ORDER — WEGOVY 0.5 MG/0.5ML ~~LOC~~ SOAJ: 0.5000 mg | SUBCUTANEOUS | 1 refills | Status: DC

## 2021-07-29 MED ORDER — WEGOVY 0.25 MG/0.5ML ~~LOC~~ SOAJ
0.2500 mg | SUBCUTANEOUS | 0 refills | Status: DC
  Filled 2021-07-29 – 2021-08-13 (×3): qty 2, 28d supply, fill #0

## 2021-07-29 NOTE — Progress Notes (Signed)
? ? ?Subjective  ?CC:  ?Chief Complaint  ?Patient presents with  ? Weight Loss  ?  Pt would like to discuss options. She does not have an exercise routine in play. Hasn't changed her diet, but states she eats fruits ad veggies when she can.  ? ? ?HPI: Susan Burch is a 66 y.o. female who presents to the office today to address the problems listed above in the chief complaint. ?Obesity: interested in help with weight loss. Works 12 hour shifts on the weekends. Sleeps well. +snores. No witnessed apnea. Little exercise. Skips meals and eats small portions. Typical american: lean proteins, potatoe/salads for dinner . Tyipcally skps lunch or dinner if working. Bagel or yogurt or pancakes for breakfast. ? Daily caloric intake, may be < 1500.  ?No MEN or thyroid problems.  ?Never has tried weight loss meds.  ?Nl a1c. ? ?Assessment  ?1. Obesity (BMI 30-39.9)   ? ?  ?Plan  ?obesity:  education done. [place]. Discussed exercise, calorie counting and may need to increase intake. Avoid skipping meals.  ? ?Follow up: Return in about 3 months (around 10/28/2021) for recheck.  ?Visit date not found ? ?No orders of the defined types were placed in this encounter. ? ?Meds ordered this encounter  ?Medications  ? WEGOVY 0.25 MG/0.5ML SOAJ  ?  Sig: Inject 0.25 mg into the skin once a week.  ?  Dispense:  2 mL  ?  Refill:  0  ? WEGOVY 0.5 MG/0.5ML SOAJ  ?  Sig: Inject 0.5 mg into the skin once a week.  ?  Dispense:  2 mL  ?  Refill:  1  ? ?  ? ?I reviewed the patients updated PMH, FH, and SocHx.  ?  ?Patient Active Problem List  ? Diagnosis Date Noted  ? Centrilobular emphysema ([place]) 12/11/2019  ?  Priority: High  ? Obesity (BMI 30-39.9) 06/12/2019  ?  Priority: High  ? Mixed hyperlipidemia 02/20/2019  ?  Priority: High  ? Situational mixed anxiety and depressive disorder 11/12/2018  ?  Priority: High  ? Polycythemia, mild, evaluation by Dr Irene Limbo 2020 09/05/2018  ?  Priority: High  ? Bilateral lower extremity edema 04/11/2018  ?  Priority:  High  ? Sleep initiation dysfunction 09/27/2011  ?  Priority: High  ? Hypothyroidism (acquired) 11/06/2006  ?  Priority: High  ? Former smoker, 32 pack year history, quit 2006 11/12/2018  ?  Priority: Medium   ? Episodic tension-type headache, not intractable 11/12/2018  ?  Priority: Medium   ? Mild persistent asthma 09/21/2016  ?  Priority: Medium   ? Postmenopausal disorder 02/14/2013  ?  Priority: Medium   ? GERD 11/06/2006  ?  Priority: Medium   ? Allergic rhinitis 11/06/2006  ?  Priority: Low  ? ?Current Meds  ?Medication Sig  ? albuterol (VENTOLIN HFA) 108 (90 Base) MCG/ACT inhaler INHALE 2 PUFFS BY MOUTH INTO LUNGS EVERY 6 HOURS AS NEEDED  ? Ascorbic Acid (VITAMIN C) 500 MG CAPS Take by mouth.  ? Budeson-Glycopyrrol-Formoterol (BREZTRI AEROSPHERE) 160-9-4.8 MCG/ACT AERO INHALE 2 PUFFS BY MOUTH INTO LUNGS IN THE MORNING AND AT BEDTIME  ? esomeprazole (NEXIUM) 20 MG capsule TAKE 1 CAPSULE BY MOUTH DAILY  ? montelukast (SINGULAIR) 10 MG tablet TAKE 1 TABLET BY MOUTH AT BEDTIME.  ? Multiple Vitamin (MULTIVITAMIN) tablet Take 1 tablet by mouth daily.  ? Peak Flow Meter DEVI Use as needed to check your breathing. Log your values  ? rosuvastatin (CRESTOR) 5  MG tablet TAKE 1 TABLET (5 MG TOTAL) BY MOUTH DAILY.  ? sertraline (ZOLOFT) 100 MG tablet Take 1 tablet (100 mg total) by mouth at bedtime.  ? SYNTHROID 125 MCG tablet Take 1 tablet (125 mcg total) by mouth daily before breakfast.  ? Vitamin D, Cholecalciferol, 50 MCG (2000 UT) CAPS Take by mouth.  ? WEGOVY 0.25 MG/0.5ML SOAJ Inject 0.25 mg into the skin once a week.  ? WEGOVY 0.5 MG/0.5ML SOAJ Inject 0.5 mg into the skin once a week.  ? Zinc 50 MG CAPS Take by mouth.  ? zolpidem (AMBIEN) 5 MG tablet TAKE 1 TABLET BY MOUTH AT BEDTIME AS NEEDED  ? ? ?Allergies: ?Patient is allergic to bactrim and penicillins. ?Family History: ?Patient family history includes Atrial fibrillation in her maternal uncle; Breast cancer in her cousin; Cancer in her cousin and maternal  aunt; Diabetes in her brother and maternal grandmother; Heart Problems in her maternal aunt and mother; High Cholesterol in her father; High blood pressure in her father and maternal uncle; Hypothyroidism in her daughter, maternal aunt, maternal uncle, mother, and son. ?Social History:  ?Patient  reports that she quit smoking about 17 years ago. Her smoking use included cigarettes. She has a 32.00 pack-year smoking history. She has never used smokeless tobacco. She reports current alcohol use of about 14.0 standard drinks per week. She reports that she does not use drugs. ? ?Review of Systems: ?Constitutional: Negative for fever malaise or anorexia ?Cardiovascular: negative for chest pain ?Respiratory: negative for SOB or persistent cough ?Gastrointestinal: negative for abdominal pain ? ?Objective  ?Vitals: BP 131/83   Pulse 72   Temp 97.8 ?F (36.6 ?C) (Temporal)   Ht '5\' 3"'$  (1.6 m)   Wt 209 lb 6.4 oz (95 kg)   SpO2 93%   BMI 37.09 kg/m?  ?General: no acute distress , A&Ox3 ? ?Commons side effects, risks, benefits, and alternatives for medications and treatment plan prescribed today were discussed, and the patient expressed understanding of the given instructions. Patient is instructed to call or message via MyChart if he/she has any questions or concerns regarding our treatment plan. No barriers to understanding were identified. We discussed Red Flag symptoms and signs in detail. Patient expressed understanding regarding what to do in case of urgent or emergency type symptoms.  ?Medication list was reconciled, printed and provided to the patient in AVS. Patient instructions and summary information was reviewed with the patient as documented in the AVS. ?This note was prepared with assistance of Systems analyst. Occasional wrong-word or sound-a-like substitutions may have occurred due to the inherent limitations of voice recognition software ? ?This visit occurred during the SARS-CoV-2 public  health emergency.  Safety protocols were in place, including screening questions prior to the visit, additional usage of staff PPE, and extensive cleaning of exam room while observing appropriate contact time as indicated for disinfecting solutions.  ? ?

## 2021-07-29 NOTE — Patient Instructions (Signed)
Please return in 3 months to recheck weight on medications ? ?If you have any questions or concerns, please don't hesitate to send me a message via MyChart or call the office at [phone]. Thank you for visiting with [place] today! It's our pleasure caring for you.  ?

## 2021-07-30 ENCOUNTER — Other Ambulatory Visit (HOSPITAL_COMMUNITY): Payer: Self-pay

## 2021-07-30 MED ORDER — SEMAGLUTIDE-WEIGHT MANAGEMENT 0.5 MG/0.5ML ~~LOC~~ SOAJ: SUBCUTANEOUS | 1 refills | Status: DC

## 2021-07-31 ENCOUNTER — Other Ambulatory Visit (HOSPITAL_COMMUNITY): Payer: Self-pay

## 2021-08-06 ENCOUNTER — Other Ambulatory Visit (HOSPITAL_COMMUNITY): Payer: Self-pay

## 2021-08-06 ENCOUNTER — Encounter: Payer: Self-pay | Admitting: Family Medicine

## 2021-08-09 ENCOUNTER — Other Ambulatory Visit (HOSPITAL_COMMUNITY): Payer: Self-pay

## 2021-08-09 MED ORDER — LIDOCAINE VISCOUS HCL 2 % MT SOLN
OROMUCOSAL | 2 refills | Status: DC
  Filled 2021-08-09: qty 120, 6d supply, fill #0
  Filled 2021-08-19 – 2021-08-23 (×2): qty 120, 6d supply, fill #1

## 2021-08-13 ENCOUNTER — Other Ambulatory Visit (HOSPITAL_COMMUNITY): Payer: Self-pay

## 2021-08-19 ENCOUNTER — Other Ambulatory Visit (HOSPITAL_COMMUNITY): Payer: Self-pay

## 2021-08-20 ENCOUNTER — Other Ambulatory Visit (HOSPITAL_COMMUNITY): Payer: Self-pay

## 2021-08-23 ENCOUNTER — Other Ambulatory Visit (HOSPITAL_COMMUNITY): Payer: Self-pay

## 2021-08-23 MED ORDER — DEXAMETHASONE 0.5 MG/5ML PO SOLN
ORAL | 0 refills | Status: DC
  Filled 2021-08-23: qty 240, 12d supply, fill #0

## 2021-08-27 ENCOUNTER — Other Ambulatory Visit (HOSPITAL_COMMUNITY): Payer: Self-pay

## 2021-08-31 ENCOUNTER — Ambulatory Visit: Payer: 59 | Admitting: Pulmonary Disease

## 2021-08-31 ENCOUNTER — Encounter: Payer: Self-pay | Admitting: Pulmonary Disease

## 2021-08-31 ENCOUNTER — Other Ambulatory Visit (HOSPITAL_COMMUNITY): Payer: Self-pay

## 2021-08-31 VITALS — BP 126/82 | HR 62 | Temp 97.9°F | Ht 62.5 in | Wt 205.8 lb

## 2021-08-31 DIAGNOSIS — J4530 Mild persistent asthma, uncomplicated: Secondary | ICD-10-CM | POA: Diagnosis not present

## 2021-08-31 DIAGNOSIS — J439 Emphysema, unspecified: Secondary | ICD-10-CM

## 2021-08-31 DIAGNOSIS — J432 Centrilobular emphysema: Secondary | ICD-10-CM | POA: Diagnosis not present

## 2021-08-31 MED ORDER — DEXAMETHASONE 0.5 MG/5ML PO SOLN
ORAL | 1 refills | Status: DC
  Filled 2021-08-31: qty 240, 14d supply, fill #0
  Filled 2021-09-01: qty 240, 9d supply, fill #0
  Filled 2021-10-15: qty 240, 9d supply, fill #1

## 2021-08-31 NOTE — Progress Notes (Signed)
? ?Subjective:  ? ? Patient ID: Susan Burch, female    DOB: 04-Feb-1956, 66 y.o.   MRN: [id number] ? ?HPI ?Chief Complaint  ?Patient presents with  ? Consult  ?  Breathing generally ok. With exercise, very SOB. No coughing or wheezing.  ? 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 ?She has been diagnosed with centrilobular emphysema on screening CT scan and is concerned about this diagnosis. ?She reports smoking 1/2 pack/day starting as a teenager until she quit in 2006, less than 20 pack years.  She reports a diagnosis of asthma for about 20 years for which she has been seeing Dr. Donneta Romberg, allergist used to be on Symbicort and was recently changed to [employer]. ?She reports shortness of breath especially on heavy exercise or when she is rushing a patient to the operating room for C-section ?She denies frequent chest colds or intermittent wheezing. ?She does have seasonal allergies ? ?I have reviewed PCP notes, previous low-dose CT chest and PFTs ? ? ?Significant tests/ events reviewed ? ?LDCT chest 10/2019 > mild emphysema, 36m nodules ? ?PFTs 05/2017 showed no evidence of airway obstruction, ratio 84, DLCO 93% ? ?Past Medical History:  ?Diagnosis Date  ? [place]  ? Mild noted on CT for lung cancer screen 2021; former long term smoker.  ? GERD without esophagitis   ? HLD (hyperlipidemia)   ? Hypothyroidism   ? Mixed hyperlipidemia 02/20/2019  ? Sleep initiation dysfunction 09/27/2011  ? ?Past Surgical History:  ?Procedure Laterality Date  ? BREAST EXCISIONAL BIOPSY Right 1990  ? [place] ? CHOLECYSTECTOMY  1998  ? MYOMECTOMY  1993  ? ? ?Allergies  ?Allergen Reactions  ? Bactrim   ? Penicillins   ? ? ?Social History  ? ?Socioeconomic History  ? Marital status: Married  ?  Spouse name: Not on file  ? Number of children: Not on file  ? Years of education: Not on file  ? Highest education level: Not on file  ?Occupational History  ?  Occupation: RTherapist, sports ?  Employer: [employer]  ?Tobacco Use  ? Smoking status: Former  ?  Packs/day: 1.00  ?  Years: 32.00  ?  Pack years: 32.00  ?  Types: Cigarettes  ?  Quit date: 2006  ?  Years since quitting: 17.3  ? Smokeless tobacco: Never  ?Vaping Use  ? Vaping Use: Never used  ?Substance and Sexual Activity  ? Alcohol use: Yes  ?  Alcohol/week: 14.0 standard drinks  ?  Types: 14 Glasses of wine per week  ?  Comment: 2 glasses per day  ? Drug use: Never  ? Sexual activity: Yes  ?Other Topics Concern  ? Not on file  ?Social History Narrative  ? Not on file  ? ?Social Determinants of Health  ? ?Financial Resource Strain: Not on file  ?Food Insecurity: Not on file  ?Transportation Needs: Not on file  ?Physical Activity: Not on file  ?Stress: Not on file  ?Social Connections: Not on file  ?Intimate Partner Violence: Not on file  ? ? ?Family History  ?Problem Relation Age of Onset  ? Heart Problems Mother   ? Hypothyroidism Mother   ? High blood pressure Father   ? High Cholesterol Father   ? Diabetes Brother   ? Diabetes Maternal Grandmother   ? High blood pressure Maternal Uncle   ? Atrial fibrillation Maternal Uncle   ? Hypothyroidism Maternal Uncle   ?  Heart Problems Maternal Aunt   ? Cancer Maternal Aunt   ? Hypothyroidism Maternal Aunt   ? Cancer Cousin   ? Breast cancer Cousin   ? Hypothyroidism Daughter   ? Hypothyroidism Son   ? ? ? ? ? ?Review of Systems ?Constitutional: negative for anorexia, fevers and sweats  ?Eyes: negative for irritation, redness and visual disturbance  ?Ears, nose, mouth, throat, and face: negative for earaches, epistaxis, nasal congestion and sore throat  ?Respiratory: negative for cough, dyspnea on exertion, sputum and wheezing  ?Cardiovascular: negative for chest pain, dyspnea, lower extremity edema, orthopnea, palpitations and syncope  ?Gastrointestinal: negative for abdominal pain, constipation, diarrhea, melena, nausea and vomiting  ?Genitourinary:negative for dysuria, frequency  and hematuria  ?Hematologic/lymphatic: negative for bleeding, easy bruising and lymphadenopathy  ?Musculoskeletal:negative for arthralgias, muscle weakness and stiff joints  ?Neurological: negative for coordination problems, gait problems, headaches and weakness  ?Endocrine: negative for diabetic symptoms including polydipsia, polyuria and weight loss ? ?   ?Objective:  ? Physical Exam ? ?Gen. Pleasant, well-nourished, in no distress, normal affect ?ENT - no pallor,icterus, no post nasal drip ?Neck: No JVD, no thyromegaly, no carotid bruits ?Lungs: no use of accessory muscles, no dullness to percussion, clear without rales or rhonchi  ?Cardiovascular: Rhythm regular, heart sounds  normal, no murmurs or gallops, no peripheral edema ?Abdomen: soft and non-tender, no hepatosplenomegaly, BS normal. ?Musculoskeletal: No deformities, no cyanosis or clubbing ?Neuro:  alert, non focal ? ? ? ?   ?Assessment & Plan:  ? ? ?

## 2021-08-31 NOTE — Patient Instructions (Signed)
?  X refer to lung cancer screening program ?

## 2021-08-31 NOTE — Assessment & Plan Note (Signed)
Noted on screening CT but does not appear to be significant on PFTs. ?She quit smoking in 2006 and does not seem to have frequent attacks of bronchitis or COPD flares. ?As such, I do not feel that this needs further work-up. ?She does have a previous history of asthma and can continue on a steroid/LABA combination. ?She has been able to obtain [facility] and can continue that if she prefers. ?I have asked her to enroll back in the lung cancer screening program if she qualifies ?

## 2021-08-31 NOTE — Assessment & Plan Note (Signed)
Continue steroid/LABA combination ?

## 2021-09-01 ENCOUNTER — Other Ambulatory Visit (HOSPITAL_COMMUNITY): Payer: Self-pay

## 2021-09-07 ENCOUNTER — Other Ambulatory Visit: Payer: Self-pay | Admitting: Family Medicine

## 2021-09-07 ENCOUNTER — Other Ambulatory Visit (HOSPITAL_COMMUNITY): Payer: Self-pay

## 2021-09-07 MED ORDER — WEGOVY 0.5 MG/0.5ML ~~LOC~~ SOAJ
0.5000 mg | SUBCUTANEOUS | 2 refills | Status: DC
  Filled 2021-09-07: qty 2, 28d supply, fill #0
  Filled 2021-10-27: qty 2, 28d supply, fill #1

## 2021-09-08 ENCOUNTER — Encounter: Payer: Self-pay | Admitting: Family Medicine

## 2021-09-08 ENCOUNTER — Other Ambulatory Visit (HOSPITAL_COMMUNITY): Payer: Self-pay

## 2021-09-08 MED ORDER — WEGOVY 0.25 MG/0.5ML ~~LOC~~ SOAJ
0.2500 mg | SUBCUTANEOUS | 0 refills | Status: DC
  Filled 2021-09-08: qty 2, 28d supply, fill #0

## 2021-09-09 ENCOUNTER — Other Ambulatory Visit (HOSPITAL_COMMUNITY): Payer: Self-pay

## 2021-09-10 ENCOUNTER — Other Ambulatory Visit (HOSPITAL_COMMUNITY): Payer: Self-pay

## 2021-09-16 ENCOUNTER — Other Ambulatory Visit (HOSPITAL_COMMUNITY): Payer: Self-pay

## 2021-09-21 ENCOUNTER — Other Ambulatory Visit: Payer: Self-pay | Admitting: Internal Medicine

## 2021-09-21 ENCOUNTER — Other Ambulatory Visit (HOSPITAL_COMMUNITY): Payer: Self-pay

## 2021-09-21 MED ORDER — SYNTHROID 125 MCG PO TABS
125.0000 ug | ORAL_TABLET | Freq: Every day | ORAL | 3 refills | Status: DC
  Filled 2021-09-21: qty 45, 45d supply, fill #0
  Filled 2021-10-22: qty 45, 45d supply, fill #1
  Filled 2021-12-23: qty 45, 45d supply, fill #2

## 2021-09-29 ENCOUNTER — Other Ambulatory Visit (HOSPITAL_COMMUNITY): Payer: Self-pay

## 2021-10-07 ENCOUNTER — Other Ambulatory Visit (HOSPITAL_COMMUNITY): Payer: Self-pay

## 2021-10-15 ENCOUNTER — Other Ambulatory Visit (HOSPITAL_COMMUNITY): Payer: Self-pay

## 2021-10-22 ENCOUNTER — Other Ambulatory Visit (HOSPITAL_COMMUNITY): Payer: Self-pay

## 2021-10-27 ENCOUNTER — Other Ambulatory Visit (HOSPITAL_COMMUNITY): Payer: Self-pay

## 2021-10-27 ENCOUNTER — Encounter: Payer: Self-pay | Admitting: Family Medicine

## 2021-10-27 ENCOUNTER — Ambulatory Visit: Payer: 59 | Admitting: Family Medicine

## 2021-10-27 VITALS — BP 120/70 | HR 71 | Temp 97.8°F | Ht 62.5 in | Wt 194.6 lb

## 2021-10-27 DIAGNOSIS — E669 Obesity, unspecified: Secondary | ICD-10-CM | POA: Diagnosis not present

## 2021-10-27 MED ORDER — WEGOVY 1 MG/0.5ML ~~LOC~~ SOAJ: 1.0000 mg | SUBCUTANEOUS | 2 refills | Status: DC

## 2021-10-27 NOTE — Progress Notes (Signed)
Subjective  CC:  Chief Complaint  Patient presents with   Follow-up    HPI: Susan Burch is a 66 y.o. female who presents to the office today to address the problems listed above in the chief complaint. Follow-up obesity: Susan Burch 2 months ago.  Tolerating the 0.5 mg weekly dose well.  Occasional mild nausea and intermittent loose stools but tolerating mostly very well.  Down over 10 pounds.  Feels good about it.  Would like to lose more weight.  BMI is currently 35. Wt Readings from Last 3 Encounters:  10/27/21 194 lb 9.6 oz (88.3 kg)  08/31/21 205 lb 12.8 oz (93.4 kg)  07/29/21 209 lb 6.4 oz (95 kg)    Assessment  1. Obesity (BMI 30-39.9)      Plan  Obesity: Responsive to [name].  Continue.  Will continue 0.5 mg weekly for another month and then titrate up to 1 mg weekly for 3 months.  Monitor weight loss.  Discussed possible side effects if loses weight too quickly.  Next visit we will need to recheck thyroid to see if dose adjustment is needed as well.  Follow up: 3 to 6 months Visit date not found  No orders of the defined types were placed in this encounter.  No orders of the defined types were placed in this encounter.     I reviewed the patients updated PMH, FH, and SocHx.    Patient Active Problem List   Diagnosis Date Noted   Centrilobular emphysema ([place]) 12/11/2019    Priority: High   Obesity (BMI 30-39.9) 06/12/2019    Priority: High   Mixed hyperlipidemia 02/20/2019    Priority: High   Situational mixed anxiety and depressive disorder 11/12/2018    Priority: High   Polycythemia, mild, evaluation by Dr Irene Limbo 2020 09/05/2018    Priority: High   Bilateral lower extremity edema 04/11/2018    Priority: High   Sleep initiation dysfunction 09/27/2011    Priority: High   Hypothyroidism (acquired) 11/06/2006    Priority: High   Former smoker, 32 pack year history, quit 2006 11/12/2018    Priority: Medium    Episodic tension-type headache, not  intractable 11/12/2018    Priority: Medium    Mild persistent asthma 09/21/2016    Priority: Medium    Postmenopausal disorder 02/14/2013    Priority: Medium    GERD 11/06/2006    Priority: Medium    Allergic rhinitis 11/06/2006    Priority: Low   Current Meds  Medication Sig   albuterol (VENTOLIN HFA) 108 (90 Base) MCG/ACT inhaler INHALE 2 PUFFS BY MOUTH INTO LUNGS EVERY 6 HOURS AS NEEDED   Ascorbic Acid (VITAMIN C) 500 MG CAPS Take by mouth.   dexamethasone (DECADRON) 0.5 MG/5ML solution Swish 5 mls by mouth for 2 minutes then spit. (Do Not Swallow, do not rinse) Use 4 to 5 times daily for 2 weeks.   esomeprazole (NEXIUM) 20 MG capsule TAKE 1 CAPSULE BY MOUTH DAILY   Multiple Vitamin (MULTIVITAMIN) tablet Take 1 tablet by mouth daily.   Peak Flow Meter DEVI Use as needed to check your breathing. Log your values   rosuvastatin (CRESTOR) 5 MG tablet TAKE 1 TABLET (5 MG TOTAL) BY MOUTH DAILY.   sertraline (ZOLOFT) 100 MG tablet Take 1 tablet (100 mg total) by mouth at bedtime.   SYNTHROID 125 MCG tablet Take 1 tablet (125 mcg total) by mouth daily before breakfast.   Vitamin D, Cholecalciferol, 50 MCG (2000 UT) CAPS Take  by mouth.   WEGOVY 0.5 MG/0.5ML SOAJ Inject 0.5 mg into the skin once a week.   Zinc 50 MG CAPS Take by mouth.   zolpidem (AMBIEN) 5 MG tablet TAKE 1 TABLET BY MOUTH AT BEDTIME AS NEEDED    Allergies: Patient is allergic to bactrim and penicillins. Family History: Patient family history includes Atrial fibrillation in her maternal uncle; Breast cancer in her cousin; Cancer in her cousin and maternal aunt; Diabetes in her brother and maternal grandmother; Heart Problems in her maternal aunt and mother; High Cholesterol in her father; High blood pressure in her father and maternal uncle; Hypothyroidism in her daughter, maternal aunt, maternal uncle, mother, and son. Social History:  Patient  reports that she quit smoking about 17 years ago. Her smoking use included  cigarettes. She has a 32.00 pack-year smoking history. She has never used smokeless tobacco. She reports current alcohol use of about 14.0 standard drinks of alcohol per week. She reports that she does not use drugs.  Review of Systems: Constitutional: Negative for fever malaise or anorexia Cardiovascular: negative for chest pain Respiratory: negative for SOB or persistent cough Gastrointestinal: negative for abdominal pain  Objective  Vitals: BP 120/70   Pulse 71   Temp 97.8 F (36.6 C)   Ht 5' 2.5" (1.588 m)   Wt 194 lb 9.6 oz (88.3 kg)   SpO2 96%   BMI 35.03 kg/m  General: no acute distress , A&Ox3   Commons side effects, risks, benefits, and alternatives for medications and treatment plan prescribed today were discussed, and the patient expressed understanding of the given instructions. Patient is instructed to call or message via MyChart if he/she has any questions or concerns regarding our treatment plan. No barriers to understanding were identified. We discussed Red Flag symptoms and signs in detail. Patient expressed understanding regarding what to do in case of urgent or emergency type symptoms.  Medication list was reconciled, printed and provided to the patient in AVS. Patient instructions and summary information was reviewed with the patient as documented in the AVS. This note was prepared with assistance of Dragon voice recognition software. Occasional wrong-word or sound-a-like substitutions may have occurred due to the inherent limitations of voice recognition software  This visit occurred during the SARS-CoV-2 public health emergency.  Safety protocols were in place, including screening questions prior to the visit, additional usage of staff PPE, and extensive cleaning of exam room while observing appropriate contact time as indicated for disinfecting solutions.

## 2021-10-27 NOTE — Patient Instructions (Signed)
Please return in 3-6 months for weight loss recheck as needed. We will recheck your thyroid then as well.   If you have any questions or concerns, please don't hesitate to send me a message via MyChart or call the office at [phone]. Thank you for visiting with [place] today! It's our pleasure caring for you.

## 2021-10-28 DIAGNOSIS — L299 Pruritus, unspecified: Secondary | ICD-10-CM | POA: Diagnosis not present

## 2021-10-28 DIAGNOSIS — L400 Psoriasis vulgaris: Secondary | ICD-10-CM | POA: Diagnosis not present

## 2021-11-24 ENCOUNTER — Other Ambulatory Visit (HOSPITAL_COMMUNITY): Payer: Self-pay

## 2021-11-24 MED ORDER — SEMAGLUTIDE-WEIGHT MANAGEMENT 1 MG/0.5ML ~~LOC~~ SOAJ
SUBCUTANEOUS | 2 refills | Status: DC
  Filled 2021-11-24 – 2021-11-30 (×2): qty 2, 28d supply, fill #0

## 2021-11-25 DIAGNOSIS — Z1211 Encounter for screening for malignant neoplasm of colon: Secondary | ICD-10-CM | POA: Diagnosis not present

## 2021-11-25 DIAGNOSIS — E785 Hyperlipidemia, unspecified: Secondary | ICD-10-CM | POA: Diagnosis not present

## 2021-11-25 DIAGNOSIS — E669 Obesity, unspecified: Secondary | ICD-10-CM | POA: Diagnosis not present

## 2021-11-25 DIAGNOSIS — G4700 Insomnia, unspecified: Secondary | ICD-10-CM | POA: Diagnosis not present

## 2021-11-25 DIAGNOSIS — E039 Hypothyroidism, unspecified: Secondary | ICD-10-CM | POA: Diagnosis not present

## 2021-11-25 DIAGNOSIS — R194 Change in bowel habit: Secondary | ICD-10-CM | POA: Diagnosis not present

## 2021-11-30 ENCOUNTER — Other Ambulatory Visit (HOSPITAL_COMMUNITY): Payer: Self-pay

## 2021-11-30 ENCOUNTER — Encounter: Payer: Self-pay | Admitting: Family Medicine

## 2021-12-01 ENCOUNTER — Other Ambulatory Visit (HOSPITAL_COMMUNITY): Payer: Self-pay

## 2021-12-01 MED ORDER — WEGOVY 1.7 MG/0.75ML ~~LOC~~ SOAJ
1.7000 mg | SUBCUTANEOUS | 5 refills | Status: DC
  Filled 2021-12-01: qty 3, 28d supply, fill #0

## 2021-12-02 ENCOUNTER — Other Ambulatory Visit (HOSPITAL_COMMUNITY): Payer: Self-pay

## 2021-12-03 ENCOUNTER — Other Ambulatory Visit (HOSPITAL_COMMUNITY): Payer: Self-pay

## 2021-12-03 MED ORDER — CLENPIQ 10-3.5-12 MG-GM -GM/175ML PO SOLN
ORAL | 0 refills | Status: DC
  Filled 2021-12-03: qty 350, 1d supply, fill #0

## 2021-12-16 ENCOUNTER — Other Ambulatory Visit (HOSPITAL_COMMUNITY): Payer: Self-pay

## 2021-12-16 ENCOUNTER — Other Ambulatory Visit: Payer: Self-pay | Admitting: Family Medicine

## 2021-12-16 DIAGNOSIS — G4700 Insomnia, unspecified: Secondary | ICD-10-CM

## 2021-12-16 MED ORDER — ZOLPIDEM TARTRATE 5 MG PO TABS
ORAL_TABLET | Freq: Every evening | ORAL | 1 refills | Status: DC | PRN
  Filled 2021-12-16: qty 90, 90d supply, fill #0
  Filled ????-??-??: fill #0

## 2021-12-17 DIAGNOSIS — Z1211 Encounter for screening for malignant neoplasm of colon: Secondary | ICD-10-CM | POA: Diagnosis not present

## 2021-12-17 DIAGNOSIS — D128 Benign neoplasm of rectum: Secondary | ICD-10-CM | POA: Diagnosis not present

## 2021-12-17 DIAGNOSIS — K621 Rectal polyp: Secondary | ICD-10-CM | POA: Diagnosis not present

## 2021-12-18 ENCOUNTER — Encounter: Payer: Self-pay | Admitting: Family Medicine

## 2021-12-20 ENCOUNTER — Other Ambulatory Visit (HOSPITAL_COMMUNITY): Payer: Self-pay

## 2021-12-20 ENCOUNTER — Encounter: Payer: Self-pay | Admitting: Family Medicine

## 2021-12-20 DIAGNOSIS — G4700 Insomnia, unspecified: Secondary | ICD-10-CM

## 2021-12-21 ENCOUNTER — Other Ambulatory Visit (HOSPITAL_COMMUNITY): Payer: Self-pay

## 2021-12-21 MED ORDER — ZOLPIDEM TARTRATE 5 MG PO TABS
5.0000 mg | ORAL_TABLET | Freq: Every evening | ORAL | 5 refills | Status: DC | PRN
  Filled 2021-12-21: qty 60, 30d supply, fill #0
  Filled 2022-02-07: qty 60, 30d supply, fill #1
  Filled 2022-03-09: qty 60, 30d supply, fill #2
  Filled 2022-04-07: qty 60, 30d supply, fill #3

## 2021-12-22 ENCOUNTER — Other Ambulatory Visit (HOSPITAL_COMMUNITY): Payer: Self-pay

## 2021-12-23 ENCOUNTER — Other Ambulatory Visit (HOSPITAL_COMMUNITY): Payer: Self-pay

## 2022-01-13 DIAGNOSIS — L400 Psoriasis vulgaris: Secondary | ICD-10-CM | POA: Diagnosis not present

## 2022-01-13 DIAGNOSIS — Z111 Encounter for screening for respiratory tuberculosis: Secondary | ICD-10-CM | POA: Diagnosis not present

## 2022-01-17 ENCOUNTER — Encounter: Payer: Self-pay | Admitting: *Deleted

## 2022-01-27 ENCOUNTER — Encounter: Payer: Self-pay | Admitting: Family Medicine

## 2022-01-27 ENCOUNTER — Other Ambulatory Visit (HOSPITAL_COMMUNITY): Payer: Self-pay

## 2022-01-27 ENCOUNTER — Ambulatory Visit: Payer: 59 | Admitting: Family Medicine

## 2022-01-27 VITALS — BP 130/82 | HR 69 | Temp 98.2°F | Ht 62.5 in | Wt 182.6 lb

## 2022-01-27 DIAGNOSIS — F4323 Adjustment disorder with mixed anxiety and depressed mood: Secondary | ICD-10-CM | POA: Diagnosis not present

## 2022-01-27 DIAGNOSIS — E039 Hypothyroidism, unspecified: Secondary | ICD-10-CM

## 2022-01-27 DIAGNOSIS — E669 Obesity, unspecified: Secondary | ICD-10-CM | POA: Diagnosis not present

## 2022-01-27 DIAGNOSIS — Z1231 Encounter for screening mammogram for malignant neoplasm of breast: Secondary | ICD-10-CM

## 2022-01-27 LAB — TSH: TSH: 0.11 u[IU]/mL — ABNORMAL LOW (ref 0.35–5.50)

## 2022-01-27 MED ORDER — SERTRALINE HCL 100 MG PO TABS: 50.0000 mg | ORAL_TABLET | Freq: Every day | ORAL | 3 refills | Status: DC

## 2022-01-27 MED ORDER — SEMAGLUTIDE-WEIGHT MANAGEMENT 1 MG/0.5ML ~~LOC~~ SOAJ
1.0000 mg | SUBCUTANEOUS | 3 refills | Status: DC
  Filled 2022-01-27: qty 2, 28d supply, fill #0
  Filled 2022-03-31: qty 2, 28d supply, fill #1
  Filled 2022-05-10: qty 2, 28d supply, fill #2
  Filled 2022-07-11 – 2022-07-25 (×3): qty 2, 28d supply, fill #3

## 2022-01-27 MED ORDER — SEMAGLUTIDE-WEIGHT MANAGEMENT 1 MG/0.5ML ~~LOC~~ SOAJ
SUBCUTANEOUS | 3 refills | Status: DC
  Filled 2022-01-27: qty 6, fill #0

## 2022-01-27 MED ORDER — WEGOVY 1.7 MG/0.75ML ~~LOC~~ SOAJ
1.7000 mg | SUBCUTANEOUS | 5 refills | Status: DC
  Filled 2022-01-27 – 2022-04-26 (×2): qty 3, 28d supply, fill #0
  Filled 2022-06-24: qty 3, 28d supply, fill #1
  Filled 2022-08-09: qty 3, 28d supply, fill #2

## 2022-01-27 NOTE — Progress Notes (Signed)
Subjective  CC:  Chief Complaint  Patient presents with   Weight Loss    Pt here to F/U with wt loss     HPI: Susan Burch is a 66 y.o. female who presents to the office today to address the problems listed above in the chief complaint. Obesity: down 25 pounds since starting wegovy. Had to increase to the 1.'7mg'$  dose since hard to find the '1mg'$ . Doing well with weight loss but feeling  a bit more nausea and fatigued on this dose. No constipation or abdominal pain.   Depression: she did cut back on her zoloft dose thinking this could be contributing to her fatigue. Her mood is stable but it hasn't improved the fatigue Hypothyroidism: tired w/o worsening edema or skin/hair changes. Compliant with meds   Wt Readings from Last 3 Encounters:  01/27/22 182 lb 9.6 oz (82.8 kg)  10/27/21 194 lb 9.6 oz (88.3 kg)  08/31/21 205 lb 12.8 oz (93.4 kg)    Assessment  1. Obesity (BMI 30-39.9)   2. Hypothyroidism (acquired)   3. Situational mixed anxiety and depressive disorder      Plan  obesity:  excellent response to meds. Will see if she can get the '1mg'$  wegovy dose; otherwise will continue with the 1.7. discussed ensuring healthy nutrition and adding a mvi daily. Monitor side effects Low thyroid: recheck Tsh and adjust levo dose if indicated. Now on 153mg daily.  Mood: monitor on lower dose of zoloft '50mg'$  daily.  Overdue for mammo: discussed. Pt to schedule. Order placed  Follow up: 6 mo for cpe  Visit date not found  Orders Placed This Encounter  Procedures   TSH   Meds ordered this encounter  Medications   Semaglutide-Weight Management (WEGOVY) 1.7 MG/0.75ML SOAJ    Sig: Inject 1.7 mg into the skin once a week.    Dispense:  3 mL    Refill:  5   DISCONTD: Semaglutide-Weight Management 1 MG/0.5ML SOAJ    Sig: Inject 1 pen (1 MG)  into the skin once a week    Dispense:  6 mL    Refill:  3   Semaglutide-Weight Management 1 MG/0.5ML SOAJ    Sig: Inject 1 pen (1 MG)  into the skin  once a week    Dispense:  6 mL    Refill:  3      I reviewed the patients updated PMH, FH, and SocHx.    Patient Active Problem List   Diagnosis Date Noted   Centril[place]ular [place]ysema ([place] 12/11/2019    Priority: High   Obesity (BMI 30-39.9) 06/12/2019    Priority: High   Mixed hyperlipidemia 02/20/2019    Priority: High   Situational mixed anxiety and depressive disorder 11/12/2018    Priority: High   Polycythemia, mild, evaluation by Dr KIrene Limbo2020 09/05/2018    Priority: High   Bilateral lower extremity edema 04/11/2018    Priority: High   Sleep initiation dysfunction 09/27/2011    Priority: High   Hypothyroidism (acquired) 11/06/2006    Priority: High   Former smoker, 32 pack year history, quit 2006 11/12/2018    Priority: Medium    Episodic tension-type headache, not intractable 11/12/2018    Priority: Medium    Mild persistent asthma 09/21/2016    Priority: Medium    Postmenopausal disorder 02/14/2013    Priority: Medium    GERD 11/06/2006    Priority: Medium    Allergic rhinitis 11/06/2006    Priority: Low  Current Meds  Medication Sig   albuterol (VENTOLIN HFA) 108 (90 Base) MCG/ACT inhaler INHALE 2 PUFFS BY MOUTH INTO LUNGS EVERY 6 HOURS AS NEEDED   Ascorbic Acid (VITAMIN C) 500 MG CAPS Take by mouth.   dexamethasone (DECADRON) 0.5 MG/5ML solution Swish 5 mls by mouth for 2 minutes then spit. (Do Not Swallow, do not rinse) Use 4 to 5 times daily for 2 weeks.   esomeprazole (NEXIUM) 20 MG capsule TAKE 1 CAPSULE BY MOUTH DAILY   Multiple Vitamin (MULTIVITAMIN) tablet Take 1 tablet by mouth daily.   Peak Flow Meter DEVI Use as needed to check your breathing. Log your values   rosuvastatin (CRESTOR) 5 MG tablet TAKE 1 TABLET (5 MG TOTAL) BY MOUTH DAILY.   Semaglutide-Weight Management (WEGOVY) 1.7 MG/0.75ML SOAJ Inject 1.7 mg into the skin once a week.   sertraline (ZOLOFT) 100 MG tablet Take 1 tablet (100 mg total) by mouth at bedtime.   Sod Picosulfate-Mag  Ox-Cit Acd (CLENPIQ) 10-3.5-12 MG-GM -GM/175ML SOLN Use as directed   SYNTHROID 125 MCG tablet Take 1 tablet (125 mcg total) by mouth daily before breakfast.   Vitamin D, Cholecalciferol, 50 MCG (2000 UT) CAPS Take by mouth.   Zinc 50 MG CAPS Take by mouth.   zolpidem (AMBIEN) 5 MG tablet Take 1-2 tablets (5-10 mg total) by mouth at bedtime as needed.   [DISCONTINUED] Semaglutide-Weight Management (WEGOVY) 1.7 MG/0.75ML SOAJ Inject 1.7 mg into the skin once a week.   [DISCONTINUED] Semaglutide-Weight Management 1 MG/0.5ML SOAJ Inject 1 pen (1 MG)  into the skin once a week   [DISCONTINUED] Semaglutide-Weight Management 1 MG/0.5ML SOAJ Inject 1 pen (1 MG)  into the skin once a week    Allergies: Patient is allergic to bactrim and penicillins. Family History: Patient family history includes Atrial fibrillation in her maternal uncle; Breast cancer in her cousin; Cancer in her cousin and maternal aunt; Diabetes in her brother and maternal grandmother; Heart Problems in her maternal aunt and mother; High Cholesterol in her father; High blood pressure in her father and maternal uncle; Hypothyroidism in her daughter, maternal aunt, maternal uncle, mother, and son. Social History:  Patient  reports that she quit smoking about 17 years ago. Her smoking use included cigarettes. She has a 32.00 pack-year smoking history. She has never used smokeless tobacco. She reports current alcohol use of about 14.0 standard drinks of alcohol per week. She reports that she does not use drugs.  Review of Systems: Constitutional: Negative for fever malaise or anorexia Cardiovascular: negative for chest pain Respiratory: negative for SOB or persistent cough Gastrointestinal: negative for abdominal pain  Objective  Vitals: BP 130/82   Pulse 69   Temp 98.2 F (36.8 C)   Ht 5' 2.5" (1.588 m)   Wt 182 lb 9.6 oz (82.8 kg)   SpO2 93%   BMI 32.87 kg/m  General: no acute distress but appears fatigued,  A&Ox3 Cardiovascular:  RRR without murmur or gallop.  Respiratory:  Good breath sounds bilaterally, CTAB with normal respiratory effort Skin:  Warm, no rashes    Commons side effects, risks, benefits, and alternatives for medications and treatment plan prescribed today were discussed, and the patient expressed understanding of the given instructions. Patient is instructed to call or message via MyChart if he/she has any questions or concerns regarding our treatment plan. No barriers to understanding were identified. We discussed Red Flag symptoms and signs in detail. Patient expressed understanding regarding what to do in case of urgent  or emergency type symptoms.  Medication list was reconciled, printed and provided to the patient in AVS. Patient instructions and summary information was reviewed with the patient as documented in the AVS. This note was prepared with assistance of Dragon voice recognition software. Occasional wrong-word or sound-a-like substitutions may have occurred due to the inherent limitations of voice recognition software  This visit occurred during the SARS-CoV-2 public health emergency.  Safety protocols were in place, including screening questions prior to the visit, additional usage of staff PPE, and extensive cleaning of exam room while observing appropriate contact time as indicated for disinfecting solutions.

## 2022-01-27 NOTE — Patient Instructions (Signed)
Please return in 6 months for your annual complete physical; please come fasting. Sooner if needed for weight loss management.   If you have any questions or concerns, please don't hesitate to send me a message via MyChart or call the office at [phone]. Thank you for visiting with [place] today! It's our pleasure caring for you.

## 2022-01-28 ENCOUNTER — Other Ambulatory Visit (HOSPITAL_COMMUNITY): Payer: Self-pay

## 2022-01-28 MED ORDER — LEVOTHYROXINE SODIUM 112 MCG PO TABS
112.0000 ug | ORAL_TABLET | Freq: Every day | ORAL | 3 refills | Status: DC
  Filled 2022-01-28: qty 90, 90d supply, fill #0

## 2022-01-28 NOTE — Addendum Note (Signed)
Addended by: Billey Chang on: 01/28/2022 01:03 PM   Modules accepted: Orders

## 2022-01-29 ENCOUNTER — Other Ambulatory Visit (HOSPITAL_COMMUNITY): Payer: Self-pay

## 2022-02-01 ENCOUNTER — Other Ambulatory Visit (HOSPITAL_COMMUNITY): Payer: Self-pay

## 2022-02-01 ENCOUNTER — Telehealth: Payer: Self-pay

## 2022-02-01 NOTE — Telephone Encounter (Signed)
PA for Wegovy has been approved. 

## 2022-02-03 ENCOUNTER — Other Ambulatory Visit (HOSPITAL_COMMUNITY): Payer: Self-pay

## 2022-02-04 ENCOUNTER — Other Ambulatory Visit (HOSPITAL_COMMUNITY): Payer: Self-pay

## 2022-02-07 ENCOUNTER — Other Ambulatory Visit (HOSPITAL_COMMUNITY): Payer: Self-pay

## 2022-02-09 ENCOUNTER — Other Ambulatory Visit (HOSPITAL_COMMUNITY): Payer: Self-pay

## 2022-02-22 ENCOUNTER — Other Ambulatory Visit (HOSPITAL_COMMUNITY): Payer: Self-pay

## 2022-02-23 ENCOUNTER — Other Ambulatory Visit (HOSPITAL_COMMUNITY): Payer: Self-pay

## 2022-03-02 ENCOUNTER — Other Ambulatory Visit (HOSPITAL_COMMUNITY): Payer: Self-pay

## 2022-03-04 ENCOUNTER — Ambulatory Visit: Payer: 59

## 2022-03-09 ENCOUNTER — Other Ambulatory Visit (HOSPITAL_COMMUNITY): Payer: Self-pay

## 2022-03-24 ENCOUNTER — Ambulatory Visit: Payer: 59

## 2022-03-29 NOTE — Progress Notes (Unsigned)
Patient ID: Susan Burch, female   DOB: 11/04/1955, 66 y.o.   MRN: [id number]   HPI  Susan Burch is a 66 y.o.-year-old female, returning for follow-up for uncontrolled, acquired, hypothyroidism.  Last visit 1 year ago.  Interim history: She feels well at this visit, without complaints.  She was able to lose a significant amount of weight since last visit, more than 30 pounds-she is on Wegovy.  Reviewed history: Pt. has been dx with hypothyroidism in 1997-1998 after the birth of her son.  She was started on Synthroid then.  When I first saw her, she was on 175 mcg daily, however, she was not taking the medication correctly.  We were able to decrease the dose after she started to take it correctly.  Pt is on Synthroid d.a.w.  137 mcg daily (dose increased 03/2020), taken: - in am - fasting - at least 30 min from b'fast and coffee + creamer - no Ca, Fe - + MVI at night, PPIs (Nexium) at lunchtime - not on Biotin  Reviewed her TFTs: Lab Results  Component Value Date   TSH 0.11 (L) 01/27/2022   TSH 1.31 05/06/2021   TSH 0.10 (L) 03/25/2021   TSH 0.62 05/20/2020   TSH 6.48 (H) 04/02/2020   TSH 14.68 (H) 02/12/2020   TSH 3.24 02/13/2019   TSH 1.05 06/05/2018   TSH 0.77 02/15/2018   TSH 1.08 11/15/2017   FREET4 1.02 05/06/2021   FREET4 1.16 03/25/2021   FREET4 1.01 05/20/2020   FREET4 0.84 04/02/2020   FREET4 0.71 02/12/2020   FREET4 0.96 02/13/2019   FREET4 1.03 06/05/2018   FREET4 1.12 02/15/2018   FREET4 1.07 11/15/2017   FREET4 1.24 09/28/2017   T3FREE 3.5 05/30/2017   Pt denies: - feeling nodules in neck - hoarseness - choking She had occasional dysphagia due to GERD >> resolved on Nexium.  She has + FH of thyroid disorders in: M and M aunt and uncle. Also all her children - hypothyroidism, M aunt. No FH of thyroid cancer. No h/o radiation tx to head or neck. No herbal supplements. No Biotin use. On vitamin C, Zn, vitamin D 2000 units daily.  She was getting steroid  injections for psoriasis of scalp.  Not recently. Pt. also has a history of asthma with cough.  She gets as needed steroids - no recent courses. She also has polycythemia. In 2021 she was diagnosed with mild emphysema on a chest CT.   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  ROS: + see HPI  I reviewed pt's medications, allergies, PMH, social hx, family hx, and changes were documented in the history of present illness. Otherwise, unchanged from my initial visit note.  Past Medical History:  Diagnosis Date   Centrilobular emphysema ([place]) 12/11/2019   Mild noted on CT for lung cancer screen 2021; former long term smoker.   GERD without esophagitis    HLD (hyperlipidemia)    Hypothyroidism    Mixed hyperlipidemia 02/20/2019   Sleep initiation dysfunction 09/27/2011   Past Surgical History:  Procedure Laterality Date   BREAST EXCISIONAL BIOPSY Right Kn[place]   Social History   Socioeconomic History   Marital status: Married    Spouse name: Not on file   Number of children: 2          Occupational History   RN   Smoking status: Former Smoker  Packs/day: 0.50    Years: 4.00    Pack years: 2.00    Types: Cigarettes    Last attempt to quit: 07/12/2004    Years since quitting: 13.1   Smokeless tobacco: Never Used  Substance and Sexual Activity   Alcohol use: Yes    Alcohol/week: 8.4 oz    Types: 14 Glasses of red wine per week    Comment: 2 glasses per day   Drug use: No   Current Outpatient Medications on File Prior to Visit  Medication Sig Dispense Refill   albuterol (VENTOLIN HFA) 108 (90 Base) MCG/ACT inhaler INHALE 2 PUFFS BY MOUTH INTO LUNGS EVERY 6 HOURS AS NEEDED 18 g 1   Ascorbic Acid (VITAMIN C) 500 MG CAPS Take by mouth.     dexamethasone (DECADRON) 0.5 MG/5ML solution Swish 5 mls by mouth for 2 minutes then spit. (Do Not Swallow, do not rinse) Use 4 to 5 times daily for 2 weeks. 240 mL 1    esomeprazole (NEXIUM) 20 MG capsule TAKE 1 CAPSULE BY MOUTH DAILY 90 capsule 3   levothyroxine (SYNTHROID) 112 MCG tablet Take 1 tablet (112 mcg total) by mouth daily. 90 tablet 3   Multiple Vitamin (MULTIVITAMIN) tablet Take 1 tablet by mouth daily.     Peak Flow Meter DEVI Use as needed to check your breathing. Log your values 1 each 0   rosuvastatin (CRESTOR) 5 MG tablet TAKE 1 TABLET (5 MG TOTAL) BY MOUTH DAILY. 90 tablet 3   Semaglutide-Weight Management (WEGOVY) 1.7 MG/0.75ML SOAJ Inject 1.7 mg into the skin once a week. 3 mL 5   Semaglutide-Weight Management 1 MG/0.5ML SOAJ Inject 1 mg into the skin once a week. 6 mL 3   sertraline (ZOLOFT) 100 MG tablet Take 1 tablet (100 mg total) by mouth at bedtime. 90 tablet 3   sertraline (ZOLOFT) 100 MG tablet Take 0.5 tablets (50 mg total) by mouth at bedtime. 90 tablet 3   Sod Picosulfate-Mag Ox-Cit Acd (CLENPIQ) 10-3.5-12 MG-GM -GM/175ML SOLN Use as directed 350 mL 0   Vitamin D, Cholecalciferol, 50 MCG (2000 UT) CAPS Take by mouth.     Zinc 50 MG CAPS Take by mouth.     zolpidem (AMBIEN) 5 MG tablet Take 1-2 tablets (5-10 mg total) by mouth at bedtime as needed. 60 tablet 5   No current facility-administered medications on file prior to visit.   Allergies  Allergen Reactions   Bactrim    Penicillins    FH: Diabetes in brother, MGM HTN in father, maternal uncle HL in father Heart disease in mother, maternal aunt, maternal uncle Thyroid problems-see HPI Cancer in maternal aunt and first cousin: Multiple myeloma, lymphoma, respectively  PE: There were no vitals taken for this visit. Wt Readings from Last 3 Encounters:  01/27/22 182 lb 9.6 oz (82.8 kg)  10/27/21 194 lb 9.6 oz (88.3 kg)  08/31/21 205 lb 12.8 oz (93.4 kg)   Constitutional: overweight, in NAD Eyes: EOMI, no exophthalmos ENT: no thyromegaly, no cervical lymphadenopathy Cardiovascular: RRR, No MRG Respiratory: CTA B Musculoskeletal: no deformities Skin: no  rashes Neurological: no tremor with outstretched hands  ASSESSMENT: 1. Hypothyroidism  PLAN:  1. Patient with longstanding acquired hypothyroidism, on levothyroxine therapy.  TSH levels have been fluctuating in the past most likely due to incorrect levothyroxine dosing.  We separated multivitamins and coffee from levothyroxine and we were able to back off her levothyroxine dose afterwards. -At last visit, her TSH was suppressed so we decreased the  dose, however, latest TSH was still low: Lab Results  Component Value Date   TSH 0.11 (L) 01/27/2022  - she continues on LT4 112 mcg daily, last dose decrease on 01/27/2022 - pt feels good on this dose.  At last visit she was interested in weight loss and I advised her to discuss with PCP and possibly go to the [facility] weight management clinic.  Since last visit, she lost 32 pounds -on [facility]!! (From 214 lbs >> **), which is likely the reason why her levothyroxine requirement decreased. - we discussed about taking the thyroid hormone every day, with water, >30 minutes before breakfast, separated by >4 hours from acid reflux medications, calcium, iron, multivitamins. Pt. is taking it correctly. - will check thyroid tests today: TSH and fT4 - If labs are abnormal, she will need to return for repeat TFTs in 1.5 months -Otherwise, we will see her back in a year but with labs at 6 months or sooner  Needs refills.  Philemon Kingdom, MD PhD [facility]

## 2022-03-30 ENCOUNTER — Encounter: Payer: Self-pay | Admitting: Internal Medicine

## 2022-03-30 ENCOUNTER — Ambulatory Visit (INDEPENDENT_AMBULATORY_CARE_PROVIDER_SITE_OTHER): Payer: 59 | Admitting: Internal Medicine

## 2022-03-30 ENCOUNTER — Other Ambulatory Visit (HOSPITAL_COMMUNITY): Payer: Self-pay

## 2022-03-30 VITALS — BP 118/78 | HR 75 | Ht 62.5 in | Wt 188.6 lb

## 2022-03-30 DIAGNOSIS — E039 Hypothyroidism, unspecified: Secondary | ICD-10-CM

## 2022-03-30 LAB — T4, FREE: Free T4: 1.07 ng/dL (ref 0.60–1.60)

## 2022-03-30 LAB — TSH: TSH: 0.25 u[IU]/mL — ABNORMAL LOW (ref 0.35–5.50)

## 2022-03-30 MED ORDER — LEVOTHYROXINE SODIUM 100 MCG PO TABS
100.0000 ug | ORAL_TABLET | Freq: Every day | ORAL | 5 refills | Status: DC
  Filled 2022-03-30: qty 45, 45d supply, fill #0
  Filled 2022-05-11: qty 45, 45d supply, fill #1
  Filled 2022-06-24: qty 45, 45d supply, fill #2
  Filled 2022-06-27: qty 30, 30d supply, fill #2
  Filled 2022-07-27: qty 30, 30d supply, fill #3
  Filled 2022-08-25: qty 30, 30d supply, fill #4
  Filled 2022-09-23: qty 30, 30d supply, fill #5
  Filled 2022-10-21: qty 30, 30d supply, fill #6
  Filled 2022-11-24: qty 30, 30d supply, fill #7

## 2022-03-30 NOTE — Patient Instructions (Addendum)
Please stop at the lab.  Please continue Synthroid 112 mcg daily.  Take the thyroid hormone every day, with water, at least 30 minutes before breakfast, separated by at least 4 hours from: - acid reflux medications - calcium - iron - multivitamins  Please come back for a follow-up appointment in 1 year, but for labs in 6 months.

## 2022-03-31 ENCOUNTER — Other Ambulatory Visit (HOSPITAL_COMMUNITY): Payer: Self-pay

## 2022-04-05 ENCOUNTER — Ambulatory Visit: Payer: 59

## 2022-04-05 IMAGING — CT CT CHEST LUNG CANCER SCREENING LOW DOSE W/O CM
2 of 5 series · 15 of 40 positions shown, 18 images · non-contrast
Comparison: 10/24/2018

CLINICAL DATA: Thirty pack-year smoking history, quitting 15 years
ago.

EXAM:
CT CHEST WITHOUT CONTRAST LOW-DOSE FOR LUNG CANCER SCREENING
TECHNIQUE: Multidetector CT imaging of the chest was performed following the
standard protocol without IV contrast.

[Series 4: lung 1.00 br44 cor · coronal · 0.63mm/px · 3 of 456 slices shown]
[im 92/456  lung]
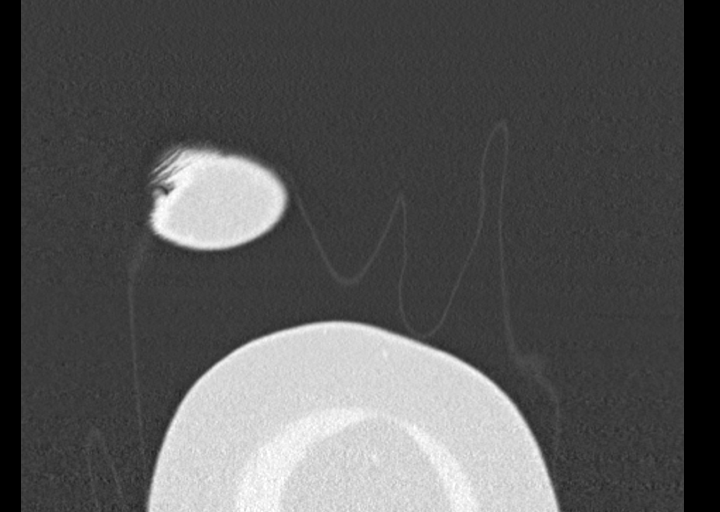
[im 183/456  lung]
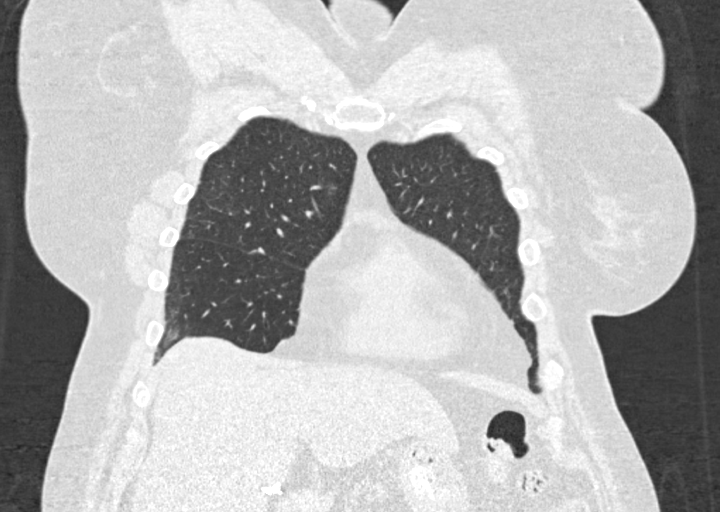
[im 274/456  lung]
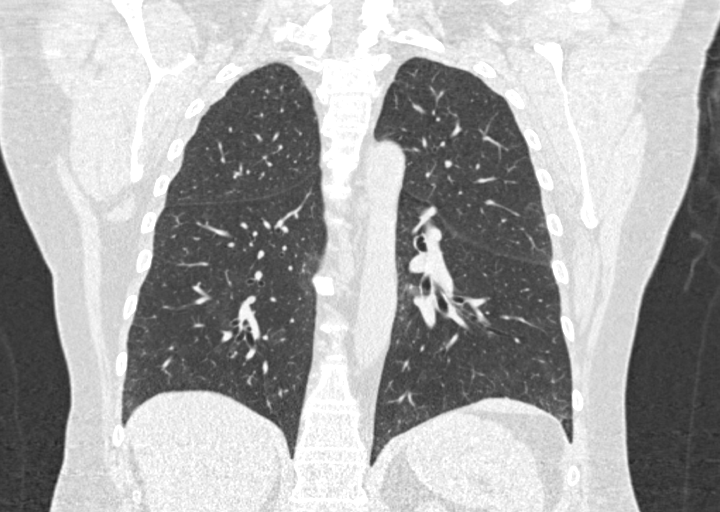

[Series 9: lung 1.00 br60 axial · axial · 0.89mm/px · z∈[-1122,-828]mm · 12 of 324 slices shown, 15 images]
[im 15/324  mediastinal]
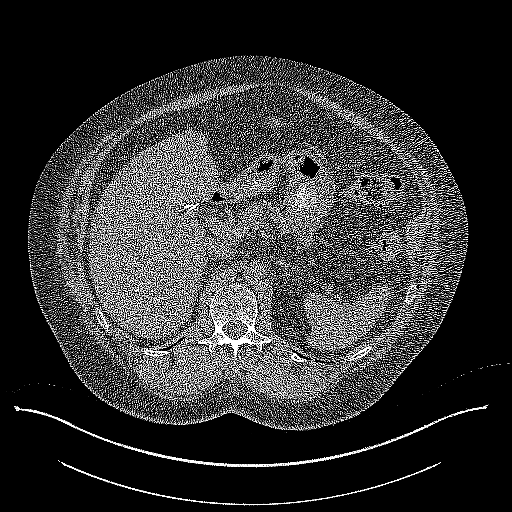
[im 15/324  lung]
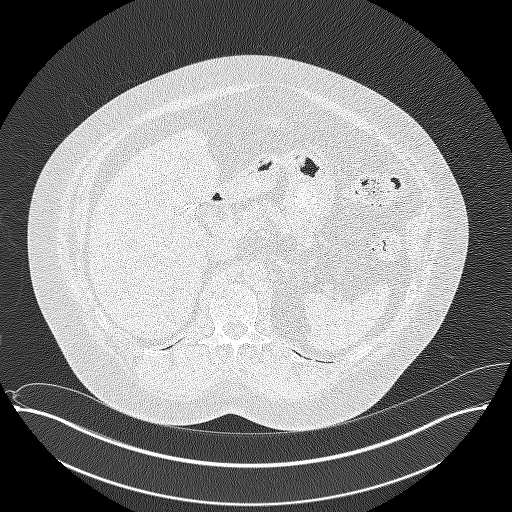
[im 45/324  lung]
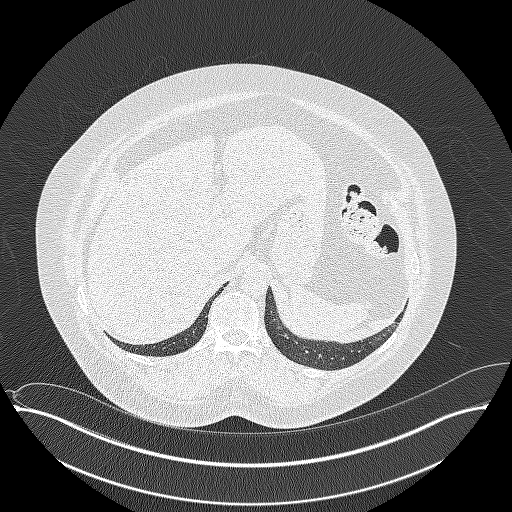
[im 74/324  lung]
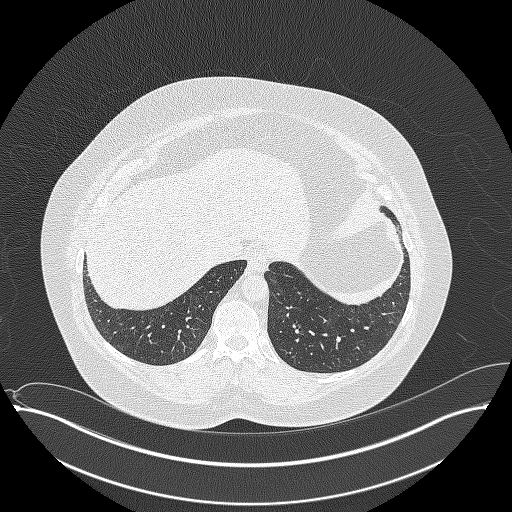
[im 103/324  lung]
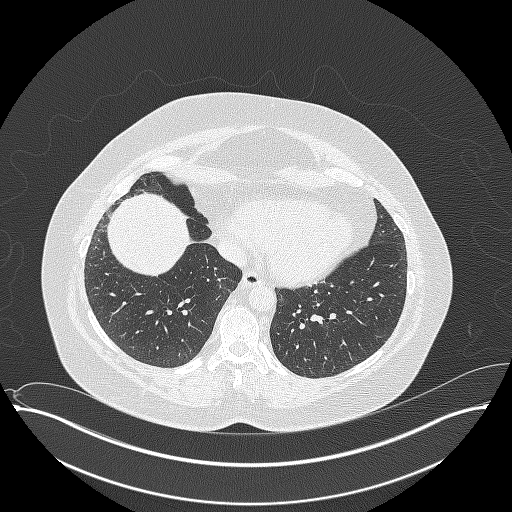
[im 118/324  mediastinal]
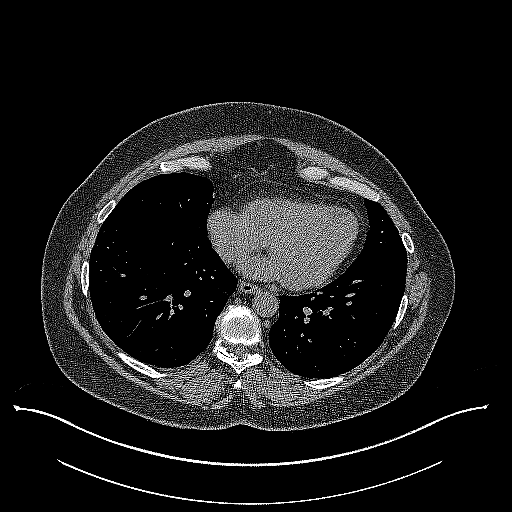
[im 118/324  lung]
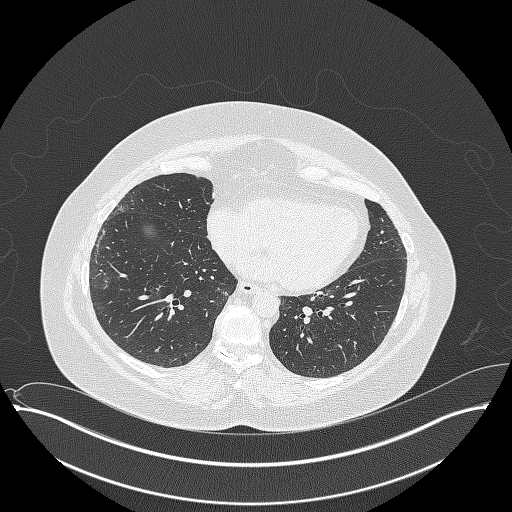
[im 147/324  lung]
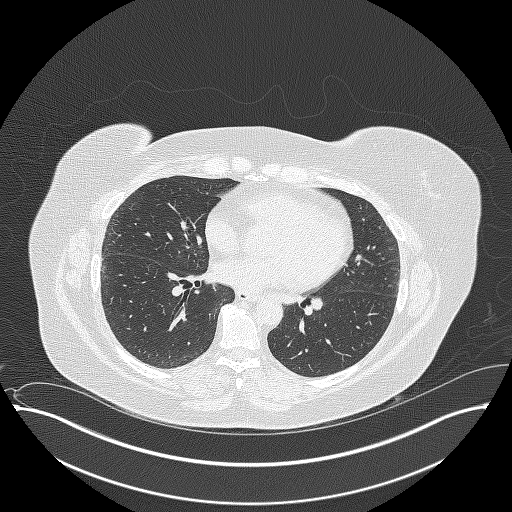
[im 177/324  lung]
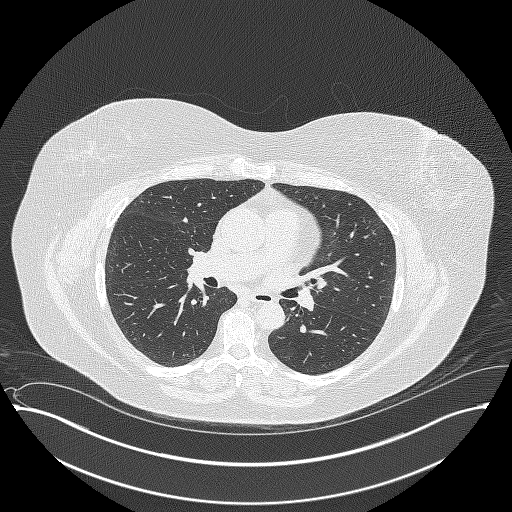
[im 206/324  lung]
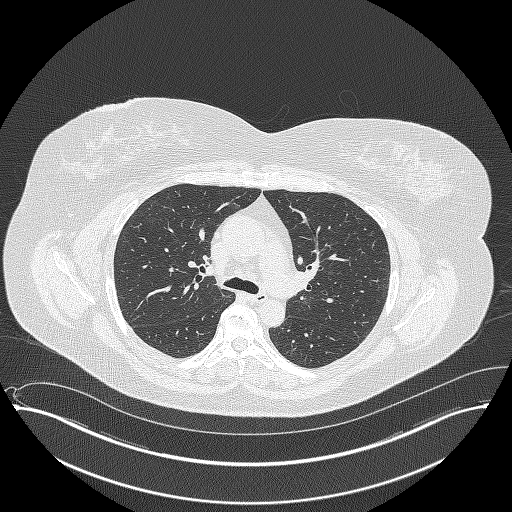
[im 221/324  mediastinal]
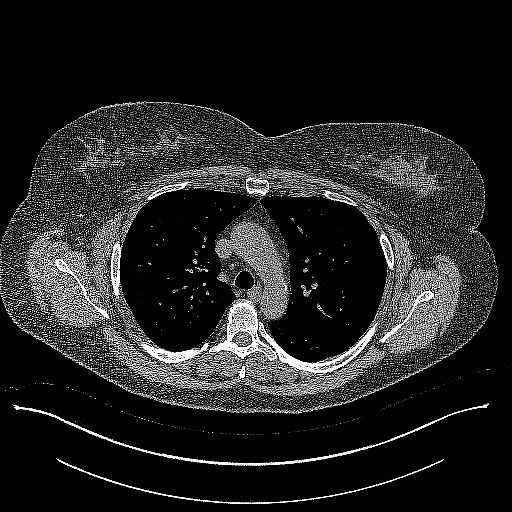
[im 221/324  lung]
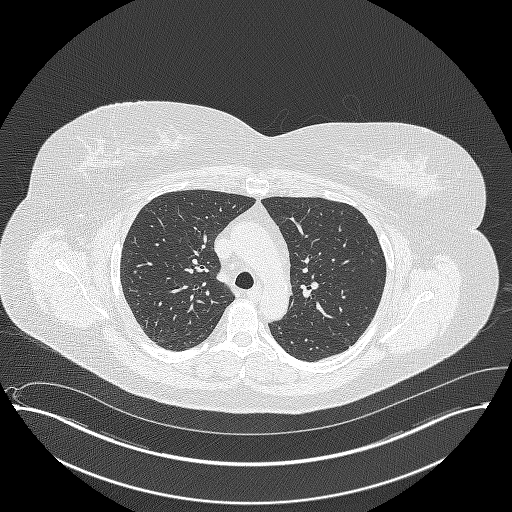
[im 250/324  lung]
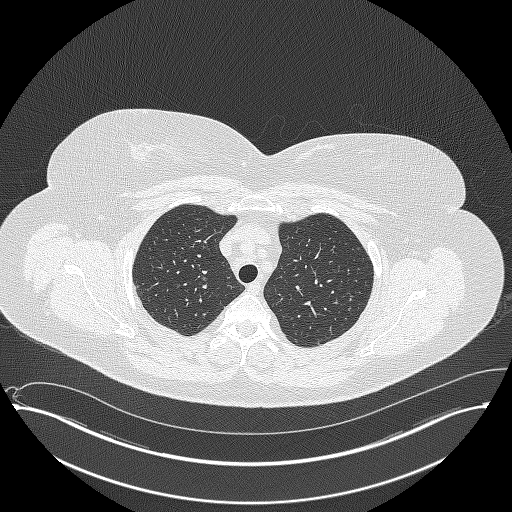
[im 279/324  lung]
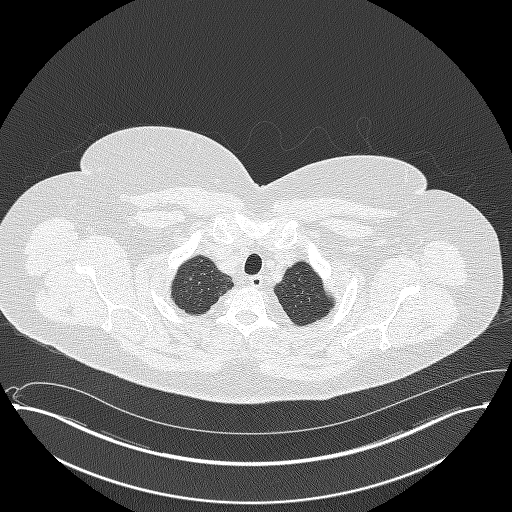
[im 309/324  lung]
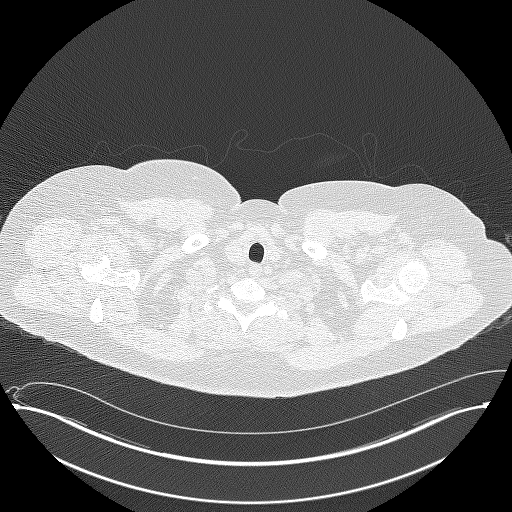

[15 of 40 positions shown; findings below may reference images not displayed]

FINDINGS: Cardiovascular: Aortic atherosclerosis. Tortuous thoracic aorta.
Normal heart size, without pericardial effusion.

Mediastinum/Nodes: No mediastinal or definite hilar adenopathy,
given limitations of unenhanced CT.

Lungs/Pleura: No pleural fluid. Mild centrilobular emphysema.
Bilateral pulmonary nodules of maximally volume derived equivalent
diameter 3.2 mm.

Upper Abdomen: Mild hepatic steatosis. Cholecystectomy. Normal
imaged portions of the spleen, stomach, pancreas

Musculoskeletal: Lower cervical and upper thoracic spondylosis.
IMPRESSION: 1. Lung-RADS 2, benign appearance or behavior. Continue annual
screening with low-dose chest CT without contrast in 12 months.
2. Hepatic steatosis.
3. Aortic Atherosclerosis (CKC7J-M7Q.Q) and Emphysema ([id number].Y).

## 2022-04-07 ENCOUNTER — Other Ambulatory Visit (HOSPITAL_COMMUNITY): Payer: Self-pay

## 2022-04-07 ENCOUNTER — Encounter: Payer: Self-pay | Admitting: *Deleted

## 2022-04-11 ENCOUNTER — Other Ambulatory Visit: Payer: Self-pay | Admitting: Family Medicine

## 2022-04-11 ENCOUNTER — Other Ambulatory Visit (HOSPITAL_COMMUNITY): Payer: Self-pay

## 2022-04-11 DIAGNOSIS — G4700 Insomnia, unspecified: Secondary | ICD-10-CM

## 2022-04-11 MED ORDER — ZOLPIDEM TARTRATE 5 MG PO TABS
5.0000 mg | ORAL_TABLET | Freq: Every evening | ORAL | 5 refills | Status: DC | PRN
  Filled 2022-04-11 – 2022-04-13 (×2): qty 60, 30d supply, fill #0

## 2022-04-12 ENCOUNTER — Other Ambulatory Visit: Payer: Self-pay

## 2022-04-12 ENCOUNTER — Encounter: Payer: Self-pay | Admitting: Family Medicine

## 2022-04-12 ENCOUNTER — Other Ambulatory Visit (HOSPITAL_COMMUNITY): Payer: Self-pay

## 2022-04-12 DIAGNOSIS — G4700 Insomnia, unspecified: Secondary | ICD-10-CM

## 2022-04-13 ENCOUNTER — Other Ambulatory Visit (HOSPITAL_COMMUNITY): Payer: Self-pay

## 2022-04-13 MED ORDER — ZOLPIDEM TARTRATE 5 MG PO TABS
5.0000 mg | ORAL_TABLET | Freq: Every evening | ORAL | 5 refills | Status: DC | PRN
  Filled 2022-04-13: qty 60, 30d supply, fill #0
  Filled 2022-05-09: qty 30, 30d supply, fill #1
  Filled 2022-05-10: qty 60, 30d supply, fill #1
  Filled 2022-06-09: qty 60, 30d supply, fill #2
  Filled 2022-07-11: qty 60, 30d supply, fill #3
  Filled 2022-08-09: qty 60, 30d supply, fill #4
  Filled 2022-09-08 (×2): qty 60, 30d supply, fill #5

## 2022-04-14 ENCOUNTER — Other Ambulatory Visit (HOSPITAL_COMMUNITY): Payer: Self-pay

## 2022-04-26 ENCOUNTER — Other Ambulatory Visit (HOSPITAL_COMMUNITY): Payer: Self-pay

## 2022-04-26 ENCOUNTER — Other Ambulatory Visit: Payer: Self-pay

## 2022-04-27 ENCOUNTER — Other Ambulatory Visit (HOSPITAL_COMMUNITY): Payer: Self-pay

## 2022-04-27 ENCOUNTER — Other Ambulatory Visit: Payer: Self-pay

## 2022-05-03 ENCOUNTER — Other Ambulatory Visit (HOSPITAL_COMMUNITY): Payer: Self-pay

## 2022-05-04 ENCOUNTER — Other Ambulatory Visit (HOSPITAL_COMMUNITY): Payer: Self-pay

## 2022-05-09 ENCOUNTER — Other Ambulatory Visit (HOSPITAL_COMMUNITY): Payer: Self-pay

## 2022-05-10 ENCOUNTER — Other Ambulatory Visit: Payer: Self-pay

## 2022-05-10 ENCOUNTER — Other Ambulatory Visit (HOSPITAL_COMMUNITY): Payer: Self-pay

## 2022-05-11 ENCOUNTER — Other Ambulatory Visit (HOSPITAL_COMMUNITY): Payer: Self-pay

## 2022-05-12 ENCOUNTER — Other Ambulatory Visit: Payer: Self-pay

## 2022-05-17 ENCOUNTER — Other Ambulatory Visit (HOSPITAL_COMMUNITY): Payer: Self-pay

## 2022-05-17 ENCOUNTER — Other Ambulatory Visit: Payer: Self-pay | Admitting: Family Medicine

## 2022-05-17 DIAGNOSIS — E782 Mixed hyperlipidemia: Secondary | ICD-10-CM

## 2022-05-17 MED ORDER — ROSUVASTATIN CALCIUM 5 MG PO TABS
5.0000 mg | ORAL_TABLET | Freq: Every day | ORAL | 3 refills | Status: DC
  Filled 2022-05-17: qty 90, 90d supply, fill #0

## 2022-05-18 ENCOUNTER — Other Ambulatory Visit (HOSPITAL_COMMUNITY): Payer: Self-pay

## 2022-05-18 ENCOUNTER — Other Ambulatory Visit: Payer: Self-pay

## 2022-05-26 ENCOUNTER — Other Ambulatory Visit: Payer: Self-pay

## 2022-05-30 ENCOUNTER — Ambulatory Visit: Payer: Commercial Managed Care - PPO | Admitting: Family Medicine

## 2022-05-30 ENCOUNTER — Other Ambulatory Visit: Payer: Self-pay

## 2022-06-01 ENCOUNTER — Ambulatory Visit: Payer: 59

## 2022-06-02 ENCOUNTER — Telehealth: Payer: Self-pay

## 2022-06-02 NOTE — Telephone Encounter (Signed)
PA for Susan Burch was done back in 10/23 and you were approved from 10/23-10/24.   (Key: [id number])

## 2022-06-07 ENCOUNTER — Other Ambulatory Visit (HOSPITAL_COMMUNITY): Payer: Self-pay

## 2022-06-07 ENCOUNTER — Other Ambulatory Visit: Payer: Self-pay

## 2022-06-09 ENCOUNTER — Ambulatory Visit
Admission: RE | Admit: 2022-06-09 | Discharge: 2022-06-09 | Disposition: A | Discharge status: Home / Self Care | Payer: Commercial Managed Care - PPO | Source: Ambulatory Visit | Attending: Family Medicine | Admitting: Family Medicine

## 2022-06-09 ENCOUNTER — Other Ambulatory Visit (HOSPITAL_COMMUNITY): Payer: Self-pay

## 2022-06-09 DIAGNOSIS — Z1231 Encounter for screening mammogram for malignant neoplasm of breast: Secondary | ICD-10-CM

## 2022-06-24 ENCOUNTER — Other Ambulatory Visit (HOSPITAL_COMMUNITY): Payer: Self-pay

## 2022-06-26 ENCOUNTER — Encounter: Payer: Self-pay | Admitting: Family Medicine

## 2022-06-27 ENCOUNTER — Other Ambulatory Visit (HOSPITAL_COMMUNITY): Payer: Self-pay

## 2022-06-27 ENCOUNTER — Other Ambulatory Visit: Payer: Self-pay

## 2022-07-01 ENCOUNTER — Other Ambulatory Visit (HOSPITAL_COMMUNITY): Payer: Self-pay

## 2022-07-01 ENCOUNTER — Other Ambulatory Visit: Payer: Self-pay

## 2022-07-11 ENCOUNTER — Other Ambulatory Visit (HOSPITAL_COMMUNITY): Payer: Self-pay

## 2022-07-12 ENCOUNTER — Other Ambulatory Visit (HOSPITAL_COMMUNITY): Payer: Self-pay

## 2022-07-13 ENCOUNTER — Encounter (HOSPITAL_COMMUNITY): Payer: Self-pay

## 2022-07-13 ENCOUNTER — Other Ambulatory Visit (HOSPITAL_COMMUNITY): Payer: Self-pay

## 2022-07-20 ENCOUNTER — Other Ambulatory Visit (HOSPITAL_COMMUNITY): Payer: Self-pay

## 2022-07-27 ENCOUNTER — Encounter: Payer: Self-pay | Admitting: Family Medicine

## 2022-07-27 ENCOUNTER — Other Ambulatory Visit (HOSPITAL_COMMUNITY): Payer: Self-pay

## 2022-07-27 ENCOUNTER — Ambulatory Visit: Payer: Commercial Managed Care - PPO | Admitting: Family Medicine

## 2022-07-27 VITALS — BP 110/70 | HR 68 | Temp 97.7°F | Ht 62.5 in | Wt 189.2 lb

## 2022-07-27 DIAGNOSIS — G4700 Insomnia, unspecified: Secondary | ICD-10-CM | POA: Diagnosis not present

## 2022-07-27 DIAGNOSIS — E782 Mixed hyperlipidemia: Secondary | ICD-10-CM

## 2022-07-27 DIAGNOSIS — E039 Hypothyroidism, unspecified: Secondary | ICD-10-CM | POA: Diagnosis not present

## 2022-07-27 DIAGNOSIS — D751 Secondary polycythemia: Secondary | ICD-10-CM

## 2022-07-27 DIAGNOSIS — F411 Generalized anxiety disorder: Secondary | ICD-10-CM

## 2022-07-27 DIAGNOSIS — J432 Centrilobular emphysema: Secondary | ICD-10-CM

## 2022-07-27 DIAGNOSIS — Z0000 Encounter for general adult medical examination without abnormal findings: Secondary | ICD-10-CM

## 2022-07-27 LAB — CBC WITH DIFFERENTIAL/PLATELET
Basophils Absolute: 0 10*3/uL (ref 0.0–0.1)
Basophils Relative: 0.6 % (ref 0.0–3.0)
Eosinophils Absolute: 0.2 10*3/uL (ref 0.0–0.7)
Eosinophils Relative: 3.9 % (ref 0.0–5.0)
HCT: 42.5 % (ref 36.0–46.0)
Hemoglobin: 14.7 g/dL (ref 12.0–15.0)
Lymphocytes Relative: 16.5 % (ref 12.0–46.0)
Lymphs Abs: 1 10*3/uL (ref 0.7–4.0)
MCHC: 34.6 g/dL (ref 30.0–36.0)
MCV: 94.2 fl (ref 78.0–100.0)
Monocytes Absolute: 0.7 10*3/uL (ref 0.1–1.0)
Monocytes Relative: 10.5 % (ref 3.0–12.0)
Neutro Abs: 4.2 10*3/uL (ref 1.4–7.7)
Neutrophils Relative %: 68.5 % (ref 43.0–77.0)
Platelets: 218 10*3/uL (ref 150.0–400.0)
RBC: 4.51 Mil/uL (ref 3.87–5.11)
RDW: 13.8 % (ref 11.5–15.5)
WBC: 6.2 10*3/uL (ref 4.0–10.5)

## 2022-07-27 LAB — COMPREHENSIVE METABOLIC PANEL
ALT: 21 U/L (ref 0–35)
AST: 22 U/L (ref 0–37)
Albumin: 4.2 g/dL (ref 3.5–5.2)
Alkaline Phosphatase: 75 U/L (ref 39–117)
BUN: 16 mg/dL (ref 6–23)
CO2: 26 mEq/L (ref 19–32)
Calcium: 9.2 mg/dL (ref 8.4–10.5)
Chloride: 106 mEq/L (ref 96–112)
Creatinine, Ser: 0.96 mg/dL (ref 0.40–1.20)
GFR: 62.33 mL/min (ref >60.00)
Glucose, Bld: 102 mg/dL — ABNORMAL HIGH (ref 70–99)
Potassium: 3.8 mEq/L (ref 3.5–5.1)
Sodium: 140 mEq/L (ref 135–145)
Total Bilirubin: 0.6 mg/dL (ref 0.2–1.2)
Total Protein: 6.1 g/dL (ref 6.0–8.3)

## 2022-07-27 LAB — TSH: TSH: 1.78 u[IU]/mL (ref 0.35–5.50)

## 2022-07-27 LAB — LIPID PANEL
Cholesterol: 223 mg/dL — ABNORMAL HIGH (ref 0–200)
HDL: 65.6 mg/dL (ref >39.00)
NonHDL: 157.38
Total CHOL/HDL Ratio: 3
Triglycerides: 244 mg/dL — ABNORMAL HIGH (ref 0.0–149.0)
VLDL: 48.8 mg/dL — ABNORMAL HIGH (ref 0.0–40.0)

## 2022-07-27 LAB — LDL CHOLESTEROL, DIRECT: Direct LDL: 114 mg/dL

## 2022-07-27 MED ORDER — SERTRALINE HCL 100 MG PO TABS: 100.0000 mg | ORAL_TABLET | Freq: Every day | ORAL | 3 refills | Status: DC

## 2022-07-27 NOTE — Patient Instructions (Signed)

## 2022-07-27 NOTE — Progress Notes (Signed)
Subjective  Chief Complaint  Patient presents with   Annual Exam    Pt here for Annual Exam and is not currently fasting    HPI: Susan Burch is a 67 y.o. female who presents to [facility] today for a Female Wellness Visit. She also has the concerns and/or needs as listed above in the chief complaint. These will be addressed in addition to the Health Maintenance Visit.   Wellness Visit: annual visit with health maintenance review and exam without Pap  HM: screens are current. Pap due next year. Feeling well. Imms up to date. No new concerns Chronic disease f/u and/or acute problem visit: (deemed necessary to be done in addition to the wellness visit): Acquired hypothyroidism: Reviewed endocrinology recent notes.  Levothyroxine dose was reduced to 100 mcg daily and she is now due for recheck.  She feels well without symptoms of low or high thyroid.  She is compliant with medications. History of polycythemia: Reviewed hematology notes.  Most recent hemoglobin was normal.  She does endorse snoring but does not feel tired, negative Epworth screen.  She does have mild emphysematous changes on her CT scan.  This could be contributing to her mild polycythemia.  She defers sleep study for now. Continues to use Ambien for sleep.  Works well Chronic anxiety, now well-controlled on sertraline 100 mg daily.  Has been on this for several years.  Works very well.  In retrospect, endorses chronic anxiety symptoms for most of her life.  No adverse effects  Assessment  1. Annual physical exam   2. Hypothyroidism (acquired)   3. Polycythemia, mild, evaluation by Dr Irene Limbo 2020   4. Mixed hyperlipidemia   5. Centrilobular emphysema   6. Sleep initiation dysfunction   7. GAD (generalized anxiety disorder)      Plan  Female Wellness Visit: Age appropriate Health Maintenance and Prevention measures were discussed with patient. Included topics are cancer screening  recommendations, ways to keep healthy (see AVS) including dietary and exercise recommendations, regular eye and dental care, use of seat belts, and avoidance of moderate alcohol use and tobacco use.  To see dermatology in May for annual skin check BMI: discussed patient's BMI and encouraged positive lifestyle modifications to help get to or maintain a target BMI. HM needs and immunizations were addressed and ordered. See below for orders. See HM and immunization section for updates. Routine labs and screening tests ordered including cmp, cbc and lipids where appropriate. Discussed recommendations regarding Vit D and calcium supplementation (see AVS)  Chronic disease management visit and/or acute problem visit: Recheck thyroid levels and adjust levothyroxine as needed.  Currently on levothyroxine 100 mcg daily.  Will forward results to Dr. Cruzita Lederer Polycythemia: Recheck today. No symptoms of emphysema, former smoker.  No longer qualifies for lung cancer screening. Sleep initiation dysfunction on Ambien, well-controlled continue 10 daily Hyperlipidemia, nonfasting for recheck today on Crestor 5 mg nightly. Generalized anxiety disorder: Well-controlled on sertraline 100 mg daily.  Follow up: 12 months for complete physical Orders Placed This Encounter  Procedures   CBC with Differential/Platelet   Comprehensive metabolic panel   Lipid panel   TSH   Meds ordered this encounter  Medications   sertraline (ZOLOFT) 100 MG tablet    Sig: Take 1 tablet (100 mg total) by mouth at bedtime.    Dispense:  90 tablet    Refill:  3      Body mass index is 34.05 kg/m. Wt Readings  from Last 3 Encounters:  07/27/22 189 lb 3.2 oz (85.8 kg)  03/30/22 188 lb 9.6 oz (85.5 kg)  01/27/22 182 lb 9.6 oz (82.8 kg)     Patient Active Problem List   Diagnosis Date Noted   Centrilobular emphysema 12/11/2019    Priority: High    Mild noted on CT for lung cancer screen 2021; former long term smoker.     Obesity (BMI 30-39.9) 06/12/2019    Priority: High   Mixed hyperlipidemia 02/20/2019    Priority: High   Situational mixed anxiety and depressive disorder 11/12/2018    Priority: High   Polycythemia, mild, evaluation by Dr Irene Limbo 2020 09/05/2018    Priority: High    Evaluation by Dr. Irene Limbo 2020.  -Discussed patient's most recent labs from 06/05/18, HGB at 16.2 and HCT at 47.8. Other blood counts normal. GFR slightly low at 53.31, other chemistries are normal. -Reviewed previous labs; earliest available HGB from 05/26/15 was at 15.6 with a HCT of 46.3. 09/21/16 HGB at 15.5. 05/30/17 HGB at 15.6. -Discussed that the patient's polycythemia is borderline, and after looking at the trend over the last 3.5 years, she has not had progression nor other blood count abnormalities. Do not suspect primary bone marrow disorder such as polycythemia vera. -Discussed that her mild polycythemia is likely secondary from hemo-concentration from diuretics vs lower oxygen levels due to asthma vs lower oxygen levels due to sleep apnea vs fluctuating thyroid levels -Recommend pursuing sleep study with PCP to rule out sleep apnea, which could be a cause of mild polycythemia, and pt does endorse snoring 2024: pt declines sleep study. Hgb has improved as well    Bilateral lower extremity edema 04/11/2018    Priority: High    Echocardiogram [id number]: mild diastolic dysfunction; nl EF. Mild LVH, prn lasix after long shifts at work    Sleep initiation dysfunction 09/27/2011    Priority: High   Hypothyroidism (acquired) 11/06/2006    Priority: High    Managed by Dr. Cruzita Lederer     Former smoker, 32 pack year history, quit 2006 11/12/2018    Priority: Medium    Episodic tension-type headache, not intractable 11/12/2018    Priority: Medium    Mild persistent asthma 09/21/2016    Priority: Medium     Has seen Dr. Donneta Romberg in the past    Postmenopausal disorder 02/14/2013    Priority: Medium    GERD 11/06/2006    Priority:  Medium    Allergic rhinitis 11/06/2006    Priority: Low   GAD (generalized anxiety disorder) 07/27/2022   Health Maintenance  Topic Date Due   COVID-19 Vaccine (4 - 2023-24 season) 08/12/2022 (Originally 12/24/2021)   INFLUENZA VACCINE  11/24/2022   PAP SMEAR-Modifier  06/06/2023   MAMMOGRAM  06/10/2023   COLONOSCOPY (Pts 45-5yrs Insurance coverage will need to be confirmed)  11/15/2028   DTaP/Tdap/Td (3 - Td or Tdap) 07/23/2031   Hepatitis C Screening  Completed   HIV Screening  Completed   Zoster Vaccines- Shingrix  Completed   HPV VACCINES  Aged Out   Immunization History  Administered Date(s) Administered   Influenza, Quadrivalent, Recombinant, Inj, Pf 01/21/2019   Influenza-Unspecified 01/23/2014, 12/25/2014, 01/23/2018, 01/24/2020, 01/23/2021   PFIZER(Purple Top)SARS-COV-2 Vaccination 04/20/2019, 05/09/2019, 02/01/2020   Pneumococcal Conjugate-13 11/28/2006   Pneumococcal Polysaccharide-23 02/14/2013   Td 07/03/2006   Tdap 07/22/2021   Zoster Recombinat (Shingrix) 01/21/2019, 06/19/2020   We updated and reviewed the patient's past history in detail and it is documented below. Allergies:  Patient is allergic to bactrim and penicillins. Past Medical History Patient  has a past medical history of Centrilobular emphysema (12/11/2019), GERD without esophagitis, HLD (hyperlipidemia), Hypothyroidism, Mixed hyperlipidemia (02/20/2019), and Sleep initiation dysfunction (09/27/2011). Past Surgical History Patient  has a past surgical history that includes Breast excisional biopsy (Right, 1990); Myomectomy (1993); Cholecystectomy (1998); and Cesarean section (1994, 1996). Family History: Patient family history includes Atrial fibrillation in her maternal uncle; Breast cancer in her cousin; Cancer in her cousin and maternal aunt; Diabetes in her brother and maternal grandmother; Heart Problems in her maternal aunt and mother; High Cholesterol in her father; High blood pressure in her father  and maternal uncle; Hypothyroidism in her daughter, maternal aunt, maternal uncle, mother, and son. Social History:  Patient  reports that she quit smoking about 18 years ago. Her smoking use included cigarettes. She has a 32.00 pack-year smoking history. She has never used smokeless tobacco. She reports current alcohol use of about 14.0 standard drinks of alcohol per week. She reports that she does not use drugs.  Review of Systems: Constitutional: negative for fever or malaise Ophthalmic: negative for photophobia, double vision or loss of vision Cardiovascular: negative for chest pain, dyspnea on exertion, or new LE swelling Respiratory: negative for SOB or persistent cough Gastrointestinal: negative for abdominal pain, change in bowel habits or melena Genitourinary: negative for dysuria or gross hematuria, no abnormal uterine bleeding or disharge Musculoskeletal: negative for new gait disturbance or muscular weakness Integumentary: negative for new or persistent rashes, no breast lumps Neurological: negative for TIA or stroke symptoms Psychiatric: negative for SI or delusions Allergic/Immunologic: negative for hives  Patient Care Team    Relationship Specialty Notifications Start End  Leamon Arnt, MD PCP - General Family Medicine  02/20/19   Lyndal Pulley, DO Consulting Physician Family Medicine  04/11/18   Philemon Kingdom, MD Consulting Physician Endocrinology  04/11/18   Mosetta Anis, MD Referring Physician Allergy  04/11/18   Servando Salina, MD Consulting Physician Obstetrics and Gynecology  04/11/18   Philemon Kingdom, MD Consulting Physician Endocrinology  07/27/22     Objective  Vitals: BP 110/70   Pulse 68   Temp 97.7 F (36.5 C)   Ht 5' 2.5" (1.588 m)   Wt 189 lb 3.2 oz (85.8 kg)   SpO2 96%   BMI 34.05 kg/m  General:  Well developed, well nourished, no acute distress  Psych:  Alert and orientedx3,normal mood and affect HEENT:  Normocephalic,  atraumatic, non-icteric sclera,  supple neck without adenopathy, mass or thyromegaly Cardiovascular:  Normal S1, S2, RRR without gallop, rub or murmur Respiratory:  Good breath sounds bilaterally, CTAB with normal respiratory effort Gastrointestinal: normal bowel sounds, soft, non-tender, no noted masses. No HSM MSK: no deformities, contusions. Joints are without erythema or swelling.  Skin:  Warm, multiple moles/freckles Neurologic:    Mental status is normal. Gross motor and sensory exams are normal. Normal gait. No tremor   Commons side effects, risks, benefits, and alternatives for medications and treatment plan prescribed today were discussed, and the patient expressed understanding of the given instructions. Patient is instructed to call or message via MyChart if he/she has any questions or concerns regarding our treatment plan. No barriers to understanding were identified. We discussed Red Flag symptoms and signs in detail. Patient expressed understanding regarding what to do in case of urgent or emergency type symptoms.  Medication list was reconciled, printed and provided to the patient in AVS. Patient instructions and summary information was  reviewed with the patient as documented in the AVS. This note was prepared with assistance of Dragon voice recognition software. Occasional wrong-word or sound-a-like substitutions may have occurred due to the inherent limitations of voice recognition software

## 2022-07-29 ENCOUNTER — Other Ambulatory Visit (HOSPITAL_COMMUNITY): Payer: Self-pay

## 2022-08-02 ENCOUNTER — Other Ambulatory Visit: Payer: Self-pay | Admitting: Family Medicine

## 2022-08-02 ENCOUNTER — Encounter: Payer: Self-pay | Admitting: Family Medicine

## 2022-08-02 ENCOUNTER — Other Ambulatory Visit: Payer: Self-pay

## 2022-08-02 ENCOUNTER — Other Ambulatory Visit (HOSPITAL_COMMUNITY): Payer: Self-pay

## 2022-08-02 DIAGNOSIS — E782 Mixed hyperlipidemia: Secondary | ICD-10-CM

## 2022-08-02 MED ORDER — SERTRALINE HCL 100 MG PO TABS
100.0000 mg | ORAL_TABLET | Freq: Every day | ORAL | 3 refills | Status: DC
  Filled 2022-08-02: qty 90, 90d supply, fill #0
  Filled 2022-10-27: qty 90, 90d supply, fill #1
  Filled 2023-01-31: qty 90, 90d supply, fill #2
  Filled 2023-05-01: qty 90, 90d supply, fill #3

## 2022-08-03 ENCOUNTER — Other Ambulatory Visit: Payer: Self-pay

## 2022-08-03 ENCOUNTER — Other Ambulatory Visit (HOSPITAL_COMMUNITY): Payer: Self-pay

## 2022-08-03 MED ORDER — ROSUVASTATIN CALCIUM 10 MG PO TABS
10.0000 mg | ORAL_TABLET | Freq: Every day | ORAL | 3 refills | Status: DC
Start: 2022-08-03 — End: 2023-07-28
  Filled 2022-08-03: qty 90, 90d supply, fill #0
  Filled 2022-10-27: qty 90, 90d supply, fill #1
  Filled 2023-01-31: qty 90, 90d supply, fill #2
  Filled 2023-05-01: qty 90, 90d supply, fill #3

## 2022-08-09 ENCOUNTER — Other Ambulatory Visit (HOSPITAL_COMMUNITY): Payer: Self-pay

## 2022-08-25 ENCOUNTER — Other Ambulatory Visit (HOSPITAL_COMMUNITY): Payer: Self-pay

## 2022-08-30 ENCOUNTER — Ambulatory Visit: Payer: Commercial Managed Care - PPO | Admitting: Dermatology

## 2022-08-30 VITALS — BP 119/73 | HR 75

## 2022-08-30 DIAGNOSIS — L409 Psoriasis, unspecified: Secondary | ICD-10-CM | POA: Diagnosis not present

## 2022-08-30 DIAGNOSIS — Z7189 Other specified counseling: Secondary | ICD-10-CM | POA: Diagnosis not present

## 2022-08-30 MED ORDER — VTAMA 1 % EX CREA: TOPICAL_CREAM | CUTANEOUS | 2 refills | Status: DC

## 2022-08-30 MED ORDER — OTEZLA 10 & 20 & 30 MG PO TBPK: ORAL_TABLET | ORAL | 0 refills | Status: DC

## 2022-08-30 MED ORDER — OTEZLA 30 MG PO TABS: 30.0000 mg | ORAL_TABLET | Freq: Two times a day (BID) | ORAL | 5 refills | Status: DC

## 2022-08-30 MED ORDER — OTEZLA 30 MG PO TABS: 30.0000 mg | ORAL_TABLET | Freq: Two times a day (BID) | ORAL | 12 refills | Status: DC

## 2022-08-30 NOTE — Patient Instructions (Addendum)
Otezla - Take by mouth as directed once to twice daily. Prescription sent to [facility].   Side effects of Otezla (apremilast) include diarrhea, nausea, headache, upper respiratory infection, depression, and weight decrease (5-10%). It should only be taken by pregnant women after a discussion regarding risks and benefits with their doctor. Goal is control of skin condition, not cure.  The use of [place] requires long term medication management, including periodic office visits.  Vtama Cream - Apply to psoriasis every night.   Due to recent changes in healthcare laws, you may see results of your pathology and/or laboratory studies on MyChart before the doctors have had a chance to review them. We understand that in some cases there may be results that are confusing or concerning to you. Please understand that not all results are received at the same time and often the doctors may need to interpret multiple results in order to provide you with the best plan of care or course of treatment. Therefore, we ask that you please give [place] 2 business days to thoroughly review all your results before contacting the office for clarification. Should we see a critical lab result, you will be contacted sooner.   If You Need Anything After Your Visit  If you have any questions or concerns for your doctor, please call our main line at [phone] and press option 4 to reach your doctor's medical assistant. If no one answers, please leave a voicemail as directed and we will return your call as soon as possible. Messages left after 4 pm will be answered the following business day.   You may also send [place] a message via MyChart. We typically respond to MyChart messages within 1-2 business days.  For prescription refills, please ask your pharmacy to contact our office. Our fax number is [phone].  If you have an urgent issue when the clinic is closed that cannot wait until the next business day, you can page your  doctor at the number below.    Please note that while we do our best to be available for urgent issues outside of office hours, we are not available 24/7.   If you have an urgent issue and are unable to reach [place], you may choose to seek medical care at your doctor's office, retail clinic, urgent care center, or emergency room.  If you have a medical emergency, please immediately call 911 or go to the emergency department.  Pager Numbers  - Dr. Gwen Pounds: [phone]  - Dr. Neale Burly: [phone]  - Dr. Roseanne Reno: [phone]  In the event of inclement weather, please call our main line at [phone] for an update on the status of any delays or closures.  Dermatology Medication Tips: Please keep the boxes that topical medications come in in order to help keep track of the instructions about where and how to use these. Pharmacies typically print the medication instructions only on the boxes and not directly on the medication tubes.   If your medication is too expensive, please contact our office at [phone] option 4 or send [place] a message through MyChart.   We are unable to tell what your co-pay for medications will be in advance as this is different depending on your insurance coverage. However, we may be able to find a substitute medication at lower cost or fill out paperwork to get insurance to cover a needed medication.   If a prior authorization is required to get your medication covered by your insurance company, please allow [place] 1-2  business days to complete this process.  Drug prices often vary depending on where the prescription is filled and some pharmacies may offer cheaper prices.  The website www.goodrx.com contains coupons for medications through different pharmacies. The prices here do not account for what the cost may be with help from insurance (it may be cheaper with your insurance), but the website can give you the price if you did not use any insurance.  - You can print  the associated coupon and take it with your prescription to the pharmacy.  - You may also stop by our office during regular business hours and pick up a GoodRx coupon card.  - If you need your prescription sent electronically to a different pharmacy, notify our office through [facility] or by phone at [phone] option 4.     Si Usted Necesita Algo Despus de Su Visita  Tambin puede enviarnos un mensaje a travs de Pharmacist, community. Por lo general respondemos a los mensajes de MyChart en el transcurso de 1 a 2 das hbiles.  Para renovar recetas, por favor pida a su farmacia que se ponga en contacto con nuestra oficina. Harland Dingwall de fax es [place] [phone].  Si tiene un asunto urgente cuando la clnica est cerrada y que no puede esperar hasta el siguiente da hbil, puede llamar/localizar a su doctor(a) al nmero que aparece a continuacin.   Por favor, tenga en cuenta que aunque hacemos todo lo posible para estar disponibles para asuntos urgentes fuera del horario de [place], no estamos disponibles las 24 horas del da, los 7 das de la [place].   Si tiene un problema urgente y no puede comunicarse con nosotros, puede optar por buscar atencin mdica  en el consultorio de su doctor(a), en una clnica privada, en un centro de atencin urgente o en una sala de emergencias.  Si tiene Engineering geologist, por favor llame inmediatamente al 911 o vaya a la sala de emergencias.  Nmeros de bper  - Dr. Nehemiah Massed: [phone]  - Dra. Moye: [phone]  - Dra. Nicole Kindred: [phone]  En caso de inclemencias del [place], por favor llame a Johnsie Kindred principal al [phone] para una actualizacin sobre el [place] de cualquier retraso o cierre.  Consejos para la medicacin en dermatologa: Por favor, guarde las cajas en las que vienen los medicamentos de uso tpico para ayudarle a seguir las instrucciones sobre dnde y cmo usarlos. Las farmacias generalmente imprimen las  instrucciones del medicamento slo en las cajas y no directamente en los tubos del [place].   Si su medicamento es muy caro, por favor, pngase en contacto con Zigmund Daniel llamando al [phone] y presione la opcin 4 o envenos un mensaje a travs de Pharmacist, community.   No podemos decirle cul ser su copago por los medicamentos por adelantado ya que esto es diferente dependiendo de la cobertura de su seguro. Sin embargo, es posible que podamos encontrar un medicamento sustituto a Electrical engineer un formulario para que el seguro cubra el medicamento que se considera necesario.   Si se requiere una autorizacin previa para que su compaa de seguros [place] su medicamento, por favor permtanos de 1 a 2 das hbiles para completar este proceso.  Los precios de los medicamentos varan con frecuencia dependiendo del Environmental consultant de dnde se surte la receta y alguna farmacias pueden ofrecer precios ms baratos.  El sitio web www.goodrx.com tiene cupones para medicamentos de Airline pilot. Los precios aqu no tienen en cuenta lo que podra costar con  la ayuda del seguro (puede ser ms barato con su seguro), pero el sitio web puede darle el precio si no Field seismologist.  - Puede imprimir el cupn correspondiente y llevarlo con su receta a la farmacia.  - Tambin puede pasar por nuestra oficina durante el horario de atencin regular y Charity fundraiser una tarjeta de cupones de GoodRx.  - Si necesita que su receta se enve electrnicamente a una farmacia diferente, informe a nuestra oficina a travs de MyChart de [place] o por telfono llamando al [phone] y presione la opcin 4.

## 2022-08-30 NOTE — Progress Notes (Signed)
   New Patient Visit   Subjective  Susan Burch is a 67 y.o. female who presents for the following: Psoriasis of the posterior scalp x 3 years, itchy. She had previously seen a dermatologist in [place], and was given topical oil and IL injections.    The following portions of the chart were reviewed this encounter and updated as appropriate: medications, allergies, medical history  Review of Systems:  No other skin or systemic complaints except as noted in HPI or Assessment and Plan.  Objective  Well appearing patient in no apparent distress; mood and affect are within normal limits.  Areas Examined: Occipital scalp  Relevant exam findings are noted in the Assessment and Plan.      Assessment & Plan     PSORIASIS Erythematous scaly plaque with crusted excoriations of the occipital scalp 2% BSA.  Pt also has hair loss and wears a wig.  Chronic and persistent condition with duration or expected duration over one year. Condition is bothersome/symptomatic for patient. Currently flared.  Patient with some joint pain.  Treatment Plan: Start Otezla 30 MG take 1 po BID. Starter, Maintenance, and Bridge prescriptions sent to [facility].  Lot [id number] Exp 06/22/2024. Side effects of Otezla (apremilast) include diarrhea, nausea, headache, upper respiratory infection, depression, and weight decrease (5-10%). It should only be taken by pregnant women after a discussion regarding risks and benefits with their doctor. Goal is control of skin condition, not cure.  The use of [place] requires long term medication management, including periodic office visits.  Start Vtama Cream apply once a day to affected areas of psoriasis.  Samples given Lot [id number] Exp 03/2023.   Counseling on psoriasis and coordination of care  psoriasis is a chronic non-curable, but treatable genetic/hereditary disease that may have other systemic features affecting other organ systems such as joints (Psoriatic Arthritis). It  is associated with an increased risk of inflammatory bowel disease, heart disease, non-alcoholic fatty liver disease, and depression.  Treatments include light and laser treatments; topical medications; and systemic medications including oral and injectables.  Discussed Xtrac laser if not improving with oral and topicals. Will submit for insurance approval.    Return in 2-3 months, for Psoriasis.  ICherlyn Labella, CMA, am acting as scribe for Willeen Niece, MD .   Documentation: I have reviewed the above documentation for accuracy and completeness, and I agree with the above.  Willeen Niece, MD

## 2022-09-01 ENCOUNTER — Encounter: Payer: Self-pay | Admitting: Dermatology

## 2022-09-02 ENCOUNTER — Ambulatory Visit
Admission: RE | Admit: 2022-09-02 | Discharge: 2022-09-02 | Disposition: A | Discharge status: Home / Self Care | Payer: Commercial Managed Care - PPO | Source: Ambulatory Visit | Attending: Urgent Care | Admitting: Urgent Care

## 2022-09-02 VITALS — BP 106/63 | HR 81 | Temp 98.0°F | Resp 18

## 2022-09-02 DIAGNOSIS — R21 Rash and other nonspecific skin eruption: Secondary | ICD-10-CM

## 2022-09-02 DIAGNOSIS — T50905A Adverse effect of unspecified drugs, medicaments and biological substances, initial encounter: Secondary | ICD-10-CM

## 2022-09-02 MED ORDER — PREDNISONE 10 MG PO TABS
ORAL_TABLET | ORAL | 0 refills | Status: DC
Start: 2022-09-02 — End: 2022-09-02

## 2022-09-02 MED ORDER — PREDNISONE 10 MG PO TABS
ORAL_TABLET | ORAL | 0 refills | Status: AC
Start: 2022-09-02 — End: 2022-09-09

## 2022-09-02 MED ORDER — DEXAMETHASONE SODIUM PHOSPHATE 10 MG/ML IJ SOLN
10.0000 mg | Freq: Once | INTRAMUSCULAR | Status: AC
  Administered 2022-09-02: 10 mg via INTRAMUSCULAR

## 2022-09-02 NOTE — Discharge Instructions (Signed)
Follow-up with the prescribing doctor or your primary care provider.

## 2022-09-02 NOTE — ED Provider Notes (Signed)
[facility]    CSN: [id number] Arrival date & time: 09/02/22  1155      History   Chief Complaint Chief Complaint  Patient presents with   Allergic Reaction    I was given otezla on 5/8. Yesterday morning I woke up it ching and I have hives on my face and neck. I have been taking benadryl 50mg  every 4-6 hours since yesterday morning. The hives are continuing to spread and I need to be seen. - Entered by patient    HPI Susan Burch is a 67 y.o. female.    Allergic Reaction   Presents to urgent care with complaint of hives on her face and neck.  She is concerned with possible allergic reaction.  Patient states she is taking new medication Otelza starting 5/8.  States she awoke yesterday, which is the following day after the first dose, with hives.  Has been treating self with Benadryl every 4-6 hours and reports worsening rash.  Past Medical History:  Diagnosis Date   Centrilobular emphysema (HCC) 12/11/2019   Mild noted on CT for lung cancer screen 2021; former long term smoker.   GERD without esophagitis    HLD (hyperlipidemia)    Hypothyroidism    Mixed hyperlipidemia 02/20/2019   Sleep initiation dysfunction 09/27/2011    Patient Active Problem List   Diagnosis Date Noted   GAD (generalized anxiety disorder) 07/27/2022   Centrilobular emphysema (HCC) 12/11/2019   Obesity (BMI 30-39.9) 06/12/2019   Mixed hyperlipidemia 02/20/2019   Former smoker, 32 pack year history, quit 2006 11/12/2018   Situational mixed anxiety and depressive disorder 11/12/2018   Episodic tension-type headache, not intractable 11/12/2018   Polycythemia, mild, evaluation by Dr Candise Che 2020 09/05/2018   Bilateral lower extremity edema 04/11/2018   Mild persistent asthma 09/21/2016   Postmenopausal disorder 02/14/2013   Sleep initiation dysfunction 09/27/2011   Hypothyroidism (acquired) 11/06/2006   Allergic rhinitis 11/06/2006   GERD 11/06/2006    Past Surgical History:  Procedure  Laterality Date   BREAST EXCISIONAL BIOPSY Right 1990   CESAREAN SECTION  1994, 1996   CHOLECYSTECTOMY  1998   MYOMECTOMY  1993    OB History   No obstetric history on file.      Home Medications    Prior to Admission medications   Medication Sig Start Date End Date Taking? Authorizing Provider  Apremilast (OTEZLA) 10 & 20 & 30 MG TBPK Take as directed per package instructions. Patient not taking: Reported on 09/02/2022 08/30/22   Willeen Niece, MD  Apremilast (OTEZLA) 30 MG TABS Take 1 tablet (30 mg total) by mouth 2 (two) times daily. Patient not taking: Reported on 09/02/2022 08/30/22   Willeen Niece, MD  Apremilast (OTEZLA) 30 MG TABS Take 1 tablet (30 mg total) by mouth 2 (two) times daily. Patient not taking: Reported on 09/02/2022 08/30/22   Willeen Niece, MD  esomeprazole (NEXIUM) 20 MG capsule TAKE 1 CAPSULE BY MOUTH DAILY 07/22/21 07/27/22  Willow Ora, MD  levothyroxine (SYNTHROID) 100 MCG tablet Take 1 tablet (100 mcg total) by mouth daily. 03/30/22   Carlus Pavlov, MD  Multiple Vitamin (MULTIVITAMIN) tablet Take 1 tablet by mouth daily.    [provider]  rosuvastatin (CRESTOR) 10 MG tablet Take 1 tablet (10 mg total) by mouth daily. 08/03/22   Willow Ora, MD  Semaglutide-Weight Management (WEGOVY) 1.7 MG/0.75ML SOAJ Inject 1.7 mg into the skin once a week. Patient not taking: Reported on 09/02/2022 01/27/22   Mardelle Matte,  Malachi Bonds, MD  Semaglutide-Weight Management 1 MG/0.5ML SOAJ Inject 1 mg into the skin once a week. Patient not taking: Reported on 09/02/2022 01/27/22   Willow Ora, MD  sertraline (ZOLOFT) 100 MG tablet Take 1 tablet (100 mg total) by mouth at bedtime. 07/27/22 07/27/23  Willow Ora, MD  sertraline (ZOLOFT) 100 MG tablet Take 1 tablet (100 mg total) by mouth at bedtime. 08/02/22 08/02/23  Willow Ora, MD  Tapinarof (VTAMA) 1 % CREA Apply to affected areas psoriasis once daily. 08/30/22   Willeen Niece, MD  zolpidem (AMBIEN) 5 MG tablet Take 1 - 2  tablets (5 - 10 mg total) by mouth at bedtime as needed. 04/13/22   Willow Ora, MD    Family History Family History  Problem Relation Age of Onset   Heart Problems Mother    Hypothyroidism Mother    High blood pressure Father    High Cholesterol Father    Diabetes Brother    Diabetes Maternal Grandmother    High blood pressure Maternal Uncle    Atrial fibrillation Maternal Uncle    Hypothyroidism Maternal Uncle    Heart Problems Maternal Aunt    Cancer Maternal Aunt    Hypothyroidism Maternal Aunt    Cancer Cousin    Breast cancer Cousin    Hypothyroidism Daughter    Hypothyroidism Son     Social History Social History   Tobacco Use   Smoking status: Former    Packs/day: 1.00    Years: 32.00    Additional pack years: 0.00    Total pack years: 32.00    Types: Cigarettes    Quit date: 2006    Years since quitting: 18.3   Smokeless tobacco: Never  Vaping Use   Vaping Use: Never used  Substance Use Topics   Alcohol use: Yes    Alcohol/week: 14.0 standard drinks of alcohol    Types: 14 Glasses of wine per week    Comment: 2 glasses per day   Drug use: Never     Allergies   Bactrim, Otezla [apremilast], and Penicillins   Review of Systems Review of Systems   Physical Exam Triage Vital Signs ED Triage Vitals [09/02/22 1213]  Enc Vitals Group     BP      Pulse      Resp      Temp      Temp src      SpO2      Weight      Height      Head Circumference      Peak Flow      Pain Score 0     Pain Loc      Pain Edu?      Excl. in GC?    No data found.  Updated Vital Signs There were no vitals taken for this visit.  Visual Acuity Right Eye Distance:   Left Eye Distance:   Bilateral Distance:    Right Eye Near:   Left Eye Near:    Bilateral Near:     Physical Exam Vitals reviewed.  Constitutional:      Appearance: Normal appearance.  HENT:     Head:   Skin:    Findings: Erythema and rash present.  Neurological:     General: No  focal deficit present.     Mental Status: She is alert and oriented to person, place, and time.  Psychiatric:        Mood and Affect:  Mood normal.        Behavior: Behavior normal.      UC Treatments / Results  Labs (all labs ordered are listed, but only abnormal results are displayed) Labs Reviewed - No data to display  EKG   Radiology No results found.  Procedures Procedures (including critical care time)  Medications Ordered in UC Medications - No data to display  Initial Impression / Assessment and Plan / UC Course  I have reviewed the triage vital signs and the nursing notes.  Pertinent labs & imaging results that were available during my care of the patient were reviewed by me and considered in my medical decision making (see chart for details).   Facial rash, patches of erythema and edema.  No difficulty speaking or swallowing or breathing.  Will administer Decadron 10 mg IM in clinic and discharge with a prednisone taper.  She states she has tolerated prednisone previously.  Counseled patient on potential for adverse effects with medications prescribed/recommended today, ER and return-to-clinic precautions discussed, patient verbalized understanding and agreement with care plan.     Final Clinical Impressions(s) / UC Diagnoses   Final diagnoses:  None   Discharge Instructions   None    ED Prescriptions   None    PDMP not reviewed this encounter.   Charma Igo, [place] 09/02/22 1229

## 2022-09-02 NOTE — ED Triage Notes (Signed)
Patient to Urgent Care with complaints of possible allergic reaction/ facial swelling.  Patient reports that she was took first dose [place] on 5/8. Reports waking up yesterday with hives on her face and neck.  Has been treating w/ benadryl every 4-6 hours. Reports rash is worsening.

## 2022-09-05 ENCOUNTER — Telehealth: Payer: Self-pay

## 2022-09-05 NOTE — Telephone Encounter (Signed)
Returned patients call about a rash, she is doing better she went to the urgent care over the week and got a steroid injection and started prednisone tablets

## 2022-09-05 NOTE — Telephone Encounter (Signed)
Patient sent appt request through MyChart: "I have had an allergic reaction to otezla. I have broken out in hives on my face. They are spreading and getting worse. I have been taking benadryl 50mg  every 4-6 hours since yesterday morning. I thought it would get better, but it's not. I need to be seen, please."  Left patient message to return my call. aw

## 2022-09-08 ENCOUNTER — Telehealth: Payer: Self-pay

## 2022-09-08 ENCOUNTER — Other Ambulatory Visit (HOSPITAL_COMMUNITY): Payer: Self-pay

## 2022-09-08 ENCOUNTER — Other Ambulatory Visit: Payer: Self-pay

## 2022-09-08 NOTE — Telephone Encounter (Signed)
Called patient with Xtrac laser benefits.  Patient has $900 deductible, only $348 has been met.  Once deductible is met insurance will cover 50% of laser and patient portion is 50%.  She is eligible for the rebate coupon.  Left message for patient to return my call. aw

## 2022-09-08 NOTE — Telephone Encounter (Signed)
Spoke with patient today regarding Xtrac benefits. She is going to hold at this time due to cost.   Patient states she is still itching on her face after having allergic reaction to [facility] (see ED notes from 05/10). She has had a cortisone shot and a round of prednisone with little improvement.   Would you like to see patient?

## 2022-09-12 NOTE — Telephone Encounter (Signed)
There was a cancellation for tomorrow at 9:45am. Patient scheduled there.

## 2022-09-13 ENCOUNTER — Encounter: Payer: Self-pay | Admitting: Dermatology

## 2022-09-13 ENCOUNTER — Ambulatory Visit: Payer: Commercial Managed Care - PPO | Admitting: Dermatology

## 2022-09-13 VITALS — BP 122/85 | HR 87

## 2022-09-13 DIAGNOSIS — R21 Rash and other nonspecific skin eruption: Secondary | ICD-10-CM

## 2022-09-13 MED ORDER — DOXYCYCLINE MONOHYDRATE 100 MG PO CAPS
100.0000 mg | ORAL_CAPSULE | Freq: Every day | ORAL | 0 refills | Status: DC
Start: 2022-09-13 — End: 2022-11-30

## 2022-09-13 MED ORDER — MOMETASONE FUROATE 0.1 % EX CREA
TOPICAL_CREAM | CUTANEOUS | 0 refills | Status: DC
Start: 2022-09-13 — End: 2023-01-02

## 2022-09-13 NOTE — Patient Instructions (Addendum)
For itchy rash at face  Recommend starting over the counter allegra 180 mg tab or zyrtec 1 - 2 tabs by mouth daily   Start mometasone cream - apply topically to affected areas of itchy rash twice daily for 2 weeks. Avoid eye area.  If not itchy discontinue  Topical steroids (such as triamcinolone, fluocinolone, fluocinonide, mometasone, clobetasol, halobetasol, betamethasone, hydrocortisone) can cause thinning and lightening of the skin if they are used for too long in the same area. Your physician has selected the right strength medicine for your problem and area affected on the body. Please use your medication only as directed by your physician to prevent side effects.   Start doxycycline 100 mg tab - take 1 by mouth daily with food and drink.  Doxycycline should be taken with food to prevent nausea. Do not lay down for 30 minutes after taking. Be cautious with sun exposure and use good sun protection while on this medication. Pregnant women should not take this medication.         Due to recent changes in healthcare laws, you may see results of your pathology and/or laboratory studies on MyChart before the doctors have had a chance to review them. We understand that in some cases there may be results that are confusing or concerning to you. Please understand that not all results are received at the same time and often the doctors may need to interpret multiple results in order to provide you with the best plan of care or course of treatment. Therefore, we ask that you please give [place] 2 business days to thoroughly review all your results before contacting the office for clarification. Should we see a critical lab result, you will be contacted sooner.   If You Need Anything After Your Visit  If you have any questions or concerns for your doctor, please call our main line at [phone] and press option 4 to reach your doctor's medical assistant. If no one answers, please leave a voicemail as  directed and we will return your call as soon as possible. Messages left after 4 pm will be answered the following business day.   You may also send [place] a message via MyChart. We typically respond to MyChart messages within 1-2 business days.  For prescription refills, please ask your pharmacy to contact our office. Our fax number is [phone].  If you have an urgent issue when the clinic is closed that cannot wait until the next business day, you can page your doctor at the number below.    Please note that while we do our best to be available for urgent issues outside of office hours, we are not available 24/7.   If you have an urgent issue and are unable to reach [place], you may choose to seek medical care at your doctor's office, retail clinic, urgent care center, or emergency room.  If you have a medical emergency, please immediately call 911 or go to the emergency department.  Pager Numbers  - Dr. Gwen Pounds: [phone]  - Dr. Neale Burly: [phone]  - Dr. Roseanne Reno: [phone]  In the event of inclement weather, please call our main line at [phone] for an update on the status of any delays or closures.  Dermatology Medication Tips: Please keep the boxes that topical medications come in in order to help keep track of the instructions about where and how to use these. Pharmacies typically print the medication instructions only on the boxes and not directly on the medication tubes.  If your medication is too expensive, please contact our office at [phone] option 4 or send [place] a message through MyChart.   We are unable to tell what your co-pay for medications will be in advance as this is different depending on your insurance coverage. However, we may be able to find a substitute medication at lower cost or fill out paperwork to get insurance to cover a needed medication.   If a prior authorization is required to get your medication covered by your insurance company, please  allow [place] 1-2 business days to complete this process.  Drug prices often vary depending on where the prescription is filled and some pharmacies may offer cheaper prices.  The website www.goodrx.com contains coupons for medications through different pharmacies. The prices here do not account for what the cost may be with help from insurance (it may be cheaper with your insurance), but the website can give you the price if you did not use any insurance.  - You can print the associated coupon and take it with your prescription to the pharmacy.  - You may also stop by our office during regular business hours and pick up a GoodRx coupon card.  - If you need your prescription sent electronically to a different pharmacy, notify our office through [facility] or by phone at [phone] option 4.     Si Usted Necesita Algo Despus de Su Visita  Tambin puede enviarnos un mensaje a travs de Clinical cytogeneticist. Por lo general respondemos a los mensajes de MyChart en el transcurso de 1 a 2 das hbiles.  Para renovar recetas, por favor pida a su farmacia que se ponga en contacto con nuestra oficina. Annie Sable de fax es [place] [phone].  Si tiene un asunto urgente cuando la clnica est cerrada y que no puede esperar hasta el siguiente da hbil, puede llamar/localizar a su doctor(a) al nmero que aparece a continuacin.   Por favor, tenga en cuenta que aunque hacemos todo lo posible para estar disponibles para asuntos urgentes fuera del horario de [place], no estamos disponibles las 24 horas del da, los 7 [address]  [address] de la [place].   Si tiene un problema urgente y no puede comunicarse con nosotros, puede optar por buscar atencin mdica  en el consultorio de su doctor(a), en una clnica privada, en un centro de atencin urgente o en una sala de emergencias.  Si tiene Engineer, drilling, por favor llame inmediatamente al 911 o vaya a la sala de emergencias.  Nmeros de bper  - Dr. Gwen Pounds:  [phone]  - Dra. Moye: [phone]  - Dra. Roseanne Reno: [phone]  En caso de inclemencias del [place], por favor llame a Lacy Duverney principal al [phone] para una actualizacin sobre el [place] de cualquier retraso o cierre.  Consejos para la medicacin en dermatologa: Por favor, guarde las cajas en las que vienen los medicamentos de uso tpico para ayudarle a seguir las instrucciones sobre dnde y cmo usarlos. Las farmacias generalmente imprimen las instrucciones del medicamento slo en las cajas y no directamente en los tubos del [place].   Si su medicamento es muy caro, por favor, pngase en contacto con Rolm Gala llamando al [phone] y presione la opcin 4 o envenos un mensaje a travs de Clinical cytogeneticist.   No podemos decirle cul ser su copago por los medicamentos por adelantado ya que esto es diferente dependiendo de la cobertura de su seguro. Sin embargo, es posible que podamos encontrar un medicamento sustituto a Therapist, occupational  formulario para que el seguro cubra el medicamento que se considera necesario.   Si se requiere una autorizacin previa para que su compaa de seguros [place] su medicamento, por favor permtanos de 1 a 2 das hbiles para completar [address]  Los precios de los medicamentos varan con frecuencia dependiendo del Environmental consultant de dnde se surte la receta y alguna farmacias pueden ofrecer precios ms baratos.  El sitio web www.goodrx.com tiene cupones para medicamentos de Health and safety inspector. Los precios aqu no tienen en cuenta lo que podra costar con la ayuda del seguro (puede ser ms barato con su seguro), pero el sitio web puede darle el precio si no utiliz Tourist information centre manager.  - Puede imprimir el cupn correspondiente y llevarlo con su receta a la farmacia.  - Tambin puede pasar por nuestra oficina durante el horario de atencin regular y Education officer, museum una tarjeta de cupones de GoodRx.  - Si necesita que su receta se enve electrnicamente a  una farmacia diferente, informe a nuestra oficina a travs de MyChart de [place] o por telfono llamando al [phone] y presione la opcin 4.

## 2022-09-13 NOTE — Progress Notes (Signed)
   Follow-Up Visit   Subjective  Susan Burch is a 67 y.o. female who presents for the following: Rash follow up at face, after taking 1 dose of otezla. Pt has scalp psoriasis. Seen by ED on 09/02/22 for rash at face after taking 1 dose of otezla. Given rx of prednisone 10 mg taper and shot of dexamethasone sodium phosphate 10 mg  to help with swelling and itching. Today says she finished prednisone taper and she is still some swelling with itch at face.  The following portions of the chart were reviewed this encounter and updated as appropriate: medications, allergies, medical history  Review of Systems:  No other skin or systemic complaints except as noted in HPI or Assessment and Plan.  Objective  Well appearing patient in no apparent distress; mood and affect are within normal limits.   A focused examination was performed of the following areas: face   Relevant exam findings are noted in the Assessment and Plan.  face Erythema on cheeks with mild edema     Assessment & Plan    Rash and other nonspecific skin eruption face  contact dermatitis vs drug reaction vrs rosacea flare  Possible reaction to otezla after 1 dose.  Reports not using any new products  Denies hx of rosacea  Denies spending time in sun or outdoors  Start doxycycline 100 mg cap - take 1 po qd with food   Doxycycline should be taken with food to prevent nausea. Do not lay down for 30 minutes after taking. Be cautious with sun exposure and use good sun protection while on this medication. Pregnant women should not take this medication.   Start otc Allegra 180 or Zyrtec 10 mg tab take 1 to 2 times daily for hives   Start mometasone 0.1 % cream - apply topically to aa avoiding the eye area bid prn for itchy rash for 2 weeks. D/c sooner if clears  Topical steroids (such as triamcinolone, fluocinolone, fluocinonide, mometasone, clobetasol, halobetasol, betamethasone, hydrocortisone) can cause thinning and  lightening of the skin if they are used for too long in the same area. Your physician has selected the right strength medicine for your problem and area affected on the body. Please use your medication only as directed by your physician to prevent side effects.    mometasone (ELOCON) 0.1 % cream - face Apply topically to aa of cheeks neck twice daily prn for itch for 2 weeks. Avoid eye area  doxycycline (MONODOX) 100 MG capsule - face Take 1 capsule (100 mg total) by mouth daily.    Return for 2 - 3  week follow up on rash .  I, Asher Muir, CMA, am acting as scribe for Willeen Niece, MD.   Documentation: I have reviewed the above documentation for accuracy and completeness, and I agree with the above.  Willeen Niece, MD

## 2022-09-23 ENCOUNTER — Other Ambulatory Visit (HOSPITAL_COMMUNITY): Payer: Self-pay

## 2022-09-27 ENCOUNTER — Ambulatory Visit: Payer: Commercial Managed Care - PPO | Admitting: Dermatology

## 2022-09-27 ENCOUNTER — Other Ambulatory Visit: Payer: Self-pay

## 2022-09-27 ENCOUNTER — Other Ambulatory Visit (HOSPITAL_COMMUNITY): Payer: Self-pay

## 2022-09-27 ENCOUNTER — Other Ambulatory Visit (HOSPITAL_BASED_OUTPATIENT_CLINIC_OR_DEPARTMENT_OTHER): Payer: Self-pay

## 2022-09-27 VITALS — BP 116/75 | HR 79

## 2022-09-27 DIAGNOSIS — L719 Rosacea, unspecified: Secondary | ICD-10-CM

## 2022-09-27 DIAGNOSIS — R21 Rash and other nonspecific skin eruption: Secondary | ICD-10-CM | POA: Diagnosis not present

## 2022-09-27 DIAGNOSIS — L409 Psoriasis, unspecified: Secondary | ICD-10-CM | POA: Diagnosis not present

## 2022-09-27 MED ORDER — METRONIDAZOLE 0.75 % EX CREA
TOPICAL_CREAM | CUTANEOUS | 5 refills | Status: DC
  Filled 2022-09-27: qty 45, 30d supply, fill #0

## 2022-09-27 NOTE — Patient Instructions (Signed)
Due to recent changes in healthcare laws, you may see results of your pathology and/or laboratory studies on MyChart before the doctors have had a chance to review them. We understand that in some cases there may be results that are confusing or concerning to you. Please understand that not all results are received at the same time and often the doctors may need to interpret multiple results in order to provide you with the best plan of care or course of treatment. Therefore, we ask that you please give us 2 business days to thoroughly review all your results before contacting the office for clarification. Should we see a critical lab result, you will be contacted sooner.   If You Need Anything After Your Visit  If you have any questions or concerns for your doctor, please call our main line at [phone] and press option 4 to reach your doctor's medical assistant. If no one answers, please leave a voicemail as directed and we will return your call as soon as possible. Messages left after 4 pm will be answered the following business day.   You may also send us a message via MyChart. We typically respond to MyChart messages within 1-2 business days.  For prescription refills, please ask your pharmacy to contact our office. Our fax number is [phone].  If you have an urgent issue when the clinic is closed that cannot wait until the next business day, you can page your doctor at the number below.    Please note that while we do our best to be available for urgent issues outside of office hours, we are not available 24/7.   If you have an urgent issue and are unable to reach us, you may choose to seek medical care at your doctor's office, retail clinic, urgent care center, or emergency room.  If you have a medical emergency, please immediately call 911 or go to the emergency department.  Pager Numbers  - Dr. Kowalski: [phone]  - Dr. Moye: [phone]  - Dr. Stewart:  [phone]  In the event of inclement weather, please call our main line at [phone] for an update on the status of any delays or closures.  Dermatology Medication Tips: Please keep the boxes that topical medications come in in order to help keep track of the instructions about where and how to use these. Pharmacies typically print the medication instructions only on the boxes and not directly on the medication tubes.   If your medication is too expensive, please contact our office at [phone] option 4 or send us a message through MyChart.   We are unable to tell what your co-pay for medications will be in advance as this is different depending on your insurance coverage. However, we may be able to find a substitute medication at lower cost or fill out paperwork to get insurance to cover a needed medication.   If a prior authorization is required to get your medication covered by your insurance company, please allow us 1-2 business days to complete this process.  Drug prices often vary depending on where the prescription is filled and some pharmacies may offer cheaper prices.  The website www.goodrx.com contains coupons for medications through different pharmacies. The prices here do not account for what the cost may be with help from insurance (it may be cheaper with your insurance), but the website can give you the price if you did not use any insurance.  - You can print the associated coupon and take it with   your prescription to the pharmacy.  - You may also stop by our office during regular business hours and pick up a GoodRx coupon card.  - If you need your prescription sent electronically to a different pharmacy, notify our office through [facility] MyChart or by phone at [phone] option 4.     Si Usted Necesita Algo Despus de Su Visita  Tambin puede enviarnos un mensaje a travs de MyChart. Por lo general respondemos a los mensajes de MyChart en el transcurso de 1 a 2  das hbiles.  Para renovar recetas, por favor pida a su farmacia que se ponga en contacto con nuestra oficina. Nuestro nmero de fax es el [phone].  Si tiene un asunto urgente cuando la clnica est cerrada y que no puede esperar hasta el siguiente da hbil, puede llamar/localizar a su doctor(a) al nmero que aparece a continuacin.   Por favor, tenga en cuenta que aunque hacemos todo lo posible para estar disponibles para asuntos urgentes fuera del horario de oficina, no estamos disponibles las 24 horas del da, los 7 das de la semana.   Si tiene un problema urgente y no puede comunicarse con nosotros, puede optar por buscar atencin mdica  en el consultorio de su doctor(a), en una clnica privada, en un centro de atencin urgente o en una sala de emergencias.  Si tiene una emergencia mdica, por favor llame inmediatamente al 911 o vaya a la sala de emergencias.  Nmeros de bper  - Dr. Kowalski: [phone]  - Dra. Moye: [phone]  - Dra. Stewart: [phone]  En caso de inclemencias del tiempo, por favor llame a nuestra lnea principal al [phone] para una actualizacin sobre el estado de cualquier retraso o cierre.  Consejos para la medicacin en dermatologa: Por favor, guarde las cajas en las que vienen los medicamentos de uso tpico para ayudarle a seguir las instrucciones sobre dnde y cmo usarlos. Las farmacias generalmente imprimen las instrucciones del medicamento slo en las cajas y no directamente en los tubos del medicamento.   Si su medicamento es muy caro, por favor, pngase en contacto con nuestra oficina llamando al [phone] y presione la opcin 4 o envenos un mensaje a travs de MyChart.   No podemos decirle cul ser su copago por los medicamentos por adelantado ya que esto es diferente dependiendo de la cobertura de su seguro. Sin embargo, es posible que podamos encontrar un medicamento sustituto a menor costo o llenar un formulario para que el  seguro cubra el medicamento que se considera necesario.   Si se requiere una autorizacin previa para que su compaa de seguros cubra su medicamento, por favor permtanos de 1 a 2 das hbiles para completar este proceso.  Los precios de los medicamentos varan con frecuencia dependiendo del lugar de dnde se surte la receta y alguna farmacias pueden ofrecer precios ms baratos.  El sitio web www.goodrx.com tiene cupones para medicamentos de diferentes farmacias. Los precios aqu no tienen en cuenta lo que podra costar con la ayuda del seguro (puede ser ms barato con su seguro), pero el sitio web puede darle el precio si no utiliz ningn seguro.  - Puede imprimir el cupn correspondiente y llevarlo con su receta a la farmacia.  - Tambin puede pasar por nuestra oficina durante el horario de atencin regular y recoger una tarjeta de cupones de GoodRx.  - Si necesita que su receta se enve electrnicamente a una farmacia diferente, informe a nuestra oficina a travs de MyChart de [place]   o por telfono llamando al [phone] y presione la opcin 4.  

## 2022-09-27 NOTE — Progress Notes (Signed)
   Follow Up Visit   Subjective  Susan Burch is a 67 y.o. female who presents for the following: 2 week follow-up rash of the face (Contact Dermatitis vs Drug Reaction vs Rosacea Flare). Itching is much improved after taking doxycycline 100 MG daily and using mometasone cream daily. She is still using both. She does still have some pinkness on the cheeks and chin.      The following portions of the chart were reviewed this encounter and updated as appropriate: medications, allergies, medical history  Review of Systems:  No other skin or systemic complaints except as noted in HPI or Assessment and Plan.  Objective  Well appearing patient in no apparent distress; mood and affect are within normal limits.  A focused examination was performed of the following areas: face  Relevant exam findings are noted in the Assessment and Plan.    Assessment & Plan   DRUG REACTION VS ROSACEA FLARE  Exam: Mild erythema with telangiectasias of the malar cheeks and chin.  Chronic and persistent condition with duration or expected duration over one year. Condition is symptomatic/ bothersome to patient. Not currently at goal, but improving.   Rosacea is a chronic progressive skin condition usually affecting the face of adults, causing redness and/or acne bumps. It is treatable but not curable. It sometimes affects the eyes (ocular rosacea) as well. It may respond to topical and/or systemic medication and can flare with stress, sun exposure, alcohol, exercise, topical steroids (including hydrocortisone/cortisone 10) and some foods.  Daily application of broad spectrum spf 30+ sunscreen to face is recommended to reduce flares.   Treatment Plan:  d/c mometasone cream  Start metronidazole 0.75% cream Apply to qd/bid to AA face dsp 45g 5Rf.  Finish doxycycline 100 MG 1 po QD with food. Pt has 2 weeks left.  If patient flares, discussed low-dose doxycycline.  Doxycycline should be taken with food to  prevent nausea. Do not lay down for 30 minutes after taking. Be cautious with sun exposure and use good sun protection while on this medication. Pregnant women should not take this medication.    PSORIASIS Exam: Pt not examined today. Pt wears wig.  Chronic and persistent condition with duration or expected duration over one year. Condition is symptomatic/ bothersome to patient. Not currently at goal, but improving per patient.  Pt stopped Otezla after having rash on face.  Using topicals only.  Psoriasis is a chronic non-curable, but treatable genetic/hereditary disease that may have other systemic features affecting other organ systems such as joints (Psoriatic Arthritis). It is associated with an increased risk of inflammatory bowel disease, heart disease, non-alcoholic fatty liver disease, and depression.  Treatments include light and laser treatments; topical medications; and systemic medications including oral and injectables.  Treatment Plan: Continue Vtama cream to AA scalp.   Return as scheduled for psoriasis.  Susan Burch, CMA, am acting as scribe for Willeen Niece, MD .   Documentation: I have reviewed the above documentation for accuracy and completeness, and I agree with the above.  Willeen Niece, MD

## 2022-09-28 ENCOUNTER — Other Ambulatory Visit: Payer: Self-pay

## 2022-09-28 ENCOUNTER — Other Ambulatory Visit: Payer: Self-pay | Admitting: Family Medicine

## 2022-09-28 MED ORDER — ESOMEPRAZOLE MAGNESIUM 20 MG PO CPDR
20.0000 mg | DELAYED_RELEASE_CAPSULE | Freq: Every day | ORAL | 3 refills | Status: AC
  Filled 2022-09-28: qty 90, 90d supply, fill #0

## 2022-10-05 ENCOUNTER — Other Ambulatory Visit (INDEPENDENT_AMBULATORY_CARE_PROVIDER_SITE_OTHER): Payer: Commercial Managed Care - PPO

## 2022-10-05 DIAGNOSIS — E039 Hypothyroidism, unspecified: Secondary | ICD-10-CM | POA: Diagnosis not present

## 2022-10-05 LAB — T4, FREE: Free T4: 0.75 ng/dL (ref 0.60–1.60)

## 2022-10-05 LAB — TSH: TSH: 1.25 u[IU]/mL (ref 0.35–5.50)

## 2022-10-11 ENCOUNTER — Other Ambulatory Visit (HOSPITAL_COMMUNITY): Payer: Self-pay

## 2022-10-11 ENCOUNTER — Other Ambulatory Visit: Payer: Self-pay | Admitting: Family Medicine

## 2022-10-11 DIAGNOSIS — G4700 Insomnia, unspecified: Secondary | ICD-10-CM

## 2022-10-11 MED ORDER — ZOLPIDEM TARTRATE 5 MG PO TABS
5.0000 mg | ORAL_TABLET | Freq: Every evening | ORAL | 5 refills | Status: DC | PRN
Start: 2022-10-11 — End: 2023-04-06
  Filled 2022-10-11: qty 60, 30d supply, fill #0
  Filled 2022-11-09: qty 60, 30d supply, fill #1
  Filled 2022-12-09: qty 60, 30d supply, fill #2
  Filled 2023-01-06 (×2): qty 60, 30d supply, fill #3
  Filled 2023-02-07: qty 60, 30d supply, fill #4
  Filled 2023-03-08: qty 60, 30d supply, fill #5

## 2022-10-21 ENCOUNTER — Other Ambulatory Visit (HOSPITAL_COMMUNITY): Payer: Self-pay

## 2022-11-02 ENCOUNTER — Other Ambulatory Visit (HOSPITAL_COMMUNITY): Payer: Self-pay

## 2022-11-03 ENCOUNTER — Other Ambulatory Visit: Payer: Self-pay

## 2022-11-03 ENCOUNTER — Other Ambulatory Visit (HOSPITAL_COMMUNITY): Payer: Self-pay

## 2022-11-04 ENCOUNTER — Other Ambulatory Visit (HOSPITAL_COMMUNITY): Payer: Self-pay

## 2022-11-09 ENCOUNTER — Other Ambulatory Visit (HOSPITAL_COMMUNITY): Payer: Self-pay

## 2022-11-17 ENCOUNTER — Ambulatory Visit: Payer: Commercial Managed Care - PPO | Admitting: Podiatry

## 2022-11-23 ENCOUNTER — Ambulatory Visit: Payer: Commercial Managed Care - PPO | Admitting: Dermatology

## 2022-11-24 ENCOUNTER — Other Ambulatory Visit (HOSPITAL_COMMUNITY): Payer: Self-pay

## 2022-11-25 ENCOUNTER — Other Ambulatory Visit (HOSPITAL_COMMUNITY): Payer: Self-pay

## 2022-11-30 ENCOUNTER — Ambulatory Visit: Payer: Commercial Managed Care - PPO | Admitting: Dermatology

## 2022-11-30 VITALS — BP 132/84 | HR 72

## 2022-11-30 DIAGNOSIS — L578 Other skin changes due to chronic exposure to nonionizing radiation: Secondary | ICD-10-CM

## 2022-11-30 DIAGNOSIS — L988 Other specified disorders of the skin and subcutaneous tissue: Secondary | ICD-10-CM

## 2022-11-30 DIAGNOSIS — W908XXA Exposure to other nonionizing radiation, initial encounter: Secondary | ICD-10-CM

## 2022-11-30 DIAGNOSIS — R238 Other skin changes: Secondary | ICD-10-CM

## 2022-11-30 DIAGNOSIS — D1801 Hemangioma of skin and subcutaneous tissue: Secondary | ICD-10-CM | POA: Diagnosis not present

## 2022-11-30 DIAGNOSIS — Z1283 Encounter for screening for malignant neoplasm of skin: Secondary | ICD-10-CM | POA: Diagnosis not present

## 2022-11-30 DIAGNOSIS — L821 Other seborrheic keratosis: Secondary | ICD-10-CM | POA: Diagnosis not present

## 2022-11-30 DIAGNOSIS — L905 Scar conditions and fibrosis of skin: Secondary | ICD-10-CM | POA: Diagnosis not present

## 2022-11-30 DIAGNOSIS — L814 Other melanin hyperpigmentation: Secondary | ICD-10-CM | POA: Diagnosis not present

## 2022-11-30 DIAGNOSIS — L409 Psoriasis, unspecified: Secondary | ICD-10-CM

## 2022-11-30 DIAGNOSIS — D692 Other nonthrombocytopenic purpura: Secondary | ICD-10-CM | POA: Diagnosis not present

## 2022-11-30 DIAGNOSIS — L719 Rosacea, unspecified: Secondary | ICD-10-CM

## 2022-11-30 DIAGNOSIS — D229 Melanocytic nevi, unspecified: Secondary | ICD-10-CM

## 2022-11-30 NOTE — Progress Notes (Signed)
Follow-Up Visit   Subjective  Susan Burch is a 67 y.o. female who presents for the following: Skin Cancer Screening and Full Body Skin Exam  The patient presents for Total-Body Skin Exam (TBSE) for skin cancer screening and mole check. The patient has spots, moles and lesions to be evaluated, some may be new or changing. No history of skin cancer. Patient does have past history of tanning bed use. Psoriasis of the occipital scalp improving with mometasone cream and Vtama cream (2x/wk). Drug reaction vs rosacea of the face improved after using     The following portions of the chart were reviewed this encounter and updated as appropriate: medications, allergies, medical history  Review of Systems:  No other skin or systemic complaints except as noted in HPI or Assessment and Plan.  Objective  Well appearing patient in no apparent distress; mood and affect are within normal limits.  A full examination was performed including scalp, head, eyes, ears, nose, lips, neck, chest, axillae, abdomen, back, buttocks, bilateral upper extremities, bilateral lower extremities, hands, feet, fingers, toes, fingernails, and toenails. All findings within normal limits unless otherwise noted below.   Relevant physical exam findings are noted in the Assessment and Plan.  Right Lower Lip Purple papule    Assessment & Plan   SKIN CANCER SCREENING PERFORMED TODAY.  ACTINIC DAMAGE - Chronic condition, secondary to cumulative UV/sun exposure - diffuse scaly erythematous macules with underlying dyspigmentation - Recommend daily broad spectrum sunscreen SPF 30+ to sun-exposed areas, reapply every 2 hours as needed.  - Staying in the shade or wearing long sleeves, sun glasses (UVA+UVB protection) and wide brim hats (4-inch brim around the entire circumference of the hat) are also recommended for sun protection.  - Call for new or changing lesions.  LENTIGINES, SEBORRHEIC KERATOSES, HEMANGIOMAS - Benign  normal skin lesions - Benign-appearing - Call for any changes  MELANOCYTIC NEVI - Tan-brown and/or pink-flesh-colored symmetric macules and papules - Benign appearing on exam today - Observation - Call clinic for new or changing moles - Recommend daily use of broad spectrum spf 30+ sunscreen to sun-exposed areas.   Drug Reaction vs Rosacea, improved Exam Mid face erythema. Itching has resolved  Chronic condition with duration or expected duration over one year. Currently well-controlled.   Rosacea is a chronic progressive skin condition usually affecting the face of adults, causing redness and/or acne bumps. It is treatable but not curable. It sometimes affects the eyes (ocular rosacea) as well. It may respond to topical and/or systemic medication and can flare with stress, sun exposure, alcohol, exercise, topical steroids (including hydrocortisone/cortisone 10) and some foods.  Daily application of broad spectrum spf 30+ sunscreen to face is recommended to reduce flares.  Patient denies grittiness of the eyes  Treatment Plan If flares, restart metronidazole 0.75% cream 1-2 times daily.   PSORIASIS Exam: Pink scaly patch occipital scalp 2% BSA. Pt wears wig.  Chronic and persistent condition with duration or expected duration over one year. Condition is symptomatic/ bothersome to patient. Not currently at goal.  Patient with some joint pain  Psoriasis is a chronic non-curable, but treatable genetic/hereditary disease that may have other systemic features affecting other organ systems such as joints (Psoriatic Arthritis). It is associated with an increased risk of inflammatory bowel disease, heart disease, non-alcoholic fatty liver disease, and depression.  Treatments include light and laser treatments; topical medications; and systemic medications including oral and injectables.  Treatment Plan: Continue mometasone cream 1-2 times a day as  needed for itch. Increase Vtama Cream to  once a day to affected areas of scalp psoriasis.   Topical steroids (such as triamcinolone, fluocinolone, fluocinonide, mometasone, clobetasol, halobetasol, betamethasone, hydrocortisone) can cause thinning and lightening of the skin if they are used for too long in the same area. Your physician has selected the right strength medicine for your problem and area affected on the body. Please use your medication only as directed by your physician to prevent side effects.    SEBORRHEIC KERATOSIS - Waxy flesh papule of the right upper breast - Benign-appearing - Discussed benign etiology and prognosis. - Observe - Call for any changes  SCAR (from cyst removal) Exam:depressed round white scar  Benign-appearing.  Observation.  Call clinic for new or changing lesions. Recommend daily broad spectrum sunscreen SPF 30+, reapply every 2 hours as needed.  Purpura - Chronic; persistent and recurrent.  Treatable, but not curable. - Violaceous macules and patches; R thumbnail with purpuric patch from recent trauma, photo today. - Benign - Related to trauma, age, sun damage and/or use of blood thinners, chronic use of topical and/or oral steroids - Observe - Can use OTC arnica containing moisturizer such as Dermend Bruise Formula if desired - Call for worsening or other concerns   Venous lake Right Lower Lip  Benign, observe.     Return in about 1 year (around 11/30/2023) for TBSE.  ICherlyn Labella, CMA, am acting as scribe for Willeen Niece, MD .   Documentation: I have reviewed the above documentation for accuracy and completeness, and I agree with the above.  Willeen Niece, MD

## 2022-11-30 NOTE — Patient Instructions (Signed)

## 2022-12-01 ENCOUNTER — Other Ambulatory Visit (HOSPITAL_COMMUNITY): Payer: Self-pay

## 2022-12-09 ENCOUNTER — Other Ambulatory Visit (HOSPITAL_COMMUNITY): Payer: Self-pay

## 2022-12-19 ENCOUNTER — Encounter: Payer: Self-pay | Admitting: Family Medicine

## 2022-12-19 ENCOUNTER — Telehealth: Payer: Self-pay | Admitting: Family Medicine

## 2022-12-19 ENCOUNTER — Ambulatory Visit: Payer: Commercial Managed Care - PPO | Admitting: Family Medicine

## 2022-12-19 VITALS — BP 119/65 | HR 75 | Ht 62.5 in | Wt 204.0 lb

## 2022-12-19 DIAGNOSIS — L239 Allergic contact dermatitis, unspecified cause: Secondary | ICD-10-CM | POA: Diagnosis not present

## 2022-12-19 MED ORDER — PREDNISONE 10 MG PO TABS
ORAL_TABLET | ORAL | 0 refills | Status: DC
Start: 2022-12-19 — End: 2023-04-29

## 2022-12-19 MED ORDER — METHYLPREDNISOLONE SODIUM SUCC 125 MG IJ SOLR
125.0000 mg | Freq: Once | INTRAMUSCULAR | Status: AC
Start: 2022-12-19 — End: 2022-12-19
  Administered 2022-12-19: 125 mg via INTRAMUSCULAR

## 2022-12-19 NOTE — Telephone Encounter (Signed)
FYI: This call has been transferred to triage nurse: Access Nurse. Once the result note has been entered staff can address the message at that time.  Patient called in with the following symptoms:  Red Word: Allergic reaction; Facial swelling, redness and itchiness   Please advise at Mobile [phone] (mobile)  Message is routed to Provider Pool.

## 2022-12-19 NOTE — Patient Instructions (Signed)
Solu-medrol today for faster relief Prednisone taper to start tomorrow Continue Zyrtec Referral for allergy testing

## 2022-12-19 NOTE — Telephone Encounter (Signed)
Noted  

## 2022-12-19 NOTE — Progress Notes (Signed)
Acute Office Visit  Subjective:     Patient ID: Susan Burch, female    DOB: 12-15-1955, 67 y.o.   MRN: [id number]  Chief Complaint  Patient presents with   Facial Swelling   Rash     Patient is in today for rash.  Discussed the use of AI scribe software for clinical note transcription with the patient, who gave verbal consent to proceed.  History of Present Illness   The patient presented with a sudden onset of pruritus and erythema that began on Saturday evening while at work. The itching was primarily located on the face and neck, which turned notably red. By the following morning, the patient noticed facial swelling, particularly around the eyes. Despite taking over-the-counter antihistamines such as Benadryl, Zyrtec, and Allegra, the patient reported minimal relief.  This is the second occurrence of such symptoms for the patient. The first episode occurred after taking a single dose of Otezla for psoriasis, after which the patient discontinued the medication. During that episode, the patient sought care at an urgent care where they received a steroid injection and a prescription for prednisone. The patient reported no new exposures to soaps, detergents, foods, or environmental factors that could have triggered current reaction.  The rash was described as raised, itchy, and hot, confined to the face, neck, and chest, with no involvement of the arms, legs, abdomen, or back. The patient denied any difficulty swallowing or breathing, and no itching of the eyes. The only potential allergen identified was the consumption of [employer] peanut butter cups and small chocolate bars towards the end of their work shift, although the patient has consumed these items in the past without issue.             All review of systems negative except what is listed in the HPI      Objective:    BP 119/65   Pulse 75   Ht 5' 2.5" (1.588 m)   Wt 204 lb (92.5 kg)   SpO2 96%   BMI 36.72 kg/m     Physical Exam Vitals reviewed.  Constitutional:      Appearance: Normal appearance.  Cardiovascular:     Rate and Rhythm: Normal rate and regular rhythm.     Heart sounds: Normal heart sounds.  Pulmonary:     Effort: Pulmonary effort is normal.     Breath sounds: Normal breath sounds.  Skin:    General: Skin is warm and dry.     Findings: Rash present.     Comments: Erythematous maculopapular rash to upper chest, neck, face  Neurological:     Mental Status: She is alert and oriented to person, place, and time.  Psychiatric:        Mood and Affect: Mood normal.        Behavior: Behavior normal.        Thought Content: Thought content normal.        Judgment: Judgment normal.     No results found for any visits on 12/19/22.      Assessment & Plan:   Problem List Items Addressed This Visit   None Visit Diagnoses     Allergic dermatitis    -  Primary   Relevant Medications   predniSONE (DELTASONE) 10 MG tablet   methylPREDNISolone sodium succinate (SOLU-MEDROL) 125 mg/2 mL injection 125 mg (Completed)   Other Relevant Orders   Ambulatory referral to Allergy     Solu-medrol today for faster relief  Prednisone taper  to start tomorrow Continue Zyrtec Referral for allergy testing   Meds ordered this encounter  Medications   predniSONE (DELTASONE) 10 MG tablet    Sig: TAKE 3 TABLETS PO QD FOR 3 DAYS THEN TAKE 2 TABLETS PO QD FOR 3 DAYS THEN TAKE 1 TABLET PO QD FOR 3 DAYS THEN TAKE 1/2 TAB PO QD FOR 3 DAYS    Dispense:  20 tablet    Refill:  0    Order Specific Question:   Supervising Provider    Answer:   Danise Edge A [[id number]]   methylPREDNISolone sodium succinate (SOLU-MEDROL) 125 mg/2 mL injection 125 mg    Return if symptoms worsen or fail to improve.  Clayborne Dana, NP

## 2022-12-19 NOTE — Telephone Encounter (Signed)
Susan Burch, triage nurse, called stating final outcome was for patient to be seen with 3-4 hours. Patient has been scheduled for 3pm with Hyman Hopes. Awaiting triage note.

## 2022-12-20 NOTE — Telephone Encounter (Signed)
Patient Name First: Susan Last: Burch Gender: Female DOB: [birth year] Age: 67 Y 4 M 10 D Return Phone Number: [phone] (Primary) Address[place] [address] Client [facility] - [facility], [facility] Provider Asencion Partridge- MD Contact Type Call Who Is Calling Patient / Member / Family / Caregiver Call Type Triage / Clinical Relationship To Patient Self Return Phone Number [phone] (Primary) Chief Complaint Facial Swelling Reason for Call Symptomatic / Request for Health Information Initial Comment [name] from the office put caller thru Caller says that she seems to have an allergic. Her face is red. It started getting a bit itchy on Saturday, and it got swollen yesterday. Taking Benadryl. Translation No Nurse Assessment Nurse: Shanna Cisco, RN, Gavin Pound Date/Time Lamount Cohen Time): 12/19/2022 11:54:18 AM Confirm and document reason for call. If symptomatic, describe symptoms. ---Caller states she seems to be having an allergic reaction. Her face is red and started getting itchy on Saturday. Pt states her face also got swollen yesterday and she's taking Benadryl. Does the patient have any new or worsening symptoms? ---Yes Will a triage be completed? ---Yes Related visit to physician within the last 2 weeks? ---No Does the PT have any chronic conditions? (i.e. diabetes, asthma, this includes High risk factors for pregnancy, etc.) ---Yes List chronic conditions. ---Hypothyroidism HLD Sleep problems Anxiety Is this a behavioral health or substance abuse call? ---No Guidelines Guideline Title Affirmed Question Affirmed Notes Nurse Date/Time Lamount Cohen Time) Face Swelling SEVERE swelling of the entire face Shanna Cisco, RN, Gavin Pound 12/19/2022 11:55:47 AM Disp. Time Lamount Cohen Time) Disposition Final User 12/19/2022 12:00:08 PM See HCP within 4 Hours (or PCP triage) Yes  Shanna Cisco, RN, Deborah Final Disposition 12/19/2022 12:00:08 PM See HCP within 4 Hours (or PCP triage) Yes Shanna Cisco, RN, Jetty Duhamel Disagree/Comply Comply Caller Understands Yes PreDisposition Call Doctor Care Advice Given Per Guideline SEE HCP (OR PCP TRIAGE) WITHIN 4 HOURS: * IF OFFICE WILL BE OPEN: You need to be seen within the next 3 or 4 hours. Call your doctor (or NP/PA) now or as soon as the office opens. ANTIHISTAMINE MEDICINES FOR ITCHING OR SWELLING: * You can take cetirizine, fexofenadine, or loratadine to reduce itching and swelling. * CETIRIZINE (REACTINE, ZYRTEC): The adult dose is 10 mg. You take it once a day. Cetirizine is available in the [place] as Zyrtec and in [place] as Reactine. * You become worse CALL BACK IF: CARE ADVICE given per Face Swelling (Adult) guideline. Comments User: Karilyn Cota, RN Date/Time Lamount Cohen Time): 12/19/2022 12:02:33 PM Call was warm transferred to the backline for an appt within 3-4 hrs. Referrals REFERRED TO PCP OFFICE

## 2022-12-20 NOTE — Telephone Encounter (Signed)
Noted  

## 2022-12-21 ENCOUNTER — Ambulatory Visit: Payer: Commercial Managed Care - PPO | Admitting: Family Medicine

## 2022-12-27 ENCOUNTER — Other Ambulatory Visit: Payer: Self-pay | Admitting: Internal Medicine

## 2022-12-27 ENCOUNTER — Other Ambulatory Visit (HOSPITAL_COMMUNITY): Payer: Self-pay

## 2022-12-27 MED ORDER — LEVOTHYROXINE SODIUM 100 MCG PO TABS
100.0000 ug | ORAL_TABLET | Freq: Every day | ORAL | 5 refills | Status: DC
  Filled 2022-12-27: qty 45, 45d supply, fill #0
  Filled 2023-02-09: qty 45, 45d supply, fill #1
  Filled 2023-03-22 (×2): qty 45, 45d supply, fill #2
  Filled 2023-05-01: qty 45, 45d supply, fill #3

## 2022-12-30 ENCOUNTER — Other Ambulatory Visit (HOSPITAL_BASED_OUTPATIENT_CLINIC_OR_DEPARTMENT_OTHER): Payer: Self-pay

## 2022-12-30 ENCOUNTER — Other Ambulatory Visit: Payer: Self-pay | Admitting: Dermatology

## 2022-12-30 DIAGNOSIS — R21 Rash and other nonspecific skin eruption: Secondary | ICD-10-CM

## 2023-01-06 ENCOUNTER — Other Ambulatory Visit (HOSPITAL_COMMUNITY): Payer: Self-pay

## 2023-01-11 NOTE — Progress Notes (Unsigned)
[name] [address] [place] [address] Phone: [phone] Subjective:   Susan Burch, am serving as a scribe for Dr. Antoine Primas.  I'm seeing this patient by the request  of:  Willow Ora, MD  CC: Bilateral hip pain  [id number]  01/06/2021 Chronic problem with continuing more of an exacerbation. Given injection today and tolerated the procedure well. Discussed icing regimen and home exercises, increase activity slowly. Follow-up again with me in 6 to 8 weeks. Worsening pain will consider the possibility of work-up for the lumbar spine but I think it is unlikely. Also can consider formal physical therapy.   Updated 01/12/2023 Susan Burch is a 67 y.o. female coming in with complaint of R knee pain. Had pain a few weeks ago on medial aspect. Pain has subsided.   Also c/o pain in B hips over posterior and lateral aspect. Was having shooting pain in L leg. This has subsided. Pain in lower back intermittently. Works as a Engineer, civil (consulting) and stands all day   Right hip x-rays previously taken in 2022 showed mild arthritic changes.  No imaging of the knee has been done in the computer system.    Past Medical History:  Diagnosis Date   Centrilobular emphysema (HCC) 12/11/2019   Mild noted on CT for lung cancer screen 2021; former long term smoker.   GERD without esophagitis    HLD (hyperlipidemia)    Hypothyroidism    Mixed hyperlipidemia 02/20/2019   Sleep initiation dysfunction 09/27/2011   Past Surgical History:  Procedure Laterality Date   BREAST EXCISIONAL BIOPSY Right 1990   CESAREAN SECTION  1994, 1996   CHOLECYSTECTOMY  1998   MYOMECTOMY  1993   Social History   Socioeconomic History   Marital status: Married    Spouse name: Not on file   Number of children: Not on file   Years of education: Not on file   Highest education level: Not on file  Occupational History   Occupation: Charity fundraiser    Employer: [employer]  Tobacco Use    Smoking status: Former    Current packs/day: 0.00    Average packs/day: 1 pack/day for 32.0 years (32.0 ttl pk-yrs)    Types: Cigarettes    Start date: 10    Quit date: 2006    Years since quitting: 18.7   Smokeless tobacco: Never  Vaping Use   Vaping status: Never Used  Substance and Sexual Activity   Alcohol use: Yes    Alcohol/week: 14.0 standard drinks of alcohol    Types: 14 Glasses of wine per week    Comment: 2 glasses per day   Drug use: Never   Sexual activity: Yes  Other Topics Concern   Not on file  Social History Narrative   Not on file   Social Determinants of Health   Financial Resource Strain: Not on file  Food Insecurity: Not on file  Transportation Needs: Not on file  Physical Activity: Not on file  Stress: Not on file  Social Connections: Not on file   Allergies  Allergen Reactions   Bactrim    Otezla [Apremilast] Hives   Penicillins    Family History  Problem Relation Age of Onset   Heart Problems Mother    Hypothyroidism Mother    High blood pressure Father    High Cholesterol Father    Diabetes Brother    Diabetes Maternal Grandmother    High blood pressure Maternal Uncle  Atrial fibrillation Maternal Uncle    Hypothyroidism Maternal Uncle    Heart Problems Maternal Aunt    Cancer Maternal Aunt    Hypothyroidism Maternal Aunt    Cancer Cousin    Breast cancer Cousin    Hypothyroidism Daughter    Hypothyroidism Son     Current Outpatient Medications (Endocrine & Metabolic):    levothyroxine (SYNTHROID) 100 MCG tablet, Take 1 tablet (100 mcg total) by mouth daily.   predniSONE (DELTASONE) 10 MG tablet, TAKE 3 TABLETS PO QD FOR 3 DAYS THEN TAKE 2 TABLETS PO QD FOR 3 DAYS THEN TAKE 1 TABLET PO QD FOR 3 DAYS THEN TAKE 1/2 TAB PO QD FOR 3 DAYS  Current Outpatient Medications (Cardiovascular):    rosuvastatin (CRESTOR) 10 MG tablet, Take 1 tablet (10 mg total) by mouth daily.     Current Outpatient Medications (Other):     esomeprazole (NEXIUM) 20 MG capsule, Take 1 capsule (20 mg total) by mouth daily.   mometasone (ELOCON) 0.1 % cream, qd to bid 5 days a week to aa psoriasis prn flares   Multiple Vitamin (MULTIVITAMIN) tablet, Take 1 tablet by mouth daily.   sertraline (ZOLOFT) 100 MG tablet, Take 1 tablet (100 mg total) by mouth at bedtime.   Tapinarof (VTAMA) 1 % CREA, Apply to affected areas psoriasis once daily.   zolpidem (AMBIEN) 5 MG tablet, Take 1 - 2 tablets (5 - 10 mg total) by mouth at bedtime as needed.   Reviewed prior external information including notes and imaging from  primary care provider As well as notes that were available from care everywhere and other healthcare systems.  Past medical history, social, surgical and family history all reviewed in electronic medical record.  No pertanent information unless stated regarding to the chief complaint.   Review of Systems:  No headache, visual changes, nausea, vomiting, diarrhea, constipation, dizziness, abdominal pain, skin rash, fevers, chills, night sweats, weight loss, swollen lymph nodes, body aches, joint swelling, chest pain, shortness of breath, mood changes. POSITIVE muscle aches  Objective  There were no vitals taken for this visit.   General: No apparent distress alert and oriented x3 mood and affect normal, dressed appropriately.  HEENT: Pupils equal, extraocular movements intact  Respiratory: Patient's speak in full sentences and does not appear short of breath  Cardiovascular: No lower extremity edema, non tender, no erythema  Bilateral hip exam show the patient does have tenderness to palpation over the greater trochanteric area right greater than left.  Both of them no seem to be severe.  Positive Pearlean Brownie.  Negative straight leg test.  Minimal discomfort in the back itself.   Procedure: Real-time Ultrasound Guided Injection of right greater trochanteric bursitis secondary to patient's body habitus Device: GE Logiq  Q7 Ultrasound guided injection is preferred based studies that show increased duration, increased effect, greater accuracy, decreased procedural pain, increased response rate, and decreased cost with ultrasound guided versus blind injection.  Verbal informed consent obtained.  Time-out conducted.  Noted no overlying erythema, induration, or other signs of local infection.  Skin prepped in a sterile fashion.  Local anesthesia: Topical Ethyl chloride.  With sterile technique and under real time ultrasound guidance:  Greater trochanteric area was visualized and patient's bursa was noted. A 22-gauge 3 inch needle was inserted and 4 cc of 0.5% Marcaine and 1 cc of Kenalog 40 mg/dL was injected. Pictures taken Completed without difficulty  Pain immediately resolved suggesting accurate placement of the medication.  Advised to call if  fevers/chills, erythema, induration, drainage, or persistent bleeding.  Impression: Technically successful ultrasound guided injection.   Procedure: Real-time Ultrasound Guided Injection of left  greater trochanteric bursitis secondary to patient's body habitus Device: GE Logiq Q7  Ultrasound guided injection is preferred based studies that show increased duration, increased effect, greater accuracy, decreased procedural pain, increased response rate, and decreased cost with ultrasound guided versus blind injection.  Verbal informed consent obtained.  Time-out conducted.  Noted no overlying erythema, induration, or other signs of local infection.  Skin prepped in a sterile fashion.  Local anesthesia: Topical Ethyl chloride.  With sterile technique and under real time ultrasound guidance:  Greater trochanteric area was visualized and patient's bursa was noted. A 22-gauge 3 inch needle was inserted and 4 cc of 0.5% Marcaine and 1 cc of Kenalog 40 mg/dL was injected. Pictures taken Completed without difficulty  Pain immediately resolved suggesting accurate placement of the  medication.  Advised to call if fevers/chills, erythema, induration, drainage, or persistent bleeding.  Impression: Technically successful ultrasound guided injection.     Impression and Recommendations:     The above documentation has been reviewed and is accurate and complete Judi Saa, DO

## 2023-01-12 ENCOUNTER — Encounter: Payer: Self-pay | Admitting: Family Medicine

## 2023-01-12 ENCOUNTER — Ambulatory Visit (INDEPENDENT_AMBULATORY_CARE_PROVIDER_SITE_OTHER): Payer: Commercial Managed Care - PPO | Admitting: Family Medicine

## 2023-01-12 VITALS — BP 124/78 | HR 82 | Ht 62.5 in | Wt 209.0 lb

## 2023-01-12 DIAGNOSIS — M7061 Trochanteric bursitis, right hip: Secondary | ICD-10-CM | POA: Diagnosis not present

## 2023-01-12 DIAGNOSIS — M7062 Trochanteric bursitis, left hip: Secondary | ICD-10-CM | POA: Diagnosis not present

## 2023-01-12 DIAGNOSIS — M25551 Pain in right hip: Secondary | ICD-10-CM

## 2023-01-12 MED ORDER — METHYLPREDNISOLONE ACETATE 40 MG/ML IJ SUSP
40.0000 mg | Freq: Once | INTRAMUSCULAR | Status: AC
Start: 2023-01-12 — End: 2023-01-12
  Administered 2023-01-12: 40 mg via INTRA_ARTICULAR

## 2023-01-12 NOTE — Assessment & Plan Note (Signed)
Chronic problem with exacerbation noted.  Has had this intermittently for quite some time and last injection was greater than 2 years ago.  Differential includes lumbar radiculopathy and could consider other medications.  Discussed home exercises and icing regimen.  Discussed which activities to do and which ones to avoid.  Follow-up with me again 2 to 3 months.  Worsening pain advanced imaging could be warranted.

## 2023-01-12 NOTE — Patient Instructions (Signed)
Do prescribed exercises at least 3x a week  See you again in 2 months

## 2023-01-31 ENCOUNTER — Other Ambulatory Visit (HOSPITAL_COMMUNITY): Payer: Self-pay

## 2023-02-07 ENCOUNTER — Other Ambulatory Visit (HOSPITAL_COMMUNITY): Payer: Self-pay

## 2023-02-07 ENCOUNTER — Other Ambulatory Visit: Payer: Self-pay

## 2023-02-08 ENCOUNTER — Other Ambulatory Visit (HOSPITAL_COMMUNITY): Payer: Self-pay

## 2023-02-09 ENCOUNTER — Other Ambulatory Visit (HOSPITAL_COMMUNITY): Payer: Self-pay

## 2023-02-14 ENCOUNTER — Ambulatory Visit: Payer: Self-pay | Admitting: Allergy

## 2023-03-08 ENCOUNTER — Other Ambulatory Visit (HOSPITAL_COMMUNITY): Payer: Self-pay

## 2023-03-09 ENCOUNTER — Other Ambulatory Visit (HOSPITAL_COMMUNITY): Payer: Self-pay

## 2023-03-16 ENCOUNTER — Ambulatory Visit: Payer: Commercial Managed Care - PPO | Admitting: Family Medicine

## 2023-03-22 ENCOUNTER — Other Ambulatory Visit (HOSPITAL_COMMUNITY): Payer: Self-pay

## 2023-03-29 ENCOUNTER — Ambulatory Visit: Payer: Self-pay | Admitting: Internal Medicine

## 2023-04-03 ENCOUNTER — Encounter: Payer: Self-pay | Admitting: Family Medicine

## 2023-04-03 NOTE — Telephone Encounter (Signed)
Patient called in regards to the message below. I offered Dr Denyse Amass or Dr Jean Rosenthal but she wanted to see Dr Katrinka Blazing. I have her on the wait list.

## 2023-04-04 NOTE — Telephone Encounter (Signed)
Patient is scheduled to see Dr Jean Rosenthal tomorrow.

## 2023-04-04 NOTE — Progress Notes (Unsigned)
    Aleen Sells D.[facility] [address] [place] [address] Phone: [phone]   Assessment and Plan:     There are no diagnoses linked to this encounter.  ***   Pertinent previous records reviewed include ***    Follow Up: ***     Subjective:   I, Koran Seabrook, am serving as a Neurosurgeon for Doctor Richardean Sale  Chief Complaint: right foot pain   HPI:   04/05/2023 Patient is a 67 year old female with right foot pain. Patient states  Relevant Historical Information: ***  Additional pertinent review of systems negative.   Current Outpatient Medications:    esomeprazole (NEXIUM) 20 MG capsule, Take 1 capsule (20 mg total) by mouth daily., Disp: 90 capsule, Rfl: 3   levothyroxine (SYNTHROID) 100 MCG tablet, Take 1 tablet (100 mcg total) by mouth daily., Disp: 45 tablet, Rfl: 5   mometasone (ELOCON) 0.1 % cream, qd to bid 5 days a week to aa psoriasis prn flares, Disp: 45 g, Rfl: 0   Multiple Vitamin (MULTIVITAMIN) tablet, Take 1 tablet by mouth daily., Disp: , Rfl:    predniSONE (DELTASONE) 10 MG tablet, TAKE 3 TABLETS PO QD FOR 3 DAYS THEN TAKE 2 TABLETS PO QD FOR 3 DAYS THEN TAKE 1 TABLET PO QD FOR 3 DAYS THEN TAKE 1/2 TAB PO QD FOR 3 DAYS, Disp: 20 tablet, Rfl: 0   rosuvastatin (CRESTOR) 10 MG tablet, Take 1 tablet (10 mg total) by mouth daily., Disp: 90 tablet, Rfl: 3   sertraline (ZOLOFT) 100 MG tablet, Take 1 tablet (100 mg total) by mouth at bedtime., Disp: 90 tablet, Rfl: 3   Tapinarof (VTAMA) 1 % CREA, Apply to affected areas psoriasis once daily., Disp: 60 g, Rfl: 2   zolpidem (AMBIEN) 5 MG tablet, Take 1 - 2 tablets (5 - 10 mg total) by mouth at bedtime as needed., Disp: 60 tablet, Rfl: 5   Objective:     There were no vitals filed for this visit.    There is no height or weight on file to calculate BMI.    Physical Exam:    ***   Electronically signed by:  Aleen Sells [facility] 11:34 AM 04/04/23

## 2023-04-05 ENCOUNTER — Other Ambulatory Visit (HOSPITAL_COMMUNITY): Payer: Self-pay

## 2023-04-05 ENCOUNTER — Ambulatory Visit (INDEPENDENT_AMBULATORY_CARE_PROVIDER_SITE_OTHER): Payer: Commercial Managed Care - PPO

## 2023-04-05 ENCOUNTER — Ambulatory Visit: Payer: Commercial Managed Care - PPO | Admitting: Sports Medicine

## 2023-04-05 VITALS — BP 132/84 | HR 81 | Ht 62.0 in | Wt 214.0 lb

## 2023-04-05 DIAGNOSIS — M25571 Pain in right ankle and joints of right foot: Secondary | ICD-10-CM

## 2023-04-05 DIAGNOSIS — M19071 Primary osteoarthritis, right ankle and foot: Secondary | ICD-10-CM | POA: Diagnosis not present

## 2023-04-05 DIAGNOSIS — M79671 Pain in right foot: Secondary | ICD-10-CM | POA: Diagnosis not present

## 2023-04-05 DIAGNOSIS — M7671 Peroneal tendinitis, right leg: Secondary | ICD-10-CM

## 2023-04-05 DIAGNOSIS — M2011 Hallux valgus (acquired), right foot: Secondary | ICD-10-CM | POA: Diagnosis not present

## 2023-04-05 MED ORDER — MELOXICAM 15 MG PO TABS
15.0000 mg | ORAL_TABLET | Freq: Every day | ORAL | 0 refills | Status: DC
  Filled 2023-04-05: qty 30, 30d supply, fill #0

## 2023-04-05 NOTE — Patient Instructions (Signed)
-   Start meloxicam 15 mg daily x2 weeks.  If still having pain after 2 weeks, complete 3rd-week of NSAID. May use remaining NSAID as needed once daily for pain control.  Do not to use additional over-the-counter NSAIDs (ibuprofen, naproxen, Advil, Aleve) while taking prescription NSAIDs.  May use Tylenol [phone] mg 2 to 3 times a day for breakthrough pain. Ankle HEP  4 week follow up

## 2023-04-06 ENCOUNTER — Other Ambulatory Visit (HOSPITAL_COMMUNITY): Payer: Self-pay

## 2023-04-06 ENCOUNTER — Other Ambulatory Visit: Payer: Self-pay | Admitting: Family Medicine

## 2023-04-06 DIAGNOSIS — G4700 Insomnia, unspecified: Secondary | ICD-10-CM

## 2023-04-06 MED ORDER — ZOLPIDEM TARTRATE 5 MG PO TABS
5.0000 mg | ORAL_TABLET | Freq: Every evening | ORAL | 5 refills | Status: DC | PRN
  Filled 2023-04-06: qty 60, 30d supply, fill #0
  Filled 2023-05-08: qty 60, 30d supply, fill #1
  Filled 2023-06-07: qty 30, 30d supply, fill #2
  Filled 2023-06-22: qty 60, 30d supply, fill #3
  Filled 2023-08-08 (×3): qty 60, 30d supply, fill #4
  Filled 2023-09-07: qty 60, 30d supply, fill #5

## 2023-04-07 ENCOUNTER — Other Ambulatory Visit (HOSPITAL_COMMUNITY): Payer: Self-pay

## 2023-04-12 ENCOUNTER — Other Ambulatory Visit: Payer: Self-pay

## 2023-04-12 ENCOUNTER — Ambulatory Visit: Payer: Commercial Managed Care - PPO | Admitting: Sports Medicine

## 2023-04-12 VITALS — BP 124/84 | HR 76 | Ht 62.0 in | Wt 214.0 lb

## 2023-04-12 DIAGNOSIS — M79671 Pain in right foot: Secondary | ICD-10-CM | POA: Diagnosis not present

## 2023-04-12 DIAGNOSIS — M7671 Peroneal tendinitis, right leg: Secondary | ICD-10-CM | POA: Diagnosis not present

## 2023-04-12 NOTE — Patient Instructions (Signed)
Continue meloxicam Use boot over the next 3-4 weeks

## 2023-04-12 NOTE — Progress Notes (Signed)
Susan Burch D.[facility] [address] [place] [address] Phone: [phone]   Assessment and Plan:     1. Right foot pain 2. Peroneal tendinitis of right lower extremity  -Acute, uncomplicated, subsequent visit - Still most consistent with peroneal tendinitis based on HPI and physical exam.  No improvement with 1 week of meloxicam 15 mg daily and HEP, so patient return to clinic requesting CSI.  Tolerated well per note below - Continue HEP for peroneal tendinitis - Continue meloxicam 15 mg daily for an additional 1 to 2 weeks - Recommend using cam boot as much as possible when ambulatory to allow for rest and recovery.  Boot provided today  Procedure: Ultrasound Guided peroneal tendon sheath Injection  Side: Right Diagnosis: Peroneal tendinitis [place] Indication:  - accuracy is paramount for diagnosis - to ensure therapeutic efficacy or procedural success - to reduce procedural risk  After explaining the procedure, viable alternatives, risks, and answering any questions, consent was given verbally. The site was cleaned with chlorhexidine prep. An ultrasound transducer was placed on the lateral ankle and peroneus longus and brevis tendons were identified.  Care was taken to avoid vascular structures.  A needle was introduced under ultrasound guidance with sterile technique.    A steroid injection was performed using 1ml of 1% lidocaine without epinephrine and 20 mg of triamcinolone (KENALOG) 40mg /ml. This was well tolerated and resulted in  relief.  Needle was removed and dressing placed and post injection instructions were given including a discussion of likely return of pain today after the anesthetic wears off (with the possibility of worsened pain) until the steroid starts to work in 1-3 days.   Pt was advised to call or return to clinic if these symptoms worsen or fail to improve as anticipated.   Pertinent previous records reviewed include  none  Follow Up: 4 weeks for reevaluation.  Could consider physical therapy versus advanced imaging if no improvement.  If improving, would wean off of and discontinue boot use   Subjective:   I, Susan Burch, am serving as a Neurosurgeon for Doctor Richardean Sale   Chief Complaint: right foot pain    HPI:    04/05/2023 Patient is a 67 year old female with right foot pain. Patient states right foot pain and swelling that started about a week ago. 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 No MOI. No meds for the pain . If she is on her feet long enough the pain will radiate up to her knee. Ice and rest seem to help a little. No numbness or tingling.   04/12/2023 Patient states that her foot is still swollen and painful. Isnt able to work 12 hr shifts    Relevant Historical Information: None pertinent    Additional pertinent review of systems negative.   Current Outpatient Medications:    esomeprazole (NEXIUM) 20 MG capsule, Take 1 capsule (20 mg total) by mouth daily., Disp: 90 capsule, Rfl: 3   levothyroxine (SYNTHROID) 100 MCG tablet, Take 1 tablet (100 mcg total) by mouth daily., Disp: 45 tablet, Rfl: 5   meloxicam (MOBIC) 15 MG tablet, Take 1 tablet (15 mg total) by mouth daily., Disp: 30 tablet, Rfl: 0   mometasone (ELOCON) 0.1 % cream, qd to bid 5 days a week to aa psoriasis prn flares, Disp: 45 g, Rfl: 0   Multiple Vitamin (MULTIVITAMIN) tablet, Take 1 tablet by mouth daily., Disp: , Rfl:  predniSONE (DELTASONE) 10 MG tablet, TAKE 3 TABLETS PO QD FOR 3 DAYS THEN TAKE 2 TABLETS PO QD FOR 3 DAYS THEN TAKE 1 TABLET PO QD FOR 3 DAYS THEN TAKE 1/2 TAB PO QD FOR 3 DAYS, Disp: 20 tablet, Rfl: 0   rosuvastatin (CRESTOR) 10 MG tablet, Take 1 tablet (10 mg total) by mouth daily., Disp: 90 tablet, Rfl: 3   sertraline (ZOLOFT) 100 MG tablet, Take 1 tablet (100 mg total) by mouth at bedtime., Disp: 90 tablet, Rfl: 3   Tapinarof (VTAMA) 1 % CREA, Apply to affected areas psoriasis  once daily., Disp: 60 g, Rfl: 2   zolpidem (AMBIEN) 5 MG tablet, Take 1 - 2 tablets (5 - 10 mg total) by mouth at bedtime as needed., Disp: 60 tablet, Rfl: 5   Objective:     Vitals:   04/12/23 0802  BP: 124/84  Pulse: 76  SpO2: 99%  Weight: 214 lb (97.1 kg)  Height: 5\' 2"  (1.575 m)      Body mass index is 39.14 kg/m.    Physical Exam:    Gen: Appears well, nad, nontoxic and pleasant Psych: Alert and oriented, appropriate mood and affect Neuro: sensation intact, strength is 5/5 with df/pf/inv/ev, muscle tone wnl Skin: no susupicious lesions or rashes   Right foot/ankle:  No deformity, no swelling or effusion TTP posterior to lateral malleolus and spacing between lateral malleolus and base of fifth metatarsal NTTP over fibular head, lat mal, medial mal, achilles, navicular, base of 5th, ATFL, CFL, deltoid, calcaneous or midfoot ROM DF 30, PF 45, inv/ev intact Negative ant drawer, talar tilt, rotation test, squeeze test. Neg thompson Lateral ankle pain with resisted plantarflexion and eversion       Electronically signed by:  Susan Burch D.[facility] 8:25 AM 04/12/23

## 2023-04-17 ENCOUNTER — Ambulatory Visit (HOSPITAL_BASED_OUTPATIENT_CLINIC_OR_DEPARTMENT_OTHER)
Admission: RE | Admit: 2023-04-17 | Discharge: 2023-04-17 | Disposition: A | Discharge status: Home / Self Care | Payer: Commercial Managed Care - PPO | Source: Ambulatory Visit | Attending: Internal Medicine | Admitting: Internal Medicine

## 2023-04-17 ENCOUNTER — Encounter (HOSPITAL_BASED_OUTPATIENT_CLINIC_OR_DEPARTMENT_OTHER): Payer: Self-pay

## 2023-04-17 VITALS — BP 131/69 | HR 62 | Temp 97.6°F | Resp 18

## 2023-04-17 DIAGNOSIS — J029 Acute pharyngitis, unspecified: Secondary | ICD-10-CM

## 2023-04-17 LAB — POC COVID19/FLU A&B COMBO
Covid Antigen, POC: NEGATIVE
Influenza A Antigen, POC: NEGATIVE
Influenza B Antigen, POC: NEGATIVE

## 2023-04-17 LAB — POCT RAPID STREP A (OFFICE): Rapid Strep A Screen: NEGATIVE

## 2023-04-17 NOTE — ED Triage Notes (Signed)
Pt c/o cough, sore throat x 2 days, headache started yesterday. Pt has gargled salt water this morning.

## 2023-04-17 NOTE — ED Provider Notes (Signed)
[facility]    CSN: [id number] Arrival date & time: 04/17/23  1093      History   Chief Complaint Chief Complaint  Patient presents with   Sore Throat    Entered by patient    HPI Susan Burch is a 67 y.o. female.    Sore Throat Associated symptoms include headaches. Pertinent negatives include no abdominal pain.  Sore throat gradual onset 2 days ago associated with headache and fatigue.  Admits hoarse voice and slight nasal congestion.  Denies fever, chills, sweats, earache, swollen glands, cough, chest pain, shortness of breath, nausea, vomiting, diarrhea, rashes or skin changes.  Denies known contacts with illness.   Past Medical History:  Diagnosis Date   Centrilobular emphysema (HCC) 12/11/2019   Mild noted on CT for lung cancer screen 2021; former long term smoker.   GERD without esophagitis    HLD (hyperlipidemia)    Hypothyroidism    Mixed hyperlipidemia 02/20/2019   Sleep initiation dysfunction 09/27/2011    Patient Active Problem List   Diagnosis Date Noted   GAD (generalized anxiety disorder) 07/27/2022   Centrilobular emphysema (HCC) 12/11/2019   Obesity (BMI 30-39.9) 06/12/2019   Mixed hyperlipidemia 02/20/2019   Former smoker, 32 pack year history, quit 2006 11/12/2018   Situational mixed anxiety and depressive disorder 11/12/2018   Episodic tension-type headache, not intractable 11/12/2018   Polycythemia, mild, evaluation by Dr Candise Che 2020 09/05/2018   Bilateral lower extremity edema 04/11/2018   Mild persistent asthma 09/21/2016   Postmenopausal disorder 02/14/2013   Sleep initiation dysfunction 09/27/2011   Greater trochanteric bursitis of both hips 11/28/2006   Hypothyroidism (acquired) 11/06/2006   Allergic rhinitis 11/06/2006   GERD 11/06/2006    Past Surgical History:  Procedure Laterality Date   BREAST EXCISIONAL BIOPSY Right 1990   CESAREAN SECTION  1994, 1996   CHOLECYSTECTOMY  1998   MYOMECTOMY  1993    OB History   No  obstetric history on file.      Home Medications    Prior to Admission medications   Medication Sig Start Date End Date Taking? Authorizing Provider  levothyroxine (SYNTHROID) 100 MCG tablet Take 1 tablet (100 mcg total) by mouth daily. 12/27/22  Yes Carlus Pavlov, MD  meloxicam (MOBIC) 15 MG tablet Take 1 tablet (15 mg total) by mouth daily. 04/05/23  Yes Richardean Sale, DO  rosuvastatin (CRESTOR) 10 MG tablet Take 1 tablet (10 mg total) by mouth daily. 08/03/22  Yes Willow Ora, MD  sertraline (ZOLOFT) 100 MG tablet Take 1 tablet (100 mg total) by mouth at bedtime. 08/02/22 08/02/23 Yes Willow Ora, MD  esomeprazole (NEXIUM) 20 MG capsule Take 1 capsule (20 mg total) by mouth daily. 09/28/22   Willow Ora, MD  mometasone (ELOCON) 0.1 % cream qd to bid 5 days a week to aa psoriasis prn flares 01/02/23   Willeen Niece, MD  Multiple Vitamin (MULTIVITAMIN) tablet Take 1 tablet by mouth daily.    [provider]  predniSONE (DELTASONE) 10 MG tablet TAKE 3 TABLETS PO QD FOR 3 DAYS THEN TAKE 2 TABLETS PO QD FOR 3 DAYS THEN TAKE 1 TABLET PO QD FOR 3 DAYS THEN TAKE 1/2 TAB PO QD FOR 3 DAYS 12/19/22   Clayborne Dana, NP  Tapinarof (VTAMA) 1 % CREA Apply to affected areas psoriasis once daily. 08/30/22   Willeen Niece, MD  zolpidem (AMBIEN) 5 MG tablet Take 1 - 2 tablets (5 - 10 mg total) by mouth at  bedtime as needed. 04/06/23   Willow Ora, MD    Family History Family History  Problem Relation Age of Onset   Heart Problems Mother    Hypothyroidism Mother    High blood pressure Father    High Cholesterol Father    Diabetes Brother    Diabetes Maternal Grandmother    High blood pressure Maternal Uncle    Atrial fibrillation Maternal Uncle    Hypothyroidism Maternal Uncle    Heart Problems Maternal Aunt    Cancer Maternal Aunt    Hypothyroidism Maternal Aunt    Cancer Cousin    Breast cancer Cousin    Hypothyroidism Daughter    Hypothyroidism Son     Social  History Social History   Tobacco Use   Smoking status: Former    Current packs/day: 0.00    Average packs/day: 1 pack/day for 32.0 years (32.0 ttl pk-yrs)    Types: Cigarettes    Start date: 18    Quit date: 2006    Years since quitting: 18.9   Smokeless tobacco: Never  Vaping Use   Vaping status: Never Used  Substance Use Topics   Alcohol use: Yes    Alcohol/week: 14.0 standard drinks of alcohol    Types: 14 Glasses of wine per week    Comment: 2 glasses per day   Drug use: Never     Allergies   Bactrim, Otezla [apremilast], and Penicillins   Review of Systems Review of Systems  Constitutional:  Positive for fatigue. Negative for chills and fever.  HENT:  Positive for congestion, sore throat and voice change. Negative for postnasal drip, rhinorrhea and trouble swallowing.   Respiratory:  Negative for cough.   Gastrointestinal:  Negative for abdominal pain, diarrhea, nausea and vomiting.  Skin:  Negative for rash.  Neurological:  Positive for headaches.     Physical Exam Triage Vital Signs ED Triage Vitals  Encounter Vitals Group     BP 04/17/23 0856 131/69     Systolic BP Percentile --      Diastolic BP Percentile --      Pulse Rate 04/17/23 0856 62     Resp 04/17/23 0856 18     Temp 04/17/23 0856 97.6 F (36.4 C)     Temp Source 04/17/23 0856 Oral     SpO2 04/17/23 0856 96 %     Weight --      Height --      Head Circumference --      Peak Flow --      Pain Score 04/17/23 0853 4     Pain Loc --      Pain Education --      Exclude from Growth Chart --    No data found.  Updated Vital Signs BP 131/69 (BP Location: Right Arm)   Pulse 62   Temp 97.6 F (36.4 C) (Oral)   Resp 18   SpO2 96%   Visual Acuity Right Eye Distance:   Left Eye Distance:   Bilateral Distance:    Right Eye Near:   Left Eye Near:    Bilateral Near:     Physical Exam Vitals and nursing note reviewed.  Constitutional:      Appearance: She is not ill-appearing.   HENT:     Head: Normocephalic and atraumatic.     Right Ear: Tympanic membrane and ear canal normal.     Left Ear: Tympanic membrane and ear canal normal.     Mouth/Throat:  Mouth: Mucous membranes are moist. No oral lesions.     Pharynx: Uvula midline. Posterior oropharyngeal erythema present. No oropharyngeal exudate or uvula swelling.     Tonsils: No tonsillar exudate or tonsillar abscesses.  Cardiovascular:     Rate and Rhythm: Normal rate and regular rhythm.  Pulmonary:     Effort: Pulmonary effort is normal.     Breath sounds: Normal breath sounds.  Musculoskeletal:     Cervical back: Neck supple.  Lymphadenopathy:     Cervical: Cervical adenopathy (Mildly enlarged anterior cervical nodes mobile nontender) present.  Neurological:     Mental Status: She is alert.      UC Treatments / Results  Labs (all labs ordered are listed, but only abnormal results are displayed) Labs Reviewed  POCT RAPID STREP A (OFFICE)    EKG   Radiology No results found.  Procedures Procedures (including critical care time)  Medications Ordered in UC Medications - No data to display  Initial Impression / Assessment and Plan / UC Course  I have reviewed the triage vital signs and the nursing notes.  Pertinent labs & imaging results that were available during my care of the patient were reviewed by me and considered in my medical decision making (see chart for details).      67 year old female with sore throat x 2 days associated with headache and fatigue.  Has mild pharyngeal erythema on exam but no exudate, has hoarse voice, mildly enlarged anterior cervical nodes.  Normal swallowing no evidence of peritonsillar abscess.  Point-of-care strep is negative.  Likely viral pharyngitis recommend OTC meds for symptomatic relief, throat lozenges, salt water gargles.  Warning signs and follow-up reviewed with patient.  Patient requested COVID and flu testing.  Point-of-care COVID and fluid  negative Final Clinical Impressions(s) / UC Diagnoses     Final diagnoses:  None   Discharge Instructions   None    ED Prescriptions   None    PDMP not reviewed this encounter.   Meliton Rattan, [place] 04/17/23 [phone]

## 2023-04-29 ENCOUNTER — Other Ambulatory Visit: Payer: Self-pay

## 2023-04-29 ENCOUNTER — Ambulatory Visit
Admission: RE | Admit: 2023-04-29 | Discharge: 2023-04-29 | Disposition: A | Discharge status: Home / Self Care | Payer: Commercial Managed Care - PPO | Source: Ambulatory Visit | Attending: Emergency Medicine | Admitting: Emergency Medicine

## 2023-04-29 VITALS — HR 79 | Temp 98.9°F | Resp 18

## 2023-04-29 DIAGNOSIS — U071 COVID-19: Secondary | ICD-10-CM | POA: Diagnosis not present

## 2023-04-29 HISTORY — DX: Psoriasis, unspecified: L40.9

## 2023-04-29 LAB — POC COVID19/FLU A&B COMBO
Covid Antigen, POC: POSITIVE — AB
Influenza A Antigen, POC: NEGATIVE
Influenza B Antigen, POC: NEGATIVE

## 2023-04-29 MED ORDER — PAXLOVID (300/100) 20 X 150 MG & 10 X 100MG PO TBPK: 3.0000 | ORAL_TABLET | Freq: Two times a day (BID) | ORAL | 0 refills | Status: AC

## 2023-04-29 MED ORDER — BENZONATATE 100 MG PO CAPS: 100.0000 mg | ORAL_CAPSULE | Freq: Three times a day (TID) | ORAL | 0 refills | Status: DC

## 2023-04-29 MED ORDER — PROMETHAZINE-DM 6.25-15 MG/5ML PO SYRP: 5.0000 mL | ORAL_SOLUTION | Freq: Every evening | ORAL | 0 refills | Status: DC | PRN

## 2023-04-29 MED ORDER — PREDNISONE 20 MG PO TABS: 40.0000 mg | ORAL_TABLET | Freq: Every day | ORAL | 0 refills | Status: DC

## 2023-04-29 NOTE — ED Provider Notes (Signed)
 Susan Burch    CSN: [id number] Arrival date & time: 04/29/23  9075      History   Chief Complaint Chief Complaint  Patient presents with   Cough    HPI Susan Burch is a 68 y.o. female.   Patient presents for evaluation of nasal congestion, rhinorrhea, sinus pressure across the forehead and across the front teeth, persistent productive cough present for 2 days.  Associated headaches primarily from coughing.  Has attempted use of Allegra and Tylenol.  Known sick contacts at work.  Tolerating food and liquids.  Denies fever, shortness of breath wheezing or abdominal symptoms.  Past Medical History:  Diagnosis Date   Centrilobular emphysema (HCC) 12/11/2019   Mild noted on CT for lung cancer screen 2021; former long term smoker.   GERD without esophagitis    HLD (hyperlipidemia)    Hypothyroidism    Mixed hyperlipidemia 02/20/2019   Psoriasis    Sleep initiation dysfunction 09/27/2011    Patient Active Problem List   Diagnosis Date Noted   GAD (generalized anxiety disorder) 07/27/2022   Centrilobular emphysema (HCC) 12/11/2019   Obesity (BMI 30-39.9) 06/12/2019   Mixed hyperlipidemia 02/20/2019   Former smoker, 32 pack year history, quit 2006 11/12/2018   Situational mixed anxiety and depressive disorder 11/12/2018   Episodic tension-type headache, not intractable 11/12/2018   Polycythemia, mild, evaluation by Dr Onesimo 2020 09/05/2018   Bilateral lower extremity edema 04/11/2018   Mild persistent asthma 09/21/2016   Postmenopausal disorder 02/14/2013   Sleep initiation dysfunction 09/27/2011   Greater trochanteric bursitis of both hips 11/28/2006   Hypothyroidism (acquired) 11/06/2006   Allergic rhinitis 11/06/2006   GERD 11/06/2006    Past Surgical History:  Procedure Laterality Date   BREAST EXCISIONAL BIOPSY Right 1990   CESAREAN SECTION  1994, 1996   CHOLECYSTECTOMY  1998   MYOMECTOMY  1993    OB History   No obstetric history on file.       Home Medications    Prior to Admission medications   Medication Sig Start Date End Date Taking? Authorizing Provider  benzonatate  (TESSALON ) 100 MG capsule Take 1 capsule (100 mg total) by mouth every 8 (eight) hours. 04/29/23  Yes Kaden Dunkel R, NP  esomeprazole  (NEXIUM ) 20 MG capsule Take 1 capsule (20 mg total) by mouth daily. 09/28/22  Yes [name], MD  levothyroxine  (SYNTHROID ) 100 MCG tablet Take 1 tablet (100 mcg total) by mouth daily. 12/27/22  Yes Trixie File, MD  Multiple Vitamin (MULTIVITAMIN) tablet Take 1 tablet by mouth daily.   Yes [provider]  nirmatrelvir/ritonavir (PAXLOVID , 300/100,) 20 x 150 MG & 10 x 100MG  TBPK Take 3 tablets by mouth 2 (two) times daily for 5 days. Patient GFR is 62. Take nirmatrelvir (150 mg) two tablets twice daily for 5 days and ritonavir (100 mg) one tablet twice daily for 5 days. 04/29/23 05/04/23 Yes Antrell Tipler R, NP  predniSONE  (DELTASONE ) 20 MG tablet Take 2 tablets (40 mg total) by mouth daily. 04/29/23  Yes Digna Countess, Shelba SAUNDERS, NP  promethazine -dextromethorphan (PROMETHAZINE -DM) 6.25-15 MG/5ML syrup Take 5 mLs by mouth at bedtime as needed. 04/29/23  Yes Rayelle Armor R, NP  rosuvastatin  (CRESTOR ) 10 MG tablet Take 1 tablet (10 mg total) by mouth daily. 08/03/22  Yes [name], MD  sertraline  (ZOLOFT ) 100 MG tablet Take 1 tablet (100 mg total) by mouth at bedtime. 08/02/22 08/02/23 Yes [name], MD  zolpidem  (AMBIEN ) 5 MG tablet Take 1 -  2 tablets (5 - 10 mg total) by mouth at bedtime as needed. 04/06/23  Yes [name], MD  meloxicam  (MOBIC ) 15 MG tablet Take 1 tablet (15 mg total) by mouth daily. 04/05/23   Leonce Katz, DO  mometasone  (ELOCON ) 0.1 % cream qd to bid 5 days a week to aa psoriasis prn flares 01/02/23   Jackquline Sawyer, MD  Tapinarof  (VTAMA ) 1 % CREA Apply to affected areas psoriasis once daily. 08/30/22   Jackquline Sawyer, MD    Family History Family History  Problem Relation Age of Onset    Heart Problems Mother    Hypothyroidism Mother    High blood pressure Father    High Cholesterol Father    Diabetes Brother    Diabetes Maternal Grandmother    High blood pressure Maternal Uncle    Atrial fibrillation Maternal Uncle    Hypothyroidism Maternal Uncle    Heart Problems Maternal Aunt    Cancer Maternal Aunt    Hypothyroidism Maternal Aunt    Cancer Cousin    Breast cancer Cousin    Hypothyroidism Daughter    Hypothyroidism Son     Social History Social History   Tobacco Use   Smoking status: Former    Current packs/day: 0.00    Average packs/day: 1 pack/day for 32.0 years (32.0 ttl pk-yrs)    Types: Cigarettes    Start date: 77    Quit date: 2006    Years since quitting: 19.0   Smokeless tobacco: Never  Vaping Use   Vaping status: Never Used  Substance Use Topics   Alcohol use: Yes    Alcohol/week: 14.0 standard drinks of alcohol    Types: 14 Glasses of wine per week    Comment: 2 glasses per day   Drug use: Never     Allergies   Bactrim, Otezla  [apremilast ], and Penicillins   Review of Systems Review of Systems   Physical Exam Triage Vital Signs ED Triage Vitals  Encounter Vitals Group     BP --      Systolic BP Percentile --      Diastolic BP Percentile --      Pulse Rate 04/29/23 0944 79     Resp 04/29/23 0944 18     Temp 04/29/23 0944 98.9 F (37.2 C)     Temp Source 04/29/23 0944 Oral     SpO2 04/29/23 0944 98 %     Weight --      Height --      Head Circumference --      Peak Flow --      Pain Score 04/29/23 0946 4     Pain Loc --      Pain Education --      Exclude from Growth Chart --    No data found.  Updated Vital Signs Pulse 79   Temp 98.9 F (37.2 C) (Oral)   Resp 18   SpO2 98%   Visual Acuity Right Eye Distance:   Left Eye Distance:   Bilateral Distance:    Right Eye Near:   Left Eye Near:    Bilateral Near:     Physical Exam Constitutional:      Appearance: Normal appearance.  HENT:     Head:  Normocephalic.     Right Ear: Tympanic membrane, ear canal and external ear normal.     Left Ear: Tympanic membrane, ear canal and external ear normal.     Nose: Congestion present. No rhinorrhea.  Mouth/Throat:     Mouth: Mucous membranes are moist.     Pharynx: No oropharyngeal exudate or posterior oropharyngeal erythema.  Eyes:     Extraocular Movements: Extraocular movements intact.  Cardiovascular:     Rate and Rhythm: Normal rate and regular rhythm.     Pulses: Normal pulses.     Heart sounds: Normal heart sounds.  Pulmonary:     Effort: Pulmonary effort is normal.     Breath sounds: Normal breath sounds.  Musculoskeletal:     Cervical back: Normal range of motion and neck supple.  Neurological:     Mental Status: She is alert and oriented to person, place, and time. Mental status is at baseline.      UC Treatments / Results  Labs (all labs ordered are listed, but only abnormal results are displayed) Labs Reviewed  POC COVID19/FLU A&B COMBO - Abnormal; Notable for the following components:      Result Value   Covid Antigen, POC Positive (*)    All other components within normal limits    EKG   Radiology No results found.  Procedures Procedures (including critical care time)  Medications Ordered in UC Medications - No data to display  Initial Impression / Assessment and Plan / UC Course  I have reviewed the triage vital signs and the nursing notes.  Pertinent labs & imaging results that were available during my care of the patient were reviewed by me and considered in my medical decision making (see chart for details).  COVID-19  Patient is in no signs of distress nor toxic appearing.  Vital signs are stable.  Low suspicion for pneumonia, pneumothorax or bronchitis and therefore will defer imaging COVID test positive, discussed etiology as well as quarantine per the [facility], work note given.  Paxlovid  prescribed as well as prednisone , Tessalon  and Promethazine   DM for management.May use additional over-the-counter medications as needed for supportive care.  May follow-up with urgent care as needed if symptoms persist or worsen.   Final Clinical Impressions(s) / UC Diagnoses   Final diagnoses:  COVID-19     Discharge Instructions      Testing is positive for COVID which is a virus, with typical viruses it may take 7 to 14 days before you start to see any improvement in your symptoms  Per the CDC you will only need to quarantine if experiencing fever, when without fever you may continue activity wearing mask  Begin Paxlovid  taking every morning and every evening for 5 days, this medicine helps to reduce the amount of virus in your body which ideally helps to reduce the severity and timeline that you are sick  Begin prednisone  every morning with food for 5 days to help reduce sinus pressure, will also open and relax the airway  You may use Tessalon  pill every 8 hours to help with coughing, may use cough syrup at bedtime for additional comfort    You can take Tylenol and/or Ibuprofen as needed for fever reduction and pain relief.   For cough: honey 1/2 to 1 teaspoon (you can dilute the honey in water or another fluid).  You can also use guaifenesin  and dextromethorphan for cough. You can use a humidifier for chest congestion and cough.  If you don't have a humidifier, you can sit in the bathroom with the hot shower running.      For sore throat: try warm salt water gargles, cepacol lozenges, throat spray, warm tea or water with lemon/honey, popsicles or ice, or  OTC cold relief medicine for throat discomfort.   For congestion: take a daily anti-histamine like Zyrtec, Claritin, and a oral decongestant, such as pseudoephedrine.  You can also use Flonase  1-2 sprays in each nostril daily.   It is important to stay hydrated: drink plenty of fluids (water, gatorade/powerade/pedialyte, juices, or teas) to keep your throat moisturized and help further  relieve irritation/discomfort. .     ED Prescriptions     Medication Sig Dispense Auth. Provider   nirmatrelvir/ritonavir (PAXLOVID , 300/100,) 20 x 150 MG & 10 x 100MG  TBPK Take 3 tablets by mouth 2 (two) times daily for 5 days. Patient GFR is 62. Take nirmatrelvir (150 mg) two tablets twice daily for 5 days and ritonavir (100 mg) one tablet twice daily for 5 days. 30 tablet Suhail Peloquin R, NP   predniSONE  (DELTASONE ) 20 MG tablet Take 2 tablets (40 mg total) by mouth daily. 10 tablet Chelsye Suhre R, NP   benzonatate  (TESSALON ) 100 MG capsule Take 1 capsule (100 mg total) by mouth every 8 (eight) hours. 21 capsule Haydin Dunn R, NP   promethazine -dextromethorphan (PROMETHAZINE -DM) 6.25-15 MG/5ML syrup Take 5 mLs by mouth at bedtime as needed. 118 mL Gunhild Bautch, Shelba SAUNDERS, NP      PDMP not reviewed this encounter.   Teresa Shelba SAUNDERS, NP 04/29/23 1034

## 2023-04-29 NOTE — ED Triage Notes (Signed)
 C/O nasal congestion, cough, HA onset 2 days ago. Denies fevers. Has been taking Tyl and normally takes Allegra daily. Wishes to be checked for Covid and Influenza.

## 2023-04-29 NOTE — Discharge Instructions (Signed)
 Testing is positive for COVID which is a virus, with typical viruses it may take 7 to 14 days before you start to see any improvement in your symptoms  Per the CDC you will only need to quarantine if experiencing fever, when without fever you may continue activity wearing mask  Begin Paxlovid  taking every morning and every evening for 5 days, this medicine helps to reduce the amount of virus in your body which ideally helps to reduce the severity and timeline that you are sick  Begin prednisone  every morning with food for 5 days to help reduce sinus pressure, will also open and relax the airway  You may use Tessalon  pill every 8 hours to help with coughing, may use cough syrup at bedtime for additional comfort    You can take Tylenol and/or Ibuprofen as needed for fever reduction and pain relief.   For cough: honey 1/2 to 1 teaspoon (you can dilute the honey in water or another fluid).  You can also use guaifenesin  and dextromethorphan for cough. You can use a humidifier for chest congestion and cough.  If you don't have a humidifier, you can sit in the bathroom with the hot shower running.      For sore throat: try warm salt water gargles, cepacol lozenges, throat spray, warm tea or water with lemon/honey, popsicles or ice, or OTC cold relief medicine for throat discomfort.   For congestion: take a daily anti-histamine like Zyrtec, Claritin, and a oral decongestant, such as pseudoephedrine.  You can also use Flonase  1-2 sprays in each nostril daily.   It is important to stay hydrated: drink plenty of fluids (water, gatorade/powerade/pedialyte, juices, or teas) to keep your throat moisturized and help further relieve irritation/discomfort. SABRA

## 2023-05-01 ENCOUNTER — Telehealth: Payer: Self-pay

## 2023-05-01 ENCOUNTER — Other Ambulatory Visit (HOSPITAL_COMMUNITY): Payer: Self-pay

## 2023-05-01 ENCOUNTER — Encounter: Payer: Self-pay | Admitting: Family Medicine

## 2023-05-01 ENCOUNTER — Other Ambulatory Visit: Payer: Self-pay | Admitting: Family Medicine

## 2023-05-01 DIAGNOSIS — J301 Allergic rhinitis due to pollen: Secondary | ICD-10-CM

## 2023-05-01 NOTE — Telephone Encounter (Signed)
 Copied from CRM [id number]. Topic: Clinical - Medical Advice >> May 01, 2023  8:44 AM Russell PARAS wrote: Reason for CRM: Pt tested positive for COVID on 01/04 and would like to know how long she will need to wait to receive the next COVID vaccine due to her illness. CB# [phone]  Please Advise on this

## 2023-05-02 ENCOUNTER — Ambulatory Visit: Payer: Self-pay | Admitting: Internal Medicine

## 2023-05-02 ENCOUNTER — Encounter: Payer: Self-pay | Admitting: Family Medicine

## 2023-05-03 MED ORDER — ALBUTEROL SULFATE HFA 108 (90 BASE) MCG/ACT IN AERS: 2.0000 | INHALATION_SPRAY | RESPIRATORY_TRACT | 0 refills | Status: DC | PRN

## 2023-05-03 NOTE — Telephone Encounter (Unsigned)
 Copied from CRM [id number]. Topic: Appointments - Scheduling Inquiry for Clinic >> May 03, 2023 11:20 AM Leotis ORN wrote: Reason for CRM: PT tested positive for covid 01/04/2, was given antiviral and inhaler, pt stated it is not working and that she messaged  Dr, Jodie and was told to come in. Pt does not want to travel, offered in person and virtual appts, pt did not want to wait for the times available, requested a call back [phone]

## 2023-05-08 ENCOUNTER — Other Ambulatory Visit (HOSPITAL_COMMUNITY): Payer: Self-pay

## 2023-05-08 ENCOUNTER — Other Ambulatory Visit: Payer: Self-pay

## 2023-05-09 NOTE — Progress Notes (Deleted)
    Susan Burch D.[facility] [address] [place] [address] Phone: [phone]   Assessment and Plan:     There are no diagnoses linked to this encounter.  ***   Pertinent previous records reviewed include ***    Follow Up: ***     Subjective:   I, Susan Burch, am serving as a Neurosurgeon for Doctor Ulysees Gander   Chief Complaint: right foot pain    HPI:    04/05/2023 Patient is a 68 year old female with right foot pain. Patient states right foot pain and swelling that started about a week ago. 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 No MOI. No meds for the pain . If she is on her feet long enough the pain will radiate up to her knee. Ice and rest seem to help a little. No numbness or tingling.    04/12/2023 Patient states that her foot is still swollen and painful. Isnt able to work 12 hr shifts   05/10/2023 Patient states   Relevant Historical Information: None pertinent  Additional pertinent review of systems negative.   Current Outpatient Medications:    albuterol  (VENTOLIN  HFA) 108 (90 Base) MCG/ACT inhaler, Inhale 2 puffs into the lungs every 4 (four) hours as needed for wheezing or shortness of breath., Disp: 1 each, Rfl: 0   benzonatate  (TESSALON ) 100 MG capsule, Take 1 capsule (100 mg total) by mouth every 8 (eight) hours., Disp: 21 capsule, Rfl: 0   esomeprazole  (NEXIUM ) 20 MG capsule, Take 1 capsule (20 mg total) by mouth daily., Disp: 90 capsule, Rfl: 3   levothyroxine  (SYNTHROID ) 100 MCG tablet, Take 1 tablet (100 mcg total) by mouth daily., Disp: 45 tablet, Rfl: 5   meloxicam  (MOBIC ) 15 MG tablet, Take 1 tablet (15 mg total) by mouth daily., Disp: 30 tablet, Rfl: 0   mometasone  (ELOCON ) 0.1 % cream, qd to bid 5 days a week to aa psoriasis prn flares, Disp: 45 g, Rfl: 0   Multiple Vitamin (MULTIVITAMIN) tablet, Take 1 tablet by mouth daily., Disp: , Rfl:    predniSONE  (DELTASONE ) 20 MG tablet, Take 2  tablets (40 mg total) by mouth daily., Disp: 10 tablet, Rfl: 0   promethazine -dextromethorphan (PROMETHAZINE -DM) 6.25-15 MG/5ML syrup, Take 5 mLs by mouth at bedtime as needed., Disp: 118 mL, Rfl: 0   rosuvastatin  (CRESTOR ) 10 MG tablet, Take 1 tablet (10 mg total) by mouth daily., Disp: 90 tablet, Rfl: 3   sertraline  (ZOLOFT ) 100 MG tablet, Take 1 tablet (100 mg total) by mouth at bedtime., Disp: 90 tablet, Rfl: 3   Tapinarof  (VTAMA ) 1 % CREA, Apply to affected areas psoriasis once daily., Disp: 60 g, Rfl: 2   zolpidem  (AMBIEN ) 5 MG tablet, Take 1 - 2 tablets (5 - 10 mg total) by mouth at bedtime as needed., Disp: 60 tablet, Rfl: 5   Objective:     There were no vitals filed for this visit.    There is no height or weight on file to calculate BMI.    Physical Exam:    ***   Electronically signed by:  Marshall Skeeter D.[facility] 7:28 AM 05/09/23

## 2023-05-10 ENCOUNTER — Ambulatory Visit: Payer: Commercial Managed Care - PPO | Admitting: Sports Medicine

## 2023-05-11 ENCOUNTER — Other Ambulatory Visit (HOSPITAL_COMMUNITY): Payer: Self-pay

## 2023-05-11 ENCOUNTER — Encounter: Payer: Self-pay | Admitting: Family Medicine

## 2023-05-11 ENCOUNTER — Ambulatory Visit: Payer: Commercial Managed Care - PPO | Admitting: Family Medicine

## 2023-05-11 VITALS — BP 138/87 | HR 78 | Temp 98.4°F | Ht 62.0 in | Wt 217.8 lb

## 2023-05-11 DIAGNOSIS — J0110 Acute frontal sinusitis, unspecified: Secondary | ICD-10-CM

## 2023-05-11 DIAGNOSIS — U071 COVID-19: Secondary | ICD-10-CM | POA: Diagnosis not present

## 2023-05-11 DIAGNOSIS — J432 Centrilobular emphysema: Secondary | ICD-10-CM

## 2023-05-11 DIAGNOSIS — Z87891 Personal history of nicotine dependence: Secondary | ICD-10-CM | POA: Diagnosis not present

## 2023-05-11 MED ORDER — LEVOFLOXACIN 500 MG PO TABS
500.0000 mg | ORAL_TABLET | Freq: Every day | ORAL | 0 refills | Status: DC
  Filled 2023-05-11: qty 7, 7d supply, fill #0

## 2023-05-11 NOTE — Progress Notes (Signed)
Subjective  CC:  Chief Complaint  Patient presents with   Cough    Pt stated that she has been coughing for the past week. Tested COVID positive on 04/29/2023. Still congested and a productive cough.   Headache    HPI: Susan Burch is a 68 y.o. female who presents to the office today to address the problems listed above in the chief complaint. Discussed the use of AI scribe software for clinical note transcription with the patient, who gave verbal consent to proceed.  History of Present Illness   The patient, with a history of smoking and recent COVID-19 infection, presents with persistent symptoms of congestion, productive cough, and headaches. She also reports epistaxis and localized sinus pain. Despite taking Paxlovid, the patient's symptoms have only improved marginally. She denies any body aches, fevers, or breathing difficulties.  The patient's symptoms have been ongoing for a couple of weeks, longer than her family members who also had COVID-19. She has a history of sinus infections, previously treated with Z-Pak and Levaquin. The patient reports allergies to penicillins and Bactrim. She has a preference for Levaquin, which she believes has been effective in the past.  The patient has been provided with a cough medicine, but she is still experiencing productive cough and mucus production. She denies any significant coughing episodes.       Assessment  1. Subacute frontal sinusitis   2. COVID-19   3. Centrilobular emphysema (HCC)   4. Former smoker, 32 pack year history, quit 2006      Plan  Assessment and Plan    Sinusitis Persistent sinus congestion, productive cough, headache, and epistaxis following COVID-19 infection for a couple of weeks. Lungs clear, no respiratory distress. Differential includes bacterial sinusitis, considering prolonged symptoms and smoking history. Discussed Levaquin's effectiveness and tendonitis risk. Patient prefers Levaquin due to past positive  experiences; Z-Pak was ineffective previously. - Prescribe Levaquin - Recommend Mucinex DM to thin mucus  Bronchitis Productive cough and drainage suggestive of bronchitis. Lungs clear, no severe respiratory distress. Smoking history increases risk. Discussed Levaquin's effectiveness and tendonitis risk. Patient prefers Levaquin due to past positive experiences; Z-Pak was ineffective previously. - Prescribe Levaquin - Recommend Mucinex DM to thin mucus  Follow-up - Confirm next appointment or physical date.        No orders of the defined types were placed in this encounter.  Meds ordered this encounter  Medications   levofloxacin (LEVAQUIN) 500 MG tablet    Sig: Take 1 tablet (500 mg total) by mouth daily.    Dispense:  7 tablet    Refill:  0     I reviewed the patients updated PMH, FH, and SocHx.    Patient Active Problem List   Diagnosis Date Noted   Centrilobular emphysema (HCC) 12/11/2019    Priority: High   Obesity (BMI 30-39.9) 06/12/2019    Priority: High   Mixed hyperlipidemia 02/20/2019    Priority: High   Situational mixed anxiety and depressive disorder 11/12/2018    Priority: High   Polycythemia, mild, evaluation by Dr Candise Che 2020 09/05/2018    Priority: High   Bilateral lower extremity edema 04/11/2018    Priority: High   Sleep initiation dysfunction 09/27/2011    Priority: High   Hypothyroidism (acquired) 11/06/2006    Priority: High   Former smoker, 32 pack year history, quit 2006 11/12/2018    Priority: Medium    Episodic tension-type headache, not intractable 11/12/2018    Priority: Medium  Mild persistent asthma 09/21/2016    Priority: Medium    Postmenopausal disorder 02/14/2013    Priority: Medium    GERD 11/06/2006    Priority: Medium    Allergic rhinitis 11/06/2006    Priority: Low   GAD (generalized anxiety disorder) 07/27/2022   Greater trochanteric bursitis of both hips 11/28/2006   Current Meds  Medication Sig   albuterol  (VENTOLIN HFA) 108 (90 Base) MCG/ACT inhaler Inhale 2 puffs into the lungs every 4 (four) hours as needed for wheezing or shortness of breath.   benzonatate (TESSALON) 100 MG capsule Take 1 capsule (100 mg total) by mouth every 8 (eight) hours.   esomeprazole (NEXIUM) 20 MG capsule Take 1 capsule (20 mg total) by mouth daily.   levofloxacin (LEVAQUIN) 500 MG tablet Take 1 tablet (500 mg total) by mouth daily.   levothyroxine (SYNTHROID) 100 MCG tablet Take 1 tablet (100 mcg total) by mouth daily.   meloxicam (MOBIC) 15 MG tablet Take 1 tablet (15 mg total) by mouth daily.   mometasone (ELOCON) 0.1 % cream qd to bid 5 days a week to aa psoriasis prn flares   Multiple Vitamin (MULTIVITAMIN) tablet Take 1 tablet by mouth daily.   predniSONE (DELTASONE) 20 MG tablet Take 2 tablets (40 mg total) by mouth daily.   promethazine-dextromethorphan (PROMETHAZINE-DM) 6.25-15 MG/5ML syrup Take 5 mLs by mouth at bedtime as needed.   rosuvastatin (CRESTOR) 10 MG tablet Take 1 tablet (10 mg total) by mouth daily.   sertraline (ZOLOFT) 100 MG tablet Take 1 tablet (100 mg total) by mouth at bedtime.   Tapinarof (VTAMA) 1 % CREA Apply to affected areas psoriasis once daily.   zolpidem (AMBIEN) 5 MG tablet Take 1 - 2 tablets (5 - 10 mg total) by mouth at bedtime as needed.    Allergies: Patient is allergic to bactrim, otezla [apremilast], and penicillins. Family History: Patient family history includes Atrial fibrillation in her maternal uncle; Breast cancer in her cousin; Cancer in her cousin and maternal aunt; Diabetes in her brother and maternal grandmother; Heart Problems in her maternal aunt and mother; High Cholesterol in her father; High blood pressure in her father and maternal uncle; Hypothyroidism in her daughter, maternal aunt, maternal uncle, mother, and son. Social History:  Patient  reports that she quit smoking about 19 years ago. Her smoking use included cigarettes. She started smoking about 51  years ago. She has a 32 pack-year smoking history. She has never used smokeless tobacco. She reports current alcohol use of about 14.0 standard drinks of alcohol per week. She reports that she does not use drugs.  Review of Systems: Constitutional: Negative for fever malaise or anorexia Cardiovascular: negative for chest pain Respiratory: negative for SOB or persistent cough Gastrointestinal: negative for abdominal pain  Objective  Vitals: BP 138/87   Pulse 78   Temp 98.4 F (36.9 C)   Ht 5\' 2"  (1.575 m)   Wt 217 lb 12.8 oz (98.8 kg)   SpO2 95%   BMI 39.84 kg/m  General: no acute distress , A&Ox3 HEENT: PEERL, conjunctiva normal, neck is supple, + frontal sinus ttp Cardiovascular:  RRR without murmur or gallop.  Respiratory:  Good breath sounds bilaterally, CTAB with normal respiratory effort, no wheezing or rales Skin:  Warm, no rashes  Commons side effects, risks, benefits, and alternatives for medications and treatment plan prescribed today were discussed, and the patient expressed understanding of the given instructions. Patient is instructed to call or message via MyChart if  he/she has any questions or concerns regarding our treatment plan. No barriers to understanding were identified. We discussed Red Flag symptoms and signs in detail. Patient expressed understanding regarding what to do in case of urgent or emergency type symptoms.  Medication list was reconciled, printed and provided to the patient in AVS. Patient instructions and summary information was reviewed with the patient as documented in the AVS. This note was prepared with assistance of Dragon voice recognition software. Occasional wrong-word or sound-a-like substitutions may have occurred due to the inherent limitations of voice recognition software

## 2023-05-11 NOTE — Patient Instructions (Addendum)
Please return in 3 months for your annual complete physical; please come fasting.    If you have any questions or concerns, please don't hesitate to send me a message via MyChart or call the office at [phone]. Thank you for visiting with [place] today! It's our pleasure caring for you.   VISIT SUMMARY:  During your visit, we discussed your ongoing symptoms of congestion, productive cough, headaches, and sinus pain following your recent COVID-19 infection. Despite taking Paxlovid, your symptoms have only slightly improved. We reviewed your history of sinus infections and your allergies to certain antibiotics. You also mentioned your preference for Levaquin, which has been effective for you in the past.  YOUR PLAN:  -SINUSITIS: Sinusitis is an inflammation of the sinuses that can cause congestion, headaches, and mucus production. Given your prolonged symptoms and smoking history, we suspect a bacterial sinus infection. We have prescribed Levaquin, an antibiotic, and recommend Mucinex DM to help thin the mucus.  -BRONCHITIS: Bronchitis is an inflammation of the bronchial tubes, leading to coughing and mucus production. Your smoking history increases the risk of bronchitis. We have prescribed Levaquin, an antibiotic, and recommend Mucinex DM to help thin the mucus.  INSTRUCTIONS:  Please confirm your next appointment or physical date.

## 2023-05-12 ENCOUNTER — Telehealth: Payer: Self-pay

## 2023-05-12 ENCOUNTER — Encounter: Payer: Self-pay | Admitting: Family Medicine

## 2023-05-12 NOTE — Telephone Encounter (Signed)
Copied from CRM [id number]. Topic: Clinical - Prescription Issue >> May 12, 2023 12:58 PM [name] wrote: Reason for CRM: Patient called in due to Antibiotic levofloxacin (LEVAQUIN) 500 MG tablet giving bad headache and would like to know if Dr. Mardelle Matte could prescribe a different kind /please call [phone]  Please Advise.  Message has been sent to Dr. Mardelle Matte to address

## 2023-05-15 ENCOUNTER — Other Ambulatory Visit: Payer: Self-pay

## 2023-05-15 ENCOUNTER — Other Ambulatory Visit (HOSPITAL_COMMUNITY): Payer: Self-pay

## 2023-05-15 MED ORDER — DOXYCYCLINE HYCLATE 100 MG PO TABS
100.0000 mg | ORAL_TABLET | Freq: Two times a day (BID) | ORAL | 0 refills | Status: DC
  Filled 2023-05-15: qty 14, 7d supply, fill #0

## 2023-05-19 NOTE — Progress Notes (Unsigned)
[name] [address] [place] [address] Phone: [phone] Subjective:   Susan Burch am a scribe for Dr. Katrinka Blazing.   I'm seeing this patient by the request  of:  Willow Ora, MD  CC: Right foot and ankle pain follow-up  [id number]  01/12/2023 Chronic problem with exacerbation noted. Has had this intermittently for quite some time and last injection was greater than 2 years ago. Differential includes lumbar radiculopathy and could consider other medications. Discussed home exercises and icing regimen. Discussed which activities to do and which ones to avoid. Follow-up with me again 2 to 3 months. Worsening pain advanced imaging could be warranted.   Updated 05/23/2023 Susan Burch is a 68 y.o. female coming in with complaint of R foot pain. Patient had injection from Dr. Jean Rosenthal but continues to have pain in the distal peroneal tendon. Insidious onset in November. Painful to invert ankle and to slide foot into shoe. Has to wear supportive shoes.  Patient would state that she does feel 75% better but is having more discomfort and pain over the first toe.  Does have a bunion she states.       Past Medical History:  Diagnosis Date   Centrilobular emphysema (HCC) 12/11/2019   Mild noted on CT for lung cancer screen 2021; former long term smoker.   GERD without esophagitis    HLD (hyperlipidemia)    Hypothyroidism    Mixed hyperlipidemia 02/20/2019   Psoriasis    Sleep initiation dysfunction 09/27/2011   Past Surgical History:  Procedure Laterality Date   BREAST EXCISIONAL BIOPSY Right 1990   CESAREAN SECTION  1994, 1996   CHOLECYSTECTOMY  1998   MYOMECTOMY  1993   Social History   Socioeconomic History   Marital status: Married    Spouse name: Not on file   Number of children: Not on file   Years of education: Not on file   Highest education level: Not on file  Occupational History   Occupation: Charity fundraiser    Employer: [employer]  Tobacco Use   Smoking status: Former    Current packs/day: 0.00    Average packs/day: 1 pack/day for 32.0 years (32.0 ttl pk-yrs)    Types: Cigarettes    Start date: 24    Quit date: 2006    Years since quitting: 19.0   Smokeless tobacco: Never  Vaping Use   Vaping status: Never Used  Substance and Sexual Activity   Alcohol use: Yes    Alcohol/week: 14.0 standard drinks of alcohol    Types: 14 Glasses of wine per week    Comment: 2 glasses per day   Drug use: Never   Sexual activity: Not on file  Other Topics Concern   Not on file  Social History Narrative   Not on file   Social Drivers of Health   Financial Resource Strain: Not on file  Food Insecurity: Not on file  Transportation Needs: Not on file  Physical Activity: Not on file  Stress: Not on file  Social Connections: Not on file   Allergies  Allergen Reactions   Bactrim    Otezla [Apremilast] Hives   Penicillins    Family History  Problem Relation Age of Onset   Heart Problems Mother    Hypothyroidism Mother    High blood pressure Father    High Cholesterol Father    Diabetes Brother    Diabetes Maternal Grandmother    High blood pressure Maternal  Uncle    Atrial fibrillation Maternal Uncle    Hypothyroidism Maternal Uncle    Heart Problems Maternal Aunt    Cancer Maternal Aunt    Hypothyroidism Maternal Aunt    Cancer Cousin    Breast cancer Cousin    Hypothyroidism Daughter    Hypothyroidism Son     Current Outpatient Medications (Endocrine & Metabolic):    levothyroxine (SYNTHROID) 100 MCG tablet, Take 1 tablet (100 mcg total) by mouth daily.   predniSONE (DELTASONE) 20 MG tablet, Take 2 tablets (40 mg total) by mouth daily.  Current Outpatient Medications (Cardiovascular):    rosuvastatin (CRESTOR) 10 MG tablet, Take 1 tablet (10 mg total) by mouth daily.  Current Outpatient Medications (Respiratory):    albuterol (VENTOLIN HFA) 108 (90 Base) MCG/ACT inhaler, Inhale 2 puffs into  the lungs every 4 (four) hours as needed for wheezing or shortness of breath.   benzonatate (TESSALON) 100 MG capsule, Take 1 capsule (100 mg total) by mouth every 8 (eight) hours.   promethazine-dextromethorphan (PROMETHAZINE-DM) 6.25-15 MG/5ML syrup, Take 5 mLs by mouth at bedtime as needed.  Current Outpatient Medications (Analgesics):    meloxicam (MOBIC) 15 MG tablet, Take 1 tablet (15 mg total) by mouth daily.   Current Outpatient Medications (Other):    doxycycline (VIBRA-TABS) 100 MG tablet, Take 1 tablet (100 mg total) by mouth 2 (two) times daily.   esomeprazole (NEXIUM) 20 MG capsule, Take 1 capsule (20 mg total) by mouth daily.   levofloxacin (LEVAQUIN) 500 MG tablet, Take 1 tablet (500 mg total) by mouth daily.   mometasone (ELOCON) 0.1 % cream, qd to bid 5 days a week to aa psoriasis prn flares   Multiple Vitamin (MULTIVITAMIN) tablet, Take 1 tablet by mouth daily.   sertraline (ZOLOFT) 100 MG tablet, Take 1 tablet (100 mg total) by mouth at bedtime.   Tapinarof (VTAMA) 1 % CREA, Apply to affected areas psoriasis once daily.   zolpidem (AMBIEN) 5 MG tablet, Take 1 - 2 tablets (5 - 10 mg total) by mouth at bedtime as needed.   Reviewed prior external information including notes and imaging from  primary care provider As well as notes that were available from care everywhere and other healthcare systems.  Past medical history, social, surgical and family history all reviewed in electronic medical record.  No pertanent information unless stated regarding to the chief complaint.   Review of Systems:  No headache, visual changes, nausea, vomiting, diarrhea, constipation, dizziness, abdominal pain, skin rash, fevers, chills, night sweats, weight loss, swollen lymph nodes, body aches, joint swelling, chest pain, shortness of breath, mood changes. POSITIVE muscle aches  Objective  Blood pressure (!) 140/80, pulse 91, height 5\' 2"  (1.575 m), weight 220 lb (99.8 kg), SpO2 97%.    General: No apparent distress alert and oriented x3 mood and affect normal, dressed appropriately.  HEENT: Pupils equal, extraocular movements intact  Respiratory: Patient's speak in full sentences and does not appear short of breath  Cardiovascular: No lower extremity edema, non tender, no erythema  Right ankle has significant improvement in range of motion noted.  No pain over the peroneal tendon.  Patient does have a bunion that formation on the fifth but bunion formation that is fairly severe with fibular deviation of the large toe.  Patient has limited range of motion in flexion and extension noted.   Procedure: Real-time Ultrasound Guided Injection of right first MTP Device: GE Logiq Q7 Ultrasound guided injection is preferred based studies that show increased  duration, increased effect, greater accuracy, decreased procedural pain, increased response rate, and decreased cost with ultrasound guided versus blind injection.  Verbal informed consent obtained.  Time-out conducted.  Noted no overlying erythema, induration, or other signs of local infection.  Skin prepped in a sterile fashion.  Local anesthesia: Topical Ethyl chloride.  With sterile technique and under real time ultrasound guidance: With a 25-gauge half inch needle injected with 0.5 cc of 0.5% Marcaine and 0.5 cc of Kenalog 40 mg/mL. Completed without difficulty  Pain immediately resolved suggesting accurate placement of the medication.  Advised to call if fevers/chills, erythema, induration, drainage, or persistent bleeding.  Impression: Technically successful ultrasound guided injection.   Impression and Recommendations:     The above documentation has been reviewed and is accurate and complete Judi Saa, DO

## 2023-05-23 ENCOUNTER — Ambulatory Visit: Payer: Commercial Managed Care - PPO | Admitting: Family Medicine

## 2023-05-23 ENCOUNTER — Encounter: Payer: Self-pay | Admitting: Family Medicine

## 2023-05-23 ENCOUNTER — Other Ambulatory Visit: Payer: Self-pay

## 2023-05-23 VITALS — BP 140/80 | HR 91 | Ht 62.0 in | Wt 220.0 lb

## 2023-05-23 DIAGNOSIS — M216X1 Other acquired deformities of right foot: Secondary | ICD-10-CM | POA: Insufficient documentation

## 2023-05-23 DIAGNOSIS — M7061 Trochanteric bursitis, right hip: Secondary | ICD-10-CM | POA: Diagnosis not present

## 2023-05-23 DIAGNOSIS — M19071 Primary osteoarthritis, right ankle and foot: Secondary | ICD-10-CM | POA: Diagnosis not present

## 2023-05-23 DIAGNOSIS — M7062 Trochanteric bursitis, left hip: Secondary | ICD-10-CM

## 2023-05-23 NOTE — Assessment & Plan Note (Signed)
New problem.  Patient given injection and tolerated the procedure well.  Discussed icing regimen and home exercises, proper shoes, I do believe patient will do well with the conservative therapy.  Do believe that patient needs to have the support of the metatarsal with the breakdown of the transverse arch that will need to be monitored.  Will follow-up again in 8 weeks otherwise

## 2023-05-23 NOTE — Patient Instructions (Addendum)
Injected great toe today.  [employer]. Avoid beings barefoot.  See you in 2 months.

## 2023-05-25 ENCOUNTER — Ambulatory Visit: Payer: Commercial Managed Care - PPO | Admitting: Internal Medicine

## 2023-05-25 VITALS — BP 120/80 | HR 79 | Ht 62.0 in | Wt 219.0 lb

## 2023-05-25 DIAGNOSIS — E039 Hypothyroidism, unspecified: Secondary | ICD-10-CM | POA: Diagnosis not present

## 2023-05-25 NOTE — Patient Instructions (Signed)
Please stop at the lab.  Please continue Levothyroxine 100 mcg daily.  Take the thyroid hormone every day, with water, at least 30 minutes before breakfast, separated by at least 4 hours from: - acid reflux medications - calcium - iron - multivitamins  Please come back for a follow-up appointment in 1 year, but for labs in 6 months.

## 2023-05-25 NOTE — Progress Notes (Signed)
Patient ID: Susan Burch, female   DOB: Mar 27, 1956, 68 y.o.   MRN: [id number]   HPI  Susan Burch is a 68 y.o.-year-old female, returning for follow-up for uncontrolled, acquired, hypothyroidism.  Last visit 1 year ago.  Interim history: She feels well at this visit, without complaints except weight gain. She was able to lose a significant amount of weight before last visit, on Wegovy, but unfortunately gained back 32 pounds afterwards as her insurance does not cover Wegovy anymore. She recently had COVID-19 and was on steroids.  This resolved.  Reviewed history: Pt. has been dx with hypothyroidism in 1997-1998 after the birth of her son.  She was started on Synthroid then.  When I first saw her, she was on 175 mcg daily, however, she was not taking the medication correctly.  We were able to decrease the dose after she started to take it correctly.  She switched from Synthroid d.a.w. to generic levothyroxine since last visit (prescription sent by PCP).  She does not feel a difference.  Pt is on levothyroxine 100 mcg daily (dose changed 03/2022), taken: - in am - fasting - at least 30 min from b'fast and coffee + creamer - no Ca, Fe - + MVI at night, PPIs (Nexium) at lunchtime - + on Biotin (B complex) 300 mcg - last dose yesterday.  Reviewed her TFTs: Lab Results  Component Value Date   TSH 1.25 10/05/2022   TSH 1.78 07/27/2022   TSH 0.25 (L) 03/30/2022   TSH 0.11 (L) 01/27/2022   TSH 1.31 05/06/2021   TSH 0.10 (L) 03/25/2021   TSH 0.62 05/20/2020   TSH 6.48 (H) 04/02/2020   TSH 14.68 (H) 02/12/2020   TSH 3.24 02/13/2019   FREET4 0.75 10/05/2022   FREET4 1.07 03/30/2022   FREET4 1.02 05/06/2021   FREET4 1.16 03/25/2021   FREET4 1.01 05/20/2020   FREET4 0.84 04/02/2020   FREET4 0.71 02/12/2020   FREET4 0.96 02/13/2019   FREET4 1.03 06/05/2018   FREET4 1.12 02/15/2018   T3FREE 3.5 05/30/2017   Pt denies: - feeling nodules in neck - hoarseness - choking She had  occasional dysphagia due to GERD >> resolved on Nexium.  She has + FH of thyroid disorders in: M and M aunt and uncle. Also all her children - hypothyroidism, M aunt. No FH of thyroid cancer. No h/o radiation tx to head or neck. No herbal supplements. No Biotin use. On vitamin C, Zn, vitamin D 2000 units daily. She was on Predisone high dose  for 5 days for Covid at the beginning of 04/2023.  She was previously getting steroid injections for psoriasis of scalp.  Pt. also has a history of asthma with cough.  She gets as needed steroids. She also has polycythemia. In 2021 she was diagnosed with mild emphysema on a chest CT.   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  ROS: + see HPI  I reviewed pt's medications, allergies, PMH, social hx, family hx, and changes were documented in the history of present illness. Otherwise, unchanged from my initial visit note.  Past Medical History:  Diagnosis Date   Centrilobular emphysema (HCC) 12/11/2019   Mild noted on CT for lung cancer screen 2021; former long term smoker.   GERD without esophagitis    HLD (hyperlipidemia)    Hypothyroidism    Mixed hyperlipidemia 02/20/2019   Psoriasis    Sleep initiation dysfunction 09/27/2011   Past Surgical History:  Procedure Laterality Date  BREAST EXCISIONAL BIOPSY Right 1990   CESAREAN SECTION  1994, 1996   CHOLECYSTECTOMY  1998   MYOMECTOMY  1993   Social History   Socioeconomic History   Marital status: Married    Spouse name: Not on file   Number of children: 2          Occupational History   RN   Smoking status: Former Smoker    Packs/day: 0.50    Years: 4.00    Pack years: 2.00    Types: Cigarettes    Last attempt to quit: 07/12/2004    Years since quitting: 13.1   Smokeless tobacco: Never Used  Substance and Sexual Activity   Alcohol use: Yes    Alcohol/week: 8.4 oz    Types: 14 Glasses of red wine per week    Comment: 2 glasses per day   Drug use: No   Current Outpatient  Medications on File Prior to Visit  Medication Sig Dispense Refill   albuterol (VENTOLIN HFA) 108 (90 Base) MCG/ACT inhaler Inhale 2 puffs into the lungs every 4 (four) hours as needed for wheezing or shortness of breath. 1 each 0   benzonatate (TESSALON) 100 MG capsule Take 1 capsule (100 mg total) by mouth every 8 (eight) hours. 21 capsule 0   doxycycline (VIBRA-TABS) 100 MG tablet Take 1 tablet (100 mg total) by mouth 2 (two) times daily. 14 tablet 0   esomeprazole (NEXIUM) 20 MG capsule Take 1 capsule (20 mg total) by mouth daily. 90 capsule 3   levofloxacin (LEVAQUIN) 500 MG tablet Take 1 tablet (500 mg total) by mouth daily. 7 tablet 0   levothyroxine (SYNTHROID) 100 MCG tablet Take 1 tablet (100 mcg total) by mouth daily. 45 tablet 5   meloxicam (MOBIC) 15 MG tablet Take 1 tablet (15 mg total) by mouth daily. 30 tablet 0   mometasone (ELOCON) 0.1 % cream qd to bid 5 days a week to aa psoriasis prn flares 45 g 0   Multiple Vitamin (MULTIVITAMIN) tablet Take 1 tablet by mouth daily.     predniSONE (DELTASONE) 20 MG tablet Take 2 tablets (40 mg total) by mouth daily. 10 tablet 0   promethazine-dextromethorphan (PROMETHAZINE-DM) 6.25-15 MG/5ML syrup Take 5 mLs by mouth at bedtime as needed. 118 mL 0   rosuvastatin (CRESTOR) 10 MG tablet Take 1 tablet (10 mg total) by mouth daily. 90 tablet 3   sertraline (ZOLOFT) 100 MG tablet Take 1 tablet (100 mg total) by mouth at bedtime. 90 tablet 3   Tapinarof (VTAMA) 1 % CREA Apply to affected areas psoriasis once daily. 60 g 2   zolpidem (AMBIEN) 5 MG tablet Take 1 - 2 tablets (5 - 10 mg total) by mouth at bedtime as needed. 60 tablet 5   No current facility-administered medications on file prior to visit.   Allergies  Allergen Reactions   Bactrim    Otezla [Apremilast] Hives   Penicillins    FH: Diabetes in brother, MGM HTN in father, maternal uncle HL in father Heart disease in mother, maternal aunt, maternal uncle Thyroid problems-see  HPI Cancer in maternal aunt and first cousin: Multiple myeloma, lymphoma, respectively  PE: BP 120/80   Pulse 79   Ht 5\' 2"  (1.575 m)   Wt 219 lb (99.3 kg)   SpO2 96%   BMI 40.06 kg/m  Wt Readings from Last 10 Encounters:  05/25/23 219 lb (99.3 kg)  05/23/23 220 lb (99.8 kg)  05/11/23 217 lb 12.8 oz (98.8 kg)  04/12/23 214 lb (97.1 kg)  04/05/23 214 lb (97.1 kg)  01/12/23 209 lb (94.8 kg)  12/19/22 204 lb (92.5 kg)  07/27/22 189 lb 3.2 oz (85.8 kg)  03/30/22 188 lb 9.6 oz (85.5 kg)  01/27/22 182 lb 9.6 oz (82.8 kg)   Constitutional: overweight, in NAD Eyes: EOMI, no exophthalmos ENT: no thyromegaly, no cervical lymphadenopathy Cardiovascular: RRR, No MRG Respiratory: CTA B Musculoskeletal: no deformities Skin: no rashes Neurological: no tremor with outstretched hands  ASSESSMENT: 1. Hypothyroidism  PLAN:  1. Patient with longstanding, acquired hypothyroidism, on levothyroxine therapy.  Her TSH levels fluctuated in the past most likely due to incorrect levothyroxine administration.  We separated multivitamins with coffee, levothyroxine and we were able to back off her levothyroxine dose afterwards, with the last dose change at the last visit a year ago - latest thyroid labs reviewed with pt. >> normal: Lab Results  Component Value Date   TSH 1.25 10/05/2022  - she continues on LT4 100 mcg daily - pt feels good on this dose.  Before last visit she lost 26 pounds on Wegovy, which was likely the reason for the decrease levothyroxine requirement.  At last visit her weight was 188 pounds, but unfortunately since then she gained 31 pounds back.  We did discuss that with significant weight gain, we may need to adjust the LT4 dose up. - we discussed about taking the thyroid hormone every day, with water, >30 minutes before breakfast, separated by >4 hours from acid reflux medications, calcium, iron, multivitamins. Pt. is taking it correctly. - will check thyroid tests today: TSH  and fT4 - If labs are abnormal, she will need to return for repeat TFTs in 1.5 months - I plan to see her back in a year, but possibly sooner for labs  She needs refills - 90 days.  Office Visit on 05/25/2023  Component Date Value Ref Range Status   TSH 05/25/2023 23.97 (H)  0.40 - 4.50 mIU/L Final   Free T4 05/25/2023 1.1  0.8 - 1.8 ng/dL Final  TSH is very high, this is possibly related to the weight gain.  I will advise him to increase the dose of LT4 back to 112 mcg daily and recheck her testing 1.5 months.  Carlus Pavlov, MD PhD [facility]

## 2023-05-26 ENCOUNTER — Other Ambulatory Visit: Payer: Self-pay | Admitting: Family Medicine

## 2023-05-26 ENCOUNTER — Encounter: Payer: Self-pay | Admitting: Internal Medicine

## 2023-05-26 ENCOUNTER — Other Ambulatory Visit (HOSPITAL_COMMUNITY): Payer: Self-pay

## 2023-05-26 DIAGNOSIS — Z1231 Encounter for screening mammogram for malignant neoplasm of breast: Secondary | ICD-10-CM

## 2023-05-26 LAB — TSH: TSH: 23.97 m[IU]/L — ABNORMAL HIGH (ref 0.40–4.50)

## 2023-05-26 LAB — T4, FREE: Free T4: 1.1 ng/dL (ref 0.8–1.8)

## 2023-05-26 MED ORDER — LEVOTHYROXINE SODIUM 112 MCG PO TABS
112.0000 ug | ORAL_TABLET | Freq: Every day | ORAL | 7 refills | Status: DC
  Filled 2023-05-26: qty 45, 45d supply, fill #0

## 2023-06-07 ENCOUNTER — Other Ambulatory Visit (HOSPITAL_COMMUNITY): Payer: Self-pay

## 2023-06-14 ENCOUNTER — Ambulatory Visit: Payer: Commercial Managed Care - PPO

## 2023-06-22 ENCOUNTER — Other Ambulatory Visit (HOSPITAL_COMMUNITY): Payer: Self-pay

## 2023-06-28 ENCOUNTER — Ambulatory Visit: Payer: Commercial Managed Care - PPO

## 2023-07-04 ENCOUNTER — Other Ambulatory Visit (HOSPITAL_COMMUNITY): Payer: Self-pay

## 2023-07-05 ENCOUNTER — Other Ambulatory Visit: Payer: Commercial Managed Care - PPO

## 2023-07-05 DIAGNOSIS — E039 Hypothyroidism, unspecified: Secondary | ICD-10-CM | POA: Diagnosis not present

## 2023-07-06 LAB — TSH: TSH: 10.29 m[IU]/L — ABNORMAL HIGH (ref 0.40–4.50)

## 2023-07-06 LAB — T4, FREE: Free T4: 1.3 ng/dL (ref 0.8–1.8)

## 2023-07-10 ENCOUNTER — Other Ambulatory Visit: Payer: Self-pay | Admitting: Internal Medicine

## 2023-07-10 ENCOUNTER — Encounter: Payer: Self-pay | Admitting: Internal Medicine

## 2023-07-10 ENCOUNTER — Other Ambulatory Visit (HOSPITAL_COMMUNITY): Payer: Self-pay

## 2023-07-10 DIAGNOSIS — E039 Hypothyroidism, unspecified: Secondary | ICD-10-CM

## 2023-07-10 MED ORDER — LEVOTHYROXINE SODIUM 125 MCG PO TABS
125.0000 ug | ORAL_TABLET | Freq: Every day | ORAL | 5 refills | Status: DC
  Filled 2023-07-10: qty 45, 45d supply, fill #0
  Filled 2023-08-22: qty 45, 45d supply, fill #1
  Filled 2023-09-22 – 2023-09-27 (×2): qty 45, 45d supply, fill #2
  Filled 2023-11-06: qty 45, 45d supply, fill #3

## 2023-07-12 ENCOUNTER — Ambulatory Visit
Admission: RE | Admit: 2023-07-12 | Discharge: 2023-07-12 | Disposition: A | Discharge status: Home / Self Care | Source: Ambulatory Visit | Attending: Family Medicine | Admitting: Family Medicine

## 2023-07-12 DIAGNOSIS — Z1231 Encounter for screening mammogram for malignant neoplasm of breast: Secondary | ICD-10-CM | POA: Diagnosis not present

## 2023-07-25 NOTE — Progress Notes (Deleted)
 [name] [address] [place] [address] Phone: [phone] Subjective:    I'm seeing this patient by the request  of:  Willow Ora, MD  CC:   [id number]  05/23/2023 New problem.  Patient given injection and tolerated the procedure well.  Discussed icing regimen and home exercises, proper shoes, I do believe patient will do well with the conservative therapy.  Do believe that patient needs to have the support of the metatarsal with the breakdown of the transverse arch that will need to be monitored.  Will follow-up again in 8 weeks otherwise     Update 07/26/2023 Susan Burch is a 68 y.o. female coming in with complaint of R foot, great toe pain. Patient states     Past Medical History:  Diagnosis Date   Centrilobular emphysema (HCC) 12/11/2019   Mild noted on CT for lung cancer screen 2021; former long term smoker.   GERD without esophagitis    HLD (hyperlipidemia)    Hypothyroidism    Mixed hyperlipidemia 02/20/2019   Psoriasis    Sleep initiation dysfunction 09/27/2011   Past Surgical History:  Procedure Laterality Date   BREAST EXCISIONAL BIOPSY Right 1990   CESAREAN SECTION  1994, 1996   CHOLECYSTECTOMY  1998   MYOMECTOMY  1993   Social History   Socioeconomic History   Marital status: Married    Spouse name: Not on file   Number of children: Not on file   Years of education: Not on file   Highest education level: Not on file  Occupational History   Occupation: Charity fundraiser    Employer: [employer]  Tobacco Use   Smoking status: Former    Current packs/day: 0.00    Average packs/day: 1 pack/day for 32.0 years (32.0 ttl pk-yrs)    Types: Cigarettes    Start date: 8    Quit date: 2006    Years since quitting: 19.2   Smokeless tobacco: Never  Vaping Use   Vaping status: Never Used  Substance and Sexual Activity   Alcohol use: Yes    Alcohol/week: 14.0 standard drinks of alcohol    Types: 14 Glasses of wine per week     Comment: 2 glasses per day   Drug use: Never   Sexual activity: Not on file  Other Topics Concern   Not on file  Social History Narrative   Not on file   Social Drivers of Health   Financial Resource Strain: Not on file  Food Insecurity: Not on file  Transportation Needs: Not on file  Physical Activity: Not on file  Stress: Not on file  Social Connections: Not on file   Allergies  Allergen Reactions   Bactrim    Otezla [Apremilast] Hives   Penicillins    Family History  Problem Relation Age of Onset   Heart Problems Mother    Hypothyroidism Mother    High blood pressure Father    High Cholesterol Father    Diabetes Brother    Diabetes Maternal Grandmother    High blood pressure Maternal Uncle    Atrial fibrillation Maternal Uncle    Hypothyroidism Maternal Uncle    Heart Problems Maternal Aunt    Cancer Maternal Aunt    Hypothyroidism Maternal Aunt    Cancer Cousin    Breast cancer Cousin    Hypothyroidism Daughter    Hypothyroidism Son     Current Outpatient Medications (Endocrine & Metabolic):    levothyroxine (SYNTHROID) 125 MCG  tablet, Take 1 tablet (125 mcg total) by mouth daily before breakfast.   predniSONE (DELTASONE) 20 MG tablet, Take 2 tablets (40 mg total) by mouth daily. (Patient not taking: Reported on 05/25/2023)  Current Outpatient Medications (Cardiovascular):    rosuvastatin (CRESTOR) 10 MG tablet, Take 1 tablet (10 mg total) by mouth daily.  Current Outpatient Medications (Respiratory):    albuterol (VENTOLIN HFA) 108 (90 Base) MCG/ACT inhaler, Inhale 2 puffs into the lungs every 4 (four) hours as needed for wheezing or shortness of breath. (Patient not taking: Reported on 05/25/2023)   benzonatate (TESSALON) 100 MG capsule, Take 1 capsule (100 mg total) by mouth every 8 (eight) hours. (Patient not taking: Reported on 05/25/2023)   promethazine-dextromethorphan (PROMETHAZINE-DM) 6.25-15 MG/5ML syrup, Take 5 mLs by mouth at bedtime as needed.  (Patient not taking: Reported on 05/25/2023)  Current Outpatient Medications (Analgesics):    meloxicam (MOBIC) 15 MG tablet, Take 1 tablet (15 mg total) by mouth daily. (Patient not taking: Reported on 05/25/2023)   Current Outpatient Medications (Other):    doxycycline (VIBRA-TABS) 100 MG tablet, Take 1 tablet (100 mg total) by mouth 2 (two) times daily. (Patient not taking: Reported on 05/25/2023)   esomeprazole (NEXIUM) 20 MG capsule, Take 1 capsule (20 mg total) by mouth daily.   levofloxacin (LEVAQUIN) 500 MG tablet, Take 1 tablet (500 mg total) by mouth daily. (Patient not taking: Reported on 05/25/2023)   mometasone (ELOCON) 0.1 % cream, qd to bid 5 days a week to aa psoriasis prn flares   Multiple Vitamin (MULTIVITAMIN) tablet, Take 1 tablet by mouth daily.   sertraline (ZOLOFT) 100 MG tablet, Take 1 tablet (100 mg total) by mouth at bedtime.   Tapinarof (VTAMA) 1 % CREA, Apply to affected areas psoriasis once daily. (Patient not taking: Reported on 05/25/2023)   zolpidem (AMBIEN) 5 MG tablet, Take 1 - 2 tablets (5 - 10 mg total) by mouth at bedtime as needed.   Reviewed prior external information including notes and imaging from  primary care provider As well as notes that were available from care everywhere and other healthcare systems.  Past medical history, social, surgical and family history all reviewed in electronic medical record.  No pertanent information unless stated regarding to the chief complaint.   Review of Systems:  No headache, visual changes, nausea, vomiting, diarrhea, constipation, dizziness, abdominal pain, skin rash, fevers, chills, night sweats, weight loss, swollen lymph nodes, body aches, joint swelling, chest pain, shortness of breath, mood changes. POSITIVE muscle aches  Objective  There were no vitals taken for this visit.   General: No apparent distress alert and oriented x3 mood and affect normal, dressed appropriately.  HEENT: Pupils equal,  extraocular movements intact  Respiratory: Patient's speak in full sentences and does not appear short of breath  Cardiovascular: No lower extremity edema, non tender, no erythema      Impression and Recommendations:

## 2023-07-26 ENCOUNTER — Ambulatory Visit: Payer: Commercial Managed Care - PPO | Admitting: Family Medicine

## 2023-07-28 ENCOUNTER — Other Ambulatory Visit (HOSPITAL_COMMUNITY): Payer: Self-pay

## 2023-07-28 ENCOUNTER — Other Ambulatory Visit: Payer: Self-pay

## 2023-07-28 ENCOUNTER — Other Ambulatory Visit: Payer: Self-pay | Admitting: Family Medicine

## 2023-07-28 DIAGNOSIS — E782 Mixed hyperlipidemia: Secondary | ICD-10-CM

## 2023-07-28 MED ORDER — ROSUVASTATIN CALCIUM 10 MG PO TABS
10.0000 mg | ORAL_TABLET | Freq: Every day | ORAL | 3 refills | Status: AC
  Filled 2023-07-28: qty 90, 90d supply, fill #0
  Filled 2023-10-24: qty 90, 90d supply, fill #1
  Filled 2024-01-21: qty 90, 90d supply, fill #2
  Filled 2024-04-18: qty 90, 90d supply, fill #3

## 2023-07-28 MED ORDER — SERTRALINE HCL 100 MG PO TABS
100.0000 mg | ORAL_TABLET | Freq: Every day | ORAL | 3 refills | Status: AC
  Filled 2023-07-28: qty 90, 90d supply, fill #0
  Filled 2023-10-24: qty 90, 90d supply, fill #1
  Filled 2024-02-02: qty 90, 90d supply, fill #2
  Filled 2024-04-21: qty 90, 90d supply, fill #3

## 2023-08-08 ENCOUNTER — Other Ambulatory Visit (HOSPITAL_COMMUNITY): Payer: Self-pay

## 2023-08-15 ENCOUNTER — Encounter: Payer: Self-pay | Admitting: Family Medicine

## 2023-08-15 ENCOUNTER — Ambulatory Visit (INDEPENDENT_AMBULATORY_CARE_PROVIDER_SITE_OTHER): Admitting: Family Medicine

## 2023-08-15 VITALS — BP 135/79 | HR 70 | Temp 98.6°F | Ht 62.0 in | Wt 223.8 lb

## 2023-08-15 DIAGNOSIS — R739 Hyperglycemia, unspecified: Secondary | ICD-10-CM

## 2023-08-15 DIAGNOSIS — D751 Secondary polycythemia: Secondary | ICD-10-CM

## 2023-08-15 DIAGNOSIS — Z780 Asymptomatic menopausal state: Secondary | ICD-10-CM | POA: Diagnosis not present

## 2023-08-15 DIAGNOSIS — R600 Localized edema: Secondary | ICD-10-CM

## 2023-08-15 DIAGNOSIS — J432 Centrilobular emphysema: Secondary | ICD-10-CM | POA: Diagnosis not present

## 2023-08-15 DIAGNOSIS — E039 Hypothyroidism, unspecified: Secondary | ICD-10-CM

## 2023-08-15 DIAGNOSIS — J4530 Mild persistent asthma, uncomplicated: Secondary | ICD-10-CM | POA: Diagnosis not present

## 2023-08-15 DIAGNOSIS — F411 Generalized anxiety disorder: Secondary | ICD-10-CM

## 2023-08-15 DIAGNOSIS — E782 Mixed hyperlipidemia: Secondary | ICD-10-CM

## 2023-08-15 DIAGNOSIS — Z0001 Encounter for general adult medical examination with abnormal findings: Secondary | ICD-10-CM | POA: Diagnosis not present

## 2023-08-15 LAB — COMPREHENSIVE METABOLIC PANEL WITH GFR
ALT: 23 U/L (ref 0–35)
AST: 27 U/L (ref 0–37)
Albumin: 4.2 g/dL (ref 3.5–5.2)
Alkaline Phosphatase: 56 U/L (ref 39–117)
BUN: 11 mg/dL (ref 6–23)
CO2: 27 meq/L (ref 19–32)
Calcium: 9.1 mg/dL (ref 8.4–10.5)
Chloride: 105 meq/L (ref 96–112)
Creatinine, Ser: 1.02 mg/dL (ref 0.40–1.20)
GFR: 57.53 mL/min — ABNORMAL LOW (ref >60.00)
Glucose, Bld: 112 mg/dL — ABNORMAL HIGH (ref 70–99)
Potassium: 4.1 meq/L (ref 3.5–5.1)
Sodium: 141 meq/L (ref 135–145)
Total Bilirubin: 0.6 mg/dL (ref 0.2–1.2)
Total Protein: 6.6 g/dL (ref 6.0–8.3)

## 2023-08-15 LAB — LIPID PANEL
Cholesterol: 175 mg/dL (ref 0–200)
HDL: 62.1 mg/dL (ref >39.00)
LDL Cholesterol: 82 mg/dL (ref 0–99)
NonHDL: 112.99
Total CHOL/HDL Ratio: 3
Triglycerides: 154 mg/dL — ABNORMAL HIGH (ref 0.0–149.0)
VLDL: 30.8 mg/dL (ref 0.0–40.0)

## 2023-08-15 LAB — CBC WITH DIFFERENTIAL/PLATELET
Basophils Absolute: 0 10*3/uL (ref 0.0–0.1)
Basophils Relative: 0.6 % (ref 0.0–3.0)
Eosinophils Absolute: 0.1 10*3/uL (ref 0.0–0.7)
Eosinophils Relative: 2.3 % (ref 0.0–5.0)
HCT: 43.4 % (ref 36.0–46.0)
Hemoglobin: 14.6 g/dL (ref 12.0–15.0)
Lymphocytes Relative: 16.5 % (ref 12.0–46.0)
Lymphs Abs: 1 10*3/uL (ref 0.7–4.0)
MCHC: 33.6 g/dL (ref 30.0–36.0)
MCV: 97.4 fl (ref 78.0–100.0)
Monocytes Absolute: 0.6 10*3/uL (ref 0.1–1.0)
Monocytes Relative: 10.2 % (ref 3.0–12.0)
Neutro Abs: 4.3 10*3/uL (ref 1.4–7.7)
Neutrophils Relative %: 70.4 % (ref 43.0–77.0)
Platelets: 197 10*3/uL (ref 150.0–400.0)
RBC: 4.45 Mil/uL (ref 3.87–5.11)
RDW: 14.1 % (ref 11.5–15.5)
WBC: 6 10*3/uL (ref 4.0–10.5)

## 2023-08-15 MED ORDER — NALTREXONE-BUPROPION HCL ER 8-90 MG PO TB12
ORAL_TABLET | ORAL | 0 refills | Status: DC
  Filled 2023-08-15: qty 120, 47d supply, fill #0

## 2023-08-15 NOTE — Progress Notes (Signed)
 Subjective  Chief Complaint  Patient presents with   Annual Exam    Pt here for Annual Exam and is currently fasting    Hyperlipidemia   Hypothyroidism    HPI: Susan Burch is a 68 y.o. female who presents to [facility] [place] today for a Female Wellness Visit. She also has the concerns and/or needs as listed above in the chief complaint. These will be addressed in addition to the Health Maintenance Visit.   Wellness Visit: annual visit with health maintenance review and exam  HM: pap smears: discussed screening guidelines and recommendation to stop screen at age 73. Pt agrees. No h/o recent abnormals and screens have been current. Colonoscopy is current; last done 2023 by Dr. Tova Fresh. Due for osteoporosis screen. Imms up to date. Still full time L&D nurse.  Chronic disease f/u and/or acute problem visit: (deemed necessary to be done in addition to the wellness visit): Discussed the use of AI scribe software for clinical note transcription with the patient, who gave verbal consent to proceed.  History of Present Illness   Susan Burch is a 68 year old female who presents for an annual physical exam.  She experiences swelling in her feet, particularly after long shifts, which she manages with compression socks. The swelling improves after resting over the weekend and elevating her legs overnight. She attributes this to chronic venous insufficiency from prolonged standing during her twelve-hour shifts.  She is currently taking cholesterol medication and reports doing well on it. Crestor  10. She is also on sertraline  for mood, which is stable. Her thyroid  medication was adjusted to 125 mcg, and she is awaiting follow-up results to assess the effectiveness of this dosage. Sees endocrinology. I reviewed labs.  Lab Results  Component Value Date   TSH 10.29 (H) 07/05/2023    She has a history of mild osteopenia and has not had a bone density scan in six years. She had  cryotherapy for an abnormal Pap smear many years ago but has had normal follow-ups since then. She is postmenopausal and not experiencing any irregular bleeding.  She inquires about weight loss medications, mentioning previous use of Wegovy  and expressing interest in other options due to cost concerns. She has gained weight back and is exploring alternatives. She did well on wegovy . Exercise is limited. Diet is low fat.       Assessment  1. Encounter for well adult exam with abnormal findings   2. Hypothyroidism (acquired)   3. Mixed hyperlipidemia   4. Polycythemia, mild, evaluation by Dr Salomon Cree 2020   5. Centrilobular emphysema (HCC)   6. Mild persistent asthma without complication   7. GAD (generalized anxiety disorder)   8. Asymptomatic menopausal state   9. Bilateral lower extremity edema   10. Morbid obesity [facility])      Plan  Female Wellness Visit: Age appropriate Health Maintenance and Prevention measures were discussed with patient. Included topics are cancer screening recommendations, ways to keep healthy (see AVS) including dietary and exercise recommendations, regular eye and dental care, use of seat belts, and avoidance of moderate alcohol use and tobacco use. Dexa ordered for osteoporosis screen BMI: discussed patient's BMI and encouraged positive lifestyle modifications to help get to or maintain a target BMI. HM needs and immunizations were addressed and ordered. See below for orders. See HM and immunization section for updates. Routine labs and screening tests ordered including cmp, cbc and lipids where appropriate. Discussed recommendations regarding Vit D and  calcium  supplementation (see AVS)  Chronic disease management visit and/or acute problem visit: Assessment and Plan    Wellness Visit Routine wellness visit. No further cervical cancer screens advised. Addressed work environment and retirement plans. - Order blood work.  Chronic venous insufficiency Chronic  venous insufficiency with leg swelling. Compression stockings used. Discussed Lasix  for relief on workdays. Advised leg elevation and sodium reduction. - Consider Lasix  for symptomatic relief if needed. Pt defers for now.  - Advise leg elevation and compression stockings. - Advise reducing sodium intake.  Hypothyroidism Hypothyroidism managed with levothyroxine , recently adjusted to 125 mcg. Awaiting follow-up test results.  Hyperlipidemia Hyperlipidemia managed with medication. - Recheck fasting lipid panel and lfts -continue crestor  10  Osteopenia Mild osteopenia. Recommended bone density scan. - Order bone density scan.  GAD remains well controlled on sertraline .   Weight Management Discussed weight management options and postmenopausal weight gain. Considered medications like Contrave  and phentermine. Explained medication functions. Emphasized exercise and diet. - Prescribe Contrave  if covered by insurance.  - consider glp-1 but cost limiting at this poitn.       Follow up: 3 mo for weight check.  Orders Placed This Encounter  Procedures   DG Bone Density   CBC with Differential/Platelet   Comprehensive metabolic panel with GFR   Lipid panel   Meds ordered this encounter  Medications   Naltrexone -buPROPion  HCl ER 8-90 MG TB12    Sig: Start 1 tablet every morning for 7 days, then 1 tablet twice daily for 7 days, then 2 tablets every morning and one in the evening    Dispense:  120 tablet    Refill:  0      Body mass index is 40.93 kg/m. Wt Readings from Last 3 Encounters:  08/15/23 223 lb 12.8 oz (101.5 kg)  05/25/23 219 lb (99.3 kg)  05/23/23 220 lb (99.8 kg)     Patient Active Problem List   Diagnosis Date Noted Date Diagnosed   Centrilobular emphysema (HCC) 12/11/2019     Priority: High    Mild noted on CT for lung cancer screen 2021; former long term smoker.    Obesity (BMI 30-39.9) 06/12/2019     Priority: High   Mixed hyperlipidemia 02/20/2019      Priority: High    Crestor  5, increased to 10mg  07/2022    Situational mixed anxiety and depressive disorder 11/12/2018     Priority: High   Polycythemia, mild, evaluation by Dr Salomon Cree 2020 09/05/2018     Priority: High    Evaluation by Dr. Salomon Cree 2020.  -Discussed patient's most recent labs from 06/05/18, HGB at 16.2 and HCT at 47.8. Other blood counts normal. GFR slightly low at 53.31, other chemistries are normal. -Reviewed previous labs; earliest available HGB from 05/26/15 was at 15.6 with a HCT of 46.3. 09/21/16 HGB at 15.5. 05/30/17 HGB at 15.6. -Discussed that the patient's polycythemia is borderline, and after looking at the trend over the last 3.5 years, she has not had progression nor other blood count abnormalities. Do not suspect primary bone marrow disorder such as polycythemia vera. -Discussed that her mild polycythemia is likely secondary from hemo-concentration from diuretics vs lower oxygen levels due to asthma vs lower oxygen levels due to sleep apnea vs fluctuating thyroid  levels -Recommend pursuing sleep study with PCP to rule out sleep apnea, which could be a cause of mild polycythemia, and pt does endorse snoring 2024: pt declines sleep study. Hgb has improved as well    Bilateral lower extremity  edema 04/11/2018     Priority: High    Echocardiogram 05/2018: mild diastolic dysfunction; nl EF. Mild LVH, prn lasix  after long shifts at work    Sleep initiation dysfunction 09/27/2011     Priority: High   Hypothyroidism (acquired) 11/06/2006     Priority: High    Managed by Dr. Aldona Amel     Former smoker, 32 pack year history, quit 2006 11/12/2018     Priority: Medium    Episodic tension-type headache, not intractable 11/12/2018     Priority: Medium    Mild persistent asthma 09/21/2016     Priority: Medium     Has seen Dr. Almeda Jacobs in the past    Postmenopausal disorder 02/14/2013     Priority: Medium    GERD 11/06/2006     Priority: Medium    Allergic rhinitis 11/06/2006      Priority: Low   Morbid obesity (HCC) 08/15/2023    Arthritis of first metatarsophalangeal (MTP) joint of right foot 05/23/2023     Patient given injection May 23, 2023    Loss of transverse plantar arch of right foot 05/23/2023    GAD (generalized anxiety disorder) 07/27/2022    Greater trochanteric bursitis of both hips 11/28/2006     Qualifier: Diagnosis of  By: Ena Harries MD, Viktoria Gray Injection given January 06, 2021 Bilateral injections given January 12, 2023    Health Maintenance  Topic Date Due   DEXA SCAN  07/07/2017   COVID-19 Vaccine (4 - 2024-25 season) 08/31/2023 (Originally 12/25/2022)   INFLUENZA VACCINE  11/24/2023   MAMMOGRAM  07/11/2024   DTaP/Tdap/Td (3 - Td or Tdap) 07/23/2031   Colonoscopy  12/18/2031   Pneumonia Vaccine 19+ Years old  Completed   Hepatitis C Screening  Completed   Zoster Vaccines- Shingrix  Completed   HPV VACCINES  Aged Out   Meningococcal B Vaccine  Aged Out   Immunization History  Administered Date(s) Administered   Fluad Trivalent(High Dose 65+) 02/07/2023   Influenza, Quadrivalent, Recombinant, Inj, Pf 01/21/2019   Influenza-Unspecified 01/23/2014, 12/25/2014, 01/23/2018, 01/24/2020, 01/23/2021   PFIZER(Purple Top)SARS-COV-2 Vaccination 04/20/2019, 05/09/2019, 02/01/2020   PNEUMOCOCCAL CONJUGATE-20 03/29/2023   Pneumococcal Conjugate-13 11/28/2006   Pneumococcal Polysaccharide-23 02/14/2013   Td 07/03/2006   Tdap 07/22/2021   Zoster Recombinant(Shingrix) 01/21/2019, 06/19/2020   We updated and reviewed the patient's past history in detail and it is documented below. Allergies: Patient is allergic to bactrim, otezla  [apremilast ], and penicillins. Past Medical History Patient  has a past medical history of Centrilobular emphysema (HCC) (12/11/2019), GERD without esophagitis, HLD (hyperlipidemia), Hypothyroidism, Mixed hyperlipidemia (02/20/2019), Psoriasis, and Sleep initiation dysfunction (09/27/2011). Past Surgical  History Patient  has a past surgical history that includes Breast excisional biopsy (Right, 1990); Myomectomy (1993); Cholecystectomy (1998); and Cesarean section (1994, 1996). Family History: Patient family history includes Atrial fibrillation in her maternal uncle; Breast cancer in her cousin; Cancer in her cousin and maternal aunt; Diabetes in her brother and maternal grandmother; Heart Problems in her maternal aunt and mother; High Cholesterol in her father; High blood pressure in her father and maternal uncle; Hypothyroidism in her daughter, maternal aunt, maternal uncle, mother, and son. Social History:  Patient  reports that she quit smoking about 19 years ago. Her smoking use included cigarettes. She started smoking about 51 years ago. She has a 32 pack-year smoking history. She has never used smokeless tobacco. She reports current alcohol use of about 14.0 standard drinks of alcohol per week. She reports that she does not use  drugs.  Review of Systems: Constitutional: negative for fever or malaise Ophthalmic: negative for photophobia, double vision or loss of vision Cardiovascular: negative for chest pain, dyspnea on exertion, or new LE swelling Respiratory: negative for SOB or persistent cough Gastrointestinal: negative for abdominal pain, change in bowel habits or melena Genitourinary: negative for dysuria or gross hematuria, no abnormal uterine bleeding or disharge Musculoskeletal: negative for new gait disturbance or muscular weakness Integumentary: negative for new or persistent rashes, no breast lumps Neurological: negative for TIA or stroke symptoms Psychiatric: negative for SI or delusions Allergic/Immunologic: negative for hives  Patient Care Team    Relationship Specialty Notifications Start End  Luevenia Saha, MD PCP - General Family Medicine  02/20/19   Isidro Margo, DO Consulting Physician Family Medicine  04/11/18   Emilie Harden, MD Consulting Physician  Endocrinology  04/11/18   Zara Heymann, MD Referring Physician Allergy  04/11/18   Ivery Marking, MD Consulting Physician Obstetrics and Gynecology  04/11/18   Emilie Harden, MD Consulting Physician Endocrinology  07/27/22   Tami Falcon, MD Consulting Physician Gastroenterology  08/15/23     Objective  Vitals: BP 135/79   Pulse 70   Temp 98.6 F (37 C)   Ht 5\' 2"  (1.575 m)   Wt 223 lb 12.8 oz (101.5 kg)   SpO2 94%   BMI 40.93 kg/m  General:  Well developed, well nourished, no acute distress  Psych:  Alert and orientedx3,normal mood and affect HEENT:  Normocephalic, atraumatic, non-icteric sclera,  supple neck without adenopathy, mass or thyromegaly Cardiovascular:  Normal S1, S2, RRR without gallop, rub or murmur, no edema Respiratory:  Good breath sounds bilaterally, CTAB with normal respiratory effort Gastrointestinal: normal bowel sounds, soft, non-tender, no noted masses. No HSM MSK: extremities without edema, joints without erythema or swelling, skin changes due to sun damage present on forearms Neurologic:    Mental status is normal.  Gross motor and sensory exams are normal.  No tremor  Commons side effects, risks, benefits, and alternatives for medications and treatment plan prescribed today were discussed, and the patient expressed understanding of the given instructions. Patient is instructed to call or message via MyChart if he/she has any questions or concerns regarding our treatment plan. No barriers to understanding were identified. We discussed Red Flag symptoms and signs in detail. Patient expressed understanding regarding what to do in case of urgent or emergency type symptoms.  Medication list was reconciled, printed and provided to the patient in AVS. Patient instructions and summary information was reviewed with the patient as documented in the AVS. This note was prepared with assistance of Dragon voice recognition software. Occasional wrong-word or  sound-a-like substitutions may have occurred due to the inherent limitations of voice recognition software

## 2023-08-15 NOTE — Patient Instructions (Signed)
 Please return in 3 months for weight recheck.   I will release your lab results to you on your MyChart account with further instructions. You may see the results before I do, but when I review them I will send you a message with my report or have my assistant call you if things need to be discussed. Please reply to my message with any questions. Thank you!   If you have any questions or concerns, please don't hesitate to send me a message via MyChart or call the office at [phone]. Thank you for visiting with us  today! It's our pleasure caring for you.   VISIT SUMMARY:  During your follow-up visit, we discussed your concerns about your triglyceride levels, rheumatoid arthritis flare-ups, vitamin D levels, and your progress with weight loss and dietary changes. We reviewed your current medications and made some recommendations to help manage your conditions more effectively.  YOUR PLAN:  -RHEUMATOID ARTHRITIS: Rheumatoid arthritis is an autoimmune condition that causes joint inflammation and pain. Your recent flare-ups may be due to missed medication doses and increased physical activity. Please ensure you take your medication as prescribed to help manage your symptoms.  -PREDIABETES: Prediabetes means your blood sugar levels are higher than normal but not high enough to be classified as diabetes. Your A1c level is 5.9. To manage and potentially reverse this condition, continue focusing on diet and exercise, limiting processed foods, and increasing whole foods.  -HYPERTRIGLYCERIDEMIA: Hypertriglyceridemia means you have high levels of triglycerides in your blood, which can increase your risk of heart disease. Your level is 239 mg/dL. We discussed making dietary changes and considering an over-the-counter fish oil supplement to help lower your triglycerides.  -VITAMIN D DEFICIENCY: Vitamin D deficiency means you have lower than normal levels of vitamin D, which is important for bone health and  immune function. Your recent levels are in the 40s. Start taking a daily vitamin D supplement of 1000 IU to help maintain adequate levels.  Susan Burch VISIT: Your blood pressure is well controlled, and your screenings are up to date. You have lost five pounds by making dietary changes, which is excellent progress. Continue with your current blood pressure management and lifestyle changes to support your overall health and weight management.  INSTRUCTIONS:  Please follow up with your previous doctor regarding your insurance coverage concerns. Ensure you adhere to your rheumatoid arthritis medication regimen to prevent flare-ups. Consider adding an over-the-counter fish oil supplement to your diet to help manage your triglyceride levels. Start taking a daily vitamin D supplement of 1000 IU. Continue with your dietary changes and exercise to manage your prediabetes and support your weight loss efforts.

## 2023-08-16 ENCOUNTER — Other Ambulatory Visit: Payer: Self-pay

## 2023-08-16 ENCOUNTER — Telehealth: Payer: Self-pay

## 2023-08-16 ENCOUNTER — Encounter: Payer: Self-pay | Admitting: Family Medicine

## 2023-08-16 ENCOUNTER — Other Ambulatory Visit (HOSPITAL_COMMUNITY): Payer: Self-pay

## 2023-08-16 DIAGNOSIS — R739 Hyperglycemia, unspecified: Secondary | ICD-10-CM

## 2023-08-16 NOTE — Progress Notes (Signed)
 Please have lab add on an A1c if possible for hyperglycemia.   Dear Susan Burch, Your lab results look good overall. However, your fasting sugar is elevated. It is 112, and should be less than 100. I will try to add on a HgbA1c to see where you are. This could be in the prediabetic range. Eating better is important; I'm hopeful the weight loss medication will assist.  Everything else is in range.  Sincerely, Dr. Jonelle Neri

## 2023-08-16 NOTE — Telephone Encounter (Signed)
 Ozempic Susan Burch is approved exclusively as an adjunct to diet and exercise to improve glycemic control in adults with type 2 diabetes mellitus. A review of patient's medical chart reveals no documented diagnosis of type 2 diabetes or an A1C indicative of diabetes. Therefore, they do not currently meet the criteria for prior authorization of this medication. If clinically appropriate, alternative options such as Saxenda, Zepbound, or Wegovy  may be considered for this patient.   Per test claim, Wegovy  0.25mg /0.19ml is not covered by the plan and is a benefit exclusion.

## 2023-08-16 NOTE — Telephone Encounter (Signed)
 Pharmacy Patient Advocate Encounter   Received notification from CoverMyMeds that prior authorization for CONTRAVE  8-9MG  ER TABS is required/requested.   Insurance verification completed.   The patient is insured through [employer] [facility] [employer] .   Per test claim: PA required; PA submitted to above mentioned insurance via CoverMyMeds Key/confirmation #/[id number] Status is pending

## 2023-08-16 NOTE — Telephone Encounter (Unsigned)
 Copied from CRM [id number]. Topic: Referral - Prior Authorization Question >> Aug 16, 2023 10:26 AM Danae Duncans wrote: Reason for CRM: Pt advise a prior authorization is needed for Ozempic  and the pharmacy will be sending a form via fax, wanted to make sure it gets completed and filled out.

## 2023-08-17 ENCOUNTER — Other Ambulatory Visit: Payer: Self-pay

## 2023-08-17 LAB — HEMOGLOBIN A1C: Hgb A1c MFr Bld: 5.8 % (ref 4.6–6.5)

## 2023-08-21 ENCOUNTER — Other Ambulatory Visit (HOSPITAL_COMMUNITY): Payer: Self-pay

## 2023-08-21 NOTE — Telephone Encounter (Signed)
 Pharmacy Patient Advocate Encounter  Received notification from [facility] that Prior Authorization for CONTRAVE  8-90MG  ER TABS has been APPROVED from 08/18/2023 to 12/13/2023. Ran test claim, Copay is $27.97. This test claim was processed through [facility]- copay amounts may vary at other pharmacies due to pharmacy/plan contracts, or as the patient moves through the different stages of their insurance plan.   PA #/Case ID/Reference #: [id number]

## 2023-08-22 ENCOUNTER — Encounter: Payer: Commercial Managed Care - PPO | Admitting: Family Medicine

## 2023-08-22 ENCOUNTER — Other Ambulatory Visit (HOSPITAL_COMMUNITY): Payer: Self-pay

## 2023-08-23 ENCOUNTER — Other Ambulatory Visit: Payer: Self-pay

## 2023-08-23 ENCOUNTER — Other Ambulatory Visit (HOSPITAL_COMMUNITY): Payer: Self-pay

## 2023-08-24 ENCOUNTER — Encounter: Payer: Self-pay | Admitting: Family Medicine

## 2023-08-24 ENCOUNTER — Other Ambulatory Visit (HOSPITAL_COMMUNITY): Payer: Self-pay

## 2023-08-24 MED ORDER — POTASSIUM CHLORIDE CRYS ER 20 MEQ PO TBCR
20.0000 meq | EXTENDED_RELEASE_TABLET | Freq: Every day | ORAL | 3 refills | Status: AC | PRN
Start: 2023-08-24 — End: ?
  Filled 2023-08-24: qty 90, 90d supply, fill #0

## 2023-08-24 MED ORDER — FUROSEMIDE 20 MG PO TABS
20.0000 mg | ORAL_TABLET | Freq: Every day | ORAL | 3 refills | Status: AC | PRN
  Filled 2023-08-24: qty 90, 90d supply, fill #0

## 2023-08-25 ENCOUNTER — Other Ambulatory Visit (HOSPITAL_COMMUNITY): Payer: Self-pay

## 2023-08-25 ENCOUNTER — Other Ambulatory Visit: Payer: Self-pay

## 2023-08-31 ENCOUNTER — Encounter: Payer: Self-pay | Admitting: Family Medicine

## 2023-09-07 ENCOUNTER — Other Ambulatory Visit (HOSPITAL_COMMUNITY): Payer: Self-pay

## 2023-09-11 ENCOUNTER — Ambulatory Visit (HOSPITAL_BASED_OUTPATIENT_CLINIC_OR_DEPARTMENT_OTHER)
Admission: RE | Admit: 2023-09-11 | Discharge: 2023-09-11 | Disposition: A | Discharge status: Home / Self Care | Payer: Self-pay | Source: Ambulatory Visit | Attending: Family Medicine | Admitting: Family Medicine

## 2023-09-11 ENCOUNTER — Other Ambulatory Visit (HOSPITAL_BASED_OUTPATIENT_CLINIC_OR_DEPARTMENT_OTHER): Payer: Self-pay

## 2023-09-11 ENCOUNTER — Encounter (HOSPITAL_BASED_OUTPATIENT_CLINIC_OR_DEPARTMENT_OTHER): Payer: Self-pay

## 2023-09-11 VITALS — BP 114/72 | HR 86 | Temp 98.3°F | Resp 18

## 2023-09-11 DIAGNOSIS — J209 Acute bronchitis, unspecified: Secondary | ICD-10-CM | POA: Diagnosis not present

## 2023-09-11 MED ORDER — BENZONATATE 100 MG PO CAPS
100.0000 mg | ORAL_CAPSULE | Freq: Three times a day (TID) | ORAL | 0 refills | Status: DC | PRN
  Filled 2023-09-11: qty 21, 4d supply, fill #0

## 2023-09-11 MED ORDER — AZITHROMYCIN 250 MG PO TABS
250.0000 mg | ORAL_TABLET | Freq: Every day | ORAL | 0 refills | Status: DC
  Filled 2023-09-11: qty 6, 5d supply, fill #0

## 2023-09-11 MED ORDER — PREDNISONE 20 MG PO TABS
40.0000 mg | ORAL_TABLET | Freq: Every day | ORAL | 0 refills | Status: AC
  Filled 2023-09-11: qty 10, 5d supply, fill #0

## 2023-09-11 MED ORDER — CEFDINIR 300 MG PO CAPS
300.0000 mg | ORAL_CAPSULE | Freq: Two times a day (BID) | ORAL | 0 refills | Status: AC
Start: 2023-09-11 — End: 2023-09-18
  Filled 2023-09-11: qty 14, 7d supply, fill #0

## 2023-09-11 NOTE — ED Triage Notes (Signed)
 Pt c/o productive cough that started on Friday. She has been using albuterol  inhaler, but it doesn't seem to be getting better.

## 2023-09-11 NOTE — Discharge Instructions (Signed)
 Treating you for bronchitis.  Medication as prescribed.  You can use your inhaler as needed for cough, wheezing or shortness of breath.  Also recommend Mucinex  over-the-counter.  Follow-up as needed

## 2023-09-11 NOTE — Progress Notes (Deleted)
 [name] [address] [place] [address] Phone: [phone] Subjective:    I'm seeing this patient by the request  of:  Luevenia Saha, MD  CC:   [id number]  05/23/2023 New problem. Patient given injection and tolerated the procedure well. Discussed icing regimen and home exercises, proper shoes, I do believe patient will do well with the conservative therapy. Do believe that patient needs to have the support of the metatarsal with the breakdown of the transverse arch that will need to be monitored. Will follow-up again in 8 weeks otherwise   Updated 09/13/2023 Susan Burch is a 68 y.o. female coming in with complaint of foot pain       Past Medical History:  Diagnosis Date   Centrilobular emphysema (HCC) 12/11/2019   Mild noted on CT for lung cancer screen 2021; former long term smoker.   GERD without esophagitis    HLD (hyperlipidemia)    Hypothyroidism    Mixed hyperlipidemia 02/20/2019   Psoriasis    Sleep initiation dysfunction 09/27/2011   Past Surgical History:  Procedure Laterality Date   BREAST EXCISIONAL BIOPSY Right 1990   CESAREAN SECTION  1994, 1996   CHOLECYSTECTOMY  1998   MYOMECTOMY  1993   Social History   Socioeconomic History   Marital status: Married    Spouse name: Not on file   Number of children: Not on file   Years of education: Not on file   Highest education level: Not on file  Occupational History   Occupation: Charity fundraiser    Employer: [employer]  Tobacco Use   Smoking status: Former    Current packs/day: 0.00    Average packs/day: 1 pack/day for 32.0 years (32.0 ttl pk-yrs)    Types: Cigarettes    Start date: 49    Quit date: 2006    Years since quitting: 19.3   Smokeless tobacco: Never  Vaping Use   Vaping status: Never Used  Substance and Sexual Activity   Alcohol use: Yes    Alcohol/week: 14.0 standard drinks of alcohol    Types: 14 Glasses of wine per week    Comment: 2 glasses per  day   Drug use: Never   Sexual activity: Not on file  Other Topics Concern   Not on file  Social History Narrative   Not on file   Social Drivers of Health   Financial Resource Strain: Not on file  Food Insecurity: Not on file  Transportation Needs: Not on file  Physical Activity: Not on file  Stress: Not on file  Social Connections: Not on file   Allergies  Allergen Reactions   Bactrim    Otezla  [Apremilast ] Hives   Penicillins    Family History  Problem Relation Age of Onset   Heart Problems Mother    Hypothyroidism Mother    High blood pressure Father    High Cholesterol Father    Diabetes Brother    Diabetes Maternal Grandmother    High blood pressure Maternal Uncle    Atrial fibrillation Maternal Uncle    Hypothyroidism Maternal Uncle    Heart Problems Maternal Aunt    Cancer Maternal Aunt    Hypothyroidism Maternal Aunt    Cancer Cousin    Breast cancer Cousin    Hypothyroidism Daughter    Hypothyroidism Son     Current Outpatient Medications (Endocrine & Metabolic):    levothyroxine  (SYNTHROID ) 125 MCG tablet, Take 1 tablet (125 mcg total) by mouth  daily before breakfast.   predniSONE  (DELTASONE ) 20 MG tablet, Take 2 tablets (40 mg total) by mouth daily with breakfast for 5 days.  Current Outpatient Medications (Cardiovascular):    furosemide  (LASIX ) 20 MG tablet, Take 1 tablet (20 mg total) by mouth daily as needed for edema. Take with potassium   rosuvastatin  (CRESTOR ) 10 MG tablet, Take 1 tablet (10 mg total) by mouth daily.  Current Outpatient Medications (Respiratory):    benzonatate  (TESSALON ) 100 MG capsule, Take 1-2 capsules (100-200 mg total) by mouth every 8 (eight) hours as needed.  Current Outpatient Medications (Analgesics):    meloxicam  (MOBIC ) 15 MG tablet, Take 1 tablet (15 mg total) by mouth daily.   Current Outpatient Medications (Other):    cefdinir  (OMNICEF ) 300 MG capsule, Take 1 capsule (300 mg total) by mouth 2 (two) times daily  for 7 days.   esomeprazole  (NEXIUM ) 20 MG capsule, Take 1 capsule (20 mg total) by mouth daily.   mometasone  (ELOCON ) 0.1 % cream, qd to bid 5 days a week to aa psoriasis prn flares   Multiple Vitamin (MULTIVITAMIN) tablet, Take 1 tablet by mouth daily.   Naltrexone -buPROPion  HCl ER 8-90 MG TB12, Start 1 tablet every morning for 7 days, then 1 tablet twice daily for 7 days, then 2 tablets every morning and one in the evening   potassium chloride  SA (KLOR-CON  M) 20 MEQ tablet, Take 1 tablet (20 mEq total) by mouth daily as needed (leg swelling). Take with furosemide    sertraline  (ZOLOFT ) 100 MG tablet, Take 1 tablet (100 mg total) by mouth at bedtime.   zolpidem  (AMBIEN ) 5 MG tablet, Take 1 - 2 tablets (5 - 10 mg total) by mouth at bedtime as needed.   Reviewed prior external information including notes and imaging from  primary care provider As well as notes that were available from care everywhere and other healthcare systems.  Past medical history, social, surgical and family history all reviewed in electronic medical record.  No pertanent information unless stated regarding to the chief complaint.   Review of Systems:  No headache, visual changes, nausea, vomiting, diarrhea, constipation, dizziness, abdominal pain, skin rash, fevers, chills, night sweats, weight loss, swollen lymph nodes, body aches, joint swelling, chest pain, shortness of breath, mood changes. POSITIVE muscle aches  Objective  There were no vitals taken for this visit.   General: No apparent distress alert and oriented x3 mood and affect normal, dressed appropriately.  HEENT: Pupils equal, extraocular movements intact  Respiratory: Patient's speak in full sentences and does not appear short of breath  Cardiovascular: No lower extremity edema, non tender, no erythema      Impression and Recommendations:

## 2023-09-11 NOTE — ED Provider Notes (Signed)
 [facility]    CSN: [id number] Arrival date & time: 09/11/23  1249      History   Chief Complaint Chief Complaint  Patient presents with   Cough    HPI Susan Burch is a 68 y.o. female.   Patient is a 68 year old female presents today for approximately 4 days of cough, wheezing, shortness of breath, congestion.  Feels like the problem is getting worse.  She has had some sweats.  He has been using cough syrup and her albuterol  inhaler with minimal relief.  History of asthma.   Cough   Past Medical History:  Diagnosis Date   Centrilobular emphysema (HCC) 12/11/2019   Mild noted on CT for lung cancer screen 2021; former long term smoker.   GERD without esophagitis    HLD (hyperlipidemia)    Hypothyroidism    Mixed hyperlipidemia 02/20/2019   Psoriasis    Sleep initiation dysfunction 09/27/2011    Patient Active Problem List   Diagnosis Date Noted   Morbid obesity (HCC) 08/15/2023   Arthritis of first metatarsophalangeal (MTP) joint of right foot 05/23/2023   Loss of transverse plantar arch of right foot 05/23/2023   GAD (generalized anxiety disorder) 07/27/2022   Centrilobular emphysema (HCC) 12/11/2019   Obesity (BMI 30-39.9) 06/12/2019   Mixed hyperlipidemia 02/20/2019   Former smoker, 32 pack year history, quit 2006 11/12/2018   Situational mixed anxiety and depressive disorder 11/12/2018   Episodic tension-type headache, not intractable 11/12/2018   Polycythemia, mild, evaluation by Dr Salomon Cree 2020 09/05/2018   Bilateral lower extremity edema 04/11/2018   Mild persistent asthma 09/21/2016   Postmenopausal disorder 02/14/2013   Sleep initiation dysfunction 09/27/2011   Greater trochanteric bursitis of both hips 11/28/2006   Hypothyroidism (acquired) 11/06/2006   Allergic rhinitis 11/06/2006   GERD 11/06/2006    Past Surgical History:  Procedure Laterality Date   BREAST EXCISIONAL BIOPSY Right 1990   CESAREAN SECTION  1994, 1996   CHOLECYSTECTOMY   1998   MYOMECTOMY  1993    OB History   No obstetric history on file.      Home Medications    Prior to Admission medications   Medication Sig Start Date End Date Taking? Authorizing Provider  benzonatate  (TESSALON ) 100 MG capsule Take 1-2 capsules (100-200 mg total) by mouth every 8 (eight) hours as needed. 1 09/11/23  Yes Tamee Battin A, FNP  cefdinir  (OMNICEF ) 300 MG capsule Take 1 capsule (300 mg total) by mouth 2 (two) times daily for 7 days. 09/11/23 09/18/23 Yes Duey Liller A, FNP  levothyroxine  (SYNTHROID ) 125 MCG tablet Take 1 tablet (125 mcg total) by mouth daily before breakfast. 07/10/23  Yes Emilie Harden, MD  predniSONE  (DELTASONE ) 20 MG tablet Take 2 tablets (40 mg total) by mouth daily with breakfast for 5 days. 09/11/23 09/16/23 Yes Dutchess Crosland A, FNP  rosuvastatin  (CRESTOR ) 10 MG tablet Take 1 tablet (10 mg total) by mouth daily. 07/28/23  Yes Luevenia Saha, MD  sertraline  (ZOLOFT ) 100 MG tablet Take 1 tablet (100 mg total) by mouth at bedtime. 07/28/23 07/27/24 Yes Luevenia Saha, MD  esomeprazole  (NEXIUM ) 20 MG capsule Take 1 capsule (20 mg total) by mouth daily. 09/28/22   Luevenia Saha, MD  furosemide  (LASIX ) 20 MG tablet Take 1 tablet (20 mg total) by mouth daily as needed for edema. Take with potassium 08/24/23   Luevenia Saha, MD  meloxicam  (MOBIC ) 15 MG tablet Take 1 tablet (15 mg total) by mouth daily. 04/05/23  Ulysees Gander, DO  mometasone  (ELOCON ) 0.1 % cream qd to bid 5 days a week to aa psoriasis prn flares 01/02/23   Stewart, Tara, MD  Multiple Vitamin (MULTIVITAMIN) tablet Take 1 tablet by mouth daily.    [provider]  Naltrexone -buPROPion  HCl ER 8-90 MG TB12 Start 1 tablet every morning for 7 days, then 1 tablet twice daily for 7 days, then 2 tablets every morning and one in the evening 08/15/23   Luevenia Saha, MD  potassium chloride  SA (KLOR-CON  M) 20 MEQ tablet Take 1 tablet (20 mEq total) by mouth daily as needed (leg swelling). Take with  furosemide  08/24/23   Luevenia Saha, MD  zolpidem  (AMBIEN ) 5 MG tablet Take 1 - 2 tablets (5 - 10 mg total) by mouth at bedtime as needed. 04/06/23   Luevenia Saha, MD    Family History Family History  Problem Relation Age of Onset   Heart Problems Mother    Hypothyroidism Mother    High blood pressure Father    High Cholesterol Father    Diabetes Brother    Diabetes Maternal Grandmother    High blood pressure Maternal Uncle    Atrial fibrillation Maternal Uncle    Hypothyroidism Maternal Uncle    Heart Problems Maternal Aunt    Cancer Maternal Aunt    Hypothyroidism Maternal Aunt    Cancer Cousin    Breast cancer Cousin    Hypothyroidism Daughter    Hypothyroidism Son     Social History Social History   Tobacco Use   Smoking status: Former    Current packs/day: 0.00    Average packs/day: 1 pack/day for 32.0 years (32.0 ttl pk-yrs)    Types: Cigarettes    Start date: 10    Quit date: 2006    Years since quitting: 19.3   Smokeless tobacco: Never  Vaping Use   Vaping status: Never Used  Substance Use Topics   Alcohol use: Yes    Alcohol/week: 14.0 standard drinks of alcohol    Types: 14 Glasses of wine per week    Comment: 2 glasses per day   Drug use: Never     Allergies   Bactrim, Otezla  [apremilast ], and Penicillins   Review of Systems Review of Systems  Respiratory:  Positive for cough.    See HPI  Physical Exam Triage Vital Signs ED Triage Vitals  Encounter Vitals Group     BP 09/11/23 1309 114/72     Systolic BP Percentile --      Diastolic BP Percentile --      Pulse Rate 09/11/23 1309 86     Resp 09/11/23 1309 18     Temp 09/11/23 1309 98.3 F (36.8 C)     Temp Source 09/11/23 1309 Oral     SpO2 09/11/23 1309 94 %     Weight --      Height --      Head Circumference --      Peak Flow --      Pain Score 09/11/23 1308 0     Pain Loc --      Pain Education --      Exclude from Growth Chart --    No data found.  Updated Vital  Signs BP 114/72 (BP Location: Right Arm)   Pulse 86   Temp 98.3 F (36.8 C) (Oral)   Resp 18   SpO2 94%   Visual Acuity Right Eye Distance:   Left Eye Distance:  Bilateral Distance:    Right Eye Near:   Left Eye Near:    Bilateral Near:     Physical Exam Constitutional:      General: She is not in acute distress.    Appearance: Normal appearance. She is ill-appearing. She is not toxic-appearing or diaphoretic.  HENT:     Head: Normocephalic and atraumatic.     Right Ear: Tympanic membrane and ear canal normal.     Left Ear: Tympanic membrane and ear canal normal.     Nose: No congestion or rhinorrhea.     Mouth/Throat:     Pharynx: Oropharynx is clear.  Eyes:     Conjunctiva/sclera: Conjunctivae normal.  Cardiovascular:     Rate and Rhythm: Normal rate and regular rhythm.     Pulses: Normal pulses.     Heart sounds: Normal heart sounds.  Pulmonary:     Effort: Pulmonary effort is normal.     Breath sounds: Wheezing and rhonchi present.  Skin:    General: Skin is warm.  Neurological:     Mental Status: She is alert.  Psychiatric:        Mood and Affect: Mood normal.      UC Treatments / Results  Labs (all labs ordered are listed, but only abnormal results are displayed) Labs Reviewed - No data to display  EKG   Radiology No results found.  Procedures Procedures (including critical care time)  Medications Ordered in UC Medications - No data to display  Initial Impression / Assessment and Plan / UC Course  I have reviewed the triage vital signs and the nursing notes.  Pertinent labs & imaging results that were available during my care of the patient were reviewed by me and considered in my medical decision making (see chart for details).     Acute bronchitis-treating with cefdinir , prednisone  and Tessalon  Perles for cough as needed.  Also recommended to get some Mucinex  over-the-counter to use this for congestion to thin mucus. Albuterol  inhaler  as needed for cough, wheezing or shortness of breath. Follow up as needed.  Final Clinical Impressions(s) / UC Diagnoses   Final diagnoses:  Acute bronchitis, unspecified organism   Discharge Instructions      Treating you for bronchitis.  Medication as prescribed.  You can use your inhaler as needed for cough, wheezing or shortness of breath.  Also recommend Mucinex  over-the-counter.  Follow-up as needed  ED Prescriptions     Medication Sig Dispense Auth. Provider   predniSONE  (DELTASONE ) 20 MG tablet Take 2 tablets (40 mg total) by mouth daily with breakfast for 5 days. 10 tablet Esteven Overfelt A, FNP   azithromycin  (ZITHROMAX ) 250 MG tablet  (Status: Discontinued) Take first 2 tablets together on day 1, then 1 every day until finished. 6 tablet Edyth Glomb A, FNP   benzonatate  (TESSALON ) 100 MG capsule Take 1-2 capsules (100-200 mg total) by mouth every 8 (eight) hours as needed. 1 21 capsule Jennifer Payes A, FNP   cefdinir  (OMNICEF ) 300 MG capsule Take 1 capsule (300 mg total) by mouth 2 (two) times daily for 7 days. 14 capsule Jerri Morale A, FNP      PDMP not reviewed this encounter.   Landa Pine, FNP 09/11/23 1338

## 2023-09-13 ENCOUNTER — Ambulatory Visit: Admitting: Family Medicine

## 2023-09-22 ENCOUNTER — Other Ambulatory Visit (HOSPITAL_COMMUNITY): Payer: Self-pay

## 2023-09-26 ENCOUNTER — Other Ambulatory Visit (HOSPITAL_BASED_OUTPATIENT_CLINIC_OR_DEPARTMENT_OTHER): Payer: Self-pay

## 2023-09-26 ENCOUNTER — Ambulatory Visit (HOSPITAL_BASED_OUTPATIENT_CLINIC_OR_DEPARTMENT_OTHER): Payer: Self-pay

## 2023-09-26 ENCOUNTER — Ambulatory Visit: Admitting: Internal Medicine

## 2023-09-26 ENCOUNTER — Encounter: Payer: Self-pay | Admitting: Internal Medicine

## 2023-09-26 ENCOUNTER — Other Ambulatory Visit: Payer: Self-pay

## 2023-09-26 ENCOUNTER — Other Ambulatory Visit (HOSPITAL_COMMUNITY): Payer: Self-pay

## 2023-09-26 VITALS — BP 128/80 | HR 77 | Ht 62.0 in | Wt 222.0 lb

## 2023-09-26 DIAGNOSIS — J4521 Mild intermittent asthma with (acute) exacerbation: Secondary | ICD-10-CM

## 2023-09-26 MED ORDER — METHYLPREDNISOLONE ACETATE 80 MG/ML IJ SUSP
0.8000 mg | Freq: Once | INTRAMUSCULAR | Status: AC
  Administered 2023-09-26: 0.8 mg via INTRAMUSCULAR

## 2023-09-26 MED ORDER — IPRATROPIUM-ALBUTEROL 0.5-2.5 (3) MG/3ML IN SOLN
3.0000 mL | Freq: Once | RESPIRATORY_TRACT | Status: AC
  Administered 2023-09-26: 3 mL via RESPIRATORY_TRACT

## 2023-09-26 MED ORDER — BREZTRI AEROSPHERE 160-9-4.8 MCG/ACT IN AERO
2.0000 | INHALATION_SPRAY | Freq: Two times a day (BID) | RESPIRATORY_TRACT | 5 refills | Status: DC
  Filled 2023-09-26 (×2): qty 10.7, 30d supply, fill #0
  Filled 2023-10-21: qty 10.7, 30d supply, fill #1
  Filled 2023-11-20: qty 10.7, 30d supply, fill #2
  Filled 2023-12-20: qty 10.7, 30d supply, fill #3
  Filled 2024-01-16: qty 10.7, 30d supply, fill #4
  Filled 2024-02-13: qty 10.7, 30d supply, fill #5

## 2023-09-26 MED ORDER — FLUTICASONE PROPIONATE 50 MCG/ACT NA SUSP
1.0000 | Freq: Every day | NASAL | 2 refills | Status: AC
  Filled 2023-09-26 (×2): qty 16, 60d supply, fill #0
  Filled 2023-11-06 – 2023-11-13 (×2): qty 16, 60d supply, fill #1
  Filled 2024-05-22: qty 16, 90d supply, fill #2

## 2023-09-26 MED ORDER — PREDNISONE 20 MG PO TABS
ORAL_TABLET | ORAL | 0 refills | Status: AC
  Filled 2023-09-26 (×2): qty 15, 12d supply, fill #0

## 2023-09-26 NOTE — Progress Notes (Addendum)
 Susan Burch    [id number]    1955-11-05  Primary Care Physician:[name], Rudene Corti, MD Date of Appointment: 09/26/2023 Established Patient Visit  Chief complaint:   Chief Complaint  Patient presents with   Acute Visit     HPI: Susan Burch is a 68 y.o. woman with history of Asthma COPD overlap syndrome. Patient of Dr. Hortense Lyons last seen in 2023. Quit smoking 2006  Interval Updates: Here for acute visit. Seen in urgent care 5/19 with 4 days of cough, chest, congestion, wheezing. Treated with prednisone , omnicef , benzonatate  and albuterol  prn.  She completed the prednisone  and Omnicef  and is still having some coughing and nasal congestion.  She is also having some chest pain with coughing and deep inspiration, but it has not been severe enough to try anything over-the-counter like ibuprofen or acetaminophen.  She is taking albuterol  inhaler about once daily which does seem to help her symptoms of shortness of breath and chest tightness.  She thought she was supposed to stop the Breztri  inhaler at her last appointment with Dr. Alva and so she has not taken that in 2 years.  Overall over the past 2 years she has not been sick at all with her asthma and has not needed any prednisone  or antibiotics in the last 2 years.   I have reviewed the patient's family social and past medical history and updated as appropriate.   Past Medical History:  Diagnosis Date   Centrilobular emphysema (HCC) 12/11/2019   Mild noted on CT for lung cancer screen 2021; former long term smoker.   GERD without esophagitis    HLD (hyperlipidemia)    Hypothyroidism    Mixed hyperlipidemia 02/20/2019   Psoriasis    Sleep initiation dysfunction 09/27/2011    Past Surgical History:  Procedure Laterality Date   BREAST EXCISIONAL BIOPSY Right 1990   CESAREAN SECTION  1994, 1996   CHOLECYSTECTOMY  1998   MYOMECTOMY  1993    Family History  Problem Relation Age of Onset   Heart Problems Mother     Hypothyroidism Mother    High blood pressure Father    High Cholesterol Father    Diabetes Brother    Diabetes Maternal Grandmother    High blood pressure Maternal Uncle    Atrial fibrillation Maternal Uncle    Hypothyroidism Maternal Uncle    Heart Problems Maternal Aunt    Cancer Maternal Aunt    Hypothyroidism Maternal Aunt    Cancer Cousin    Breast cancer Cousin    Hypothyroidism Daughter    Hypothyroidism Son     Social History   Occupational History   Occupation: Teacher, adult education: [employer]  Tobacco Use   Smoking status: Former    Current packs/day: 0.00    Average packs/day: 1 pack/day for 32.0 years (32.0 ttl pk-yrs)    Types: Cigarettes    Start date: 45    Quit date: 2006    Years since quitting: 19.4   Smokeless tobacco: Never  Vaping Use   Vaping status: Never Used  Substance and Sexual Activity   Alcohol use: Yes    Alcohol/week: 14.0 standard drinks of alcohol    Types: 14 Glasses of wine per week    Comment: 2 glasses per day   Drug use: Never   Sexual activity: Not on file     Physical Exam: Blood pressure 128/80, pulse 77, height 5\' 2"  (1.575 m), weight 222  lb (100.7 kg), SpO2 92%.  Gen:      No acute distress ENT:  no nasal polyps, mucus membranes moist Lungs:    No increased respiratory effort, symmetric chest wall excursion, clear to auscultation bilaterally, no wheezes or crackles CV:         Regular rate and rhythm; no murmurs, rubs, or gallops.  No pedal edema   Data Reviewed: Imaging: I have personally reviewed the   PFTs:     Latest Ref Rng & Units 06/15/2017   10:54 AM  PFT Results  FVC-Pre L 3.28   FVC-Predicted Pre % 102   FVC-Post L 3.21   FVC-Predicted Post % 100   Pre FEV1/FVC % % 84   Post FEV1/FCV % % 82   FEV1-Pre L 2.76   FEV1-Predicted Pre % 111   FEV1-Post L 2.63   DLCO uncorrected ml/min/mmHg 21.40   DLCO UNC% % 93   DLCO corrected ml/min/mmHg 20.15   DLCO COR %Predicted % 87   DLVA Predicted % 87    TLC L 5.50   TLC % Predicted % 112   RV % Predicted % 102    I have personally reviewed the patient's PFTs and normal pulmonary function.   Labs: Lab Results  Component Value Date   NA 141 08/15/2023   K 4.1 08/15/2023   CO2 27 08/15/2023   GLUCOSE 112 (H) 08/15/2023   BUN 11 08/15/2023   CREATININE 1.02 08/15/2023   CALCIUM  9.1 08/15/2023   GFR 57.53 (L) 08/15/2023   GFRNONAA 51 (L) 09/26/2018   Lab Results  Component Value Date   WBC 6.0 08/15/2023   HGB 14.6 08/15/2023   HCT 43.4 08/15/2023   MCV 97.4 08/15/2023   PLT 197.0 08/15/2023    Immunization status: Immunization History  Administered Date(s) Administered   Fluad Trivalent(High Dose 65+) 02/07/2023   Influenza, Quadrivalent, Recombinant, Inj, Pf 01/21/2019   Influenza-Unspecified 01/23/2014, 12/25/2014, 01/23/2018, 01/24/2020, 01/23/2021   PFIZER(Purple Top)SARS-COV-2 Vaccination 04/20/2019, 05/09/2019, 02/01/2020   PNEUMOCOCCAL CONJUGATE-20 03/29/2023   Pneumococcal Conjugate-13 11/28/2006   Pneumococcal Polysaccharide-23 02/14/2013   Td 07/03/2006   Tdap 07/22/2021   Zoster Recombinant(Shingrix) 01/21/2019, 06/19/2020    External Records Personally Reviewed: pulmonary, urgent care  Assessment:  Asthma with acute exacerbation History of tobacco use disorder, quit 2006  Plan/Recommendations: Sorry to hear you have not been feeling well.  You have already completed the course of steroids and antibiotics and are still having some coughing most likely related to the postnasal drainage from your recent infection.  If you are not feeling better with the below in the next week please call us  and let us  know.  Oxygen saturations were 92% on room air and didn't drop below 90% with ambulation   We gave you a breathing treatment in the office today and I have extended your prednisone  course with a taper.  We also gave you a steroid shot in the office today.  I recommend continuing the Allegra which might  help dry up some of the drainage.  I would also start taking Flonase nasal spray 1 spray each side your nose twice daily to help with nasal congestion and drainage which is causing your cough.  You can take ibuprofen over-the-counter for any chest wall pain related to the coughing.  Resume Breztri  inhaler 2 puffs twice daily, gargle after use.  I sent this to your pharmacy.  You can take the albuterol  inhaler up to 4 times a day as needed for chest  pain shortness of breath coughing wheezing.  Regarding Breztri : because you do not have daily symptoms, using this inhaler on an as needed basis may be sufficient.      Return to Care: Return in about 1 year (around 09/25/2024).   Louie Rover, MD Pulmonary and Critical Care Medicine [facility] [phone]

## 2023-09-26 NOTE — Addendum Note (Signed)
 Addended by: Santia Labate on: 09/26/2023 12:09 PM   Modules accepted: Orders

## 2023-09-26 NOTE — Patient Instructions (Addendum)
 It was a pleasure to see you today!  Please schedule follow up with Dr Villa Greaser in 1 year.  Please call [phone] if you haven't heard from us  a month before, and always call us  sooner if issues or concerns arise. You can also send us  a message through MyChart, but but aware that this is not to be used for urgent issues and it may take up to 5-7 days to receive a reply. Please be aware that you will likely be able to view your results before I have a chance to respond to them. Please give us  5 business days to respond to any non-urgent results.   Sorry to hear you have not been feeling well.  You have already completed the course of steroids and antibiotics and are still having some coughing most likely related to the postnasal drainage from your recent infection.  If you are not feeling better with the below in the next week please call us  and let us  know.  We gave you a breathing treatment in the office today and I have extended your prednisone  course with a taper.  We also gave you a steroid shot in the office today.  I recommend continuing the Allegra which might help dry up some of the drainage.  I would also start taking Flonase nasal spray 1 spray each side your nose twice daily to help with nasal congestion and drainage which is causing your cough.  You can take ibuprofen over-the-counter for any chest wall pain related to the coughing.  Resume Breztri  inhaler 2 puffs twice daily, gargle after use.  I sent this to your pharmacy.  You can take the albuterol  inhaler up to 4 times a day as needed for chest pain shortness of breath coughing wheezing.  Regarding Breztri : because you do not have daily symptoms, using this inhaler on an as needed basis may be sufficient.   I recommend starting to use the inhaler daily as prescribed if you have symptoms of asthma such as chest tightness, wheezing, coughing, shortness of breath. You should also start using it if you have exposure to a sick contact,  worsening allergies, or any other trigger for your asthma. I recommend you keep using it even after your respiratory symptoms resolve for 3-4 days. The goal of this therapy is to prevent your symptoms from becoming a flare severe enough to require steroids like prednisone .   Please call our office if using this inhaler on an as needed basis is not sufficient. You might need to be seen sooner than our scheduled follow up.   Flonase - 1 spray on each side of your nose twice a day for first week, then 1 spray on each side.   Instructions for use: If you also use a saline nasal spray or rinse, use that first. Position the head with the chin slightly tucked. Use the right hand to spray into the left nostril and the right hand to spray into the left nostril.   Point the bottle away from the septum of your nose (cartilage that divides the two sides of your nose).  Hold the nostril closed on the opposite side from where you will spray Spray once and gently sniff to pull the medicine into the higher parts of your nose.  Don't sniff too hard as the medicine will drain down the back of your throat instead. Repeat with a second spray on the same side if prescribed. Repeat on the other side of your nose.

## 2023-09-26 NOTE — Addendum Note (Signed)
 Addended by: KIJANIA-SWARINGEN, Angello Chien R on: 09/26/2023 01:04 PM   Modules accepted: Orders

## 2023-09-27 ENCOUNTER — Ambulatory Visit (HOSPITAL_BASED_OUTPATIENT_CLINIC_OR_DEPARTMENT_OTHER)
Admit: 2023-09-27 | Discharge: 2023-09-27 | Disposition: A | Discharge status: Home / Self Care | Attending: Family Medicine | Admitting: Radiology

## 2023-09-27 ENCOUNTER — Ambulatory Visit (HOSPITAL_BASED_OUTPATIENT_CLINIC_OR_DEPARTMENT_OTHER): Payer: Self-pay | Admitting: Family Medicine

## 2023-09-27 ENCOUNTER — Ambulatory Visit (HOSPITAL_BASED_OUTPATIENT_CLINIC_OR_DEPARTMENT_OTHER)
Admission: RE | Admit: 2023-09-27 | Discharge: 2023-09-27 | Disposition: A | Discharge status: Home / Self Care | Payer: Self-pay | Source: Ambulatory Visit | Attending: Family Medicine | Admitting: Family Medicine

## 2023-09-27 ENCOUNTER — Other Ambulatory Visit (HOSPITAL_COMMUNITY): Payer: Self-pay

## 2023-09-27 ENCOUNTER — Encounter (HOSPITAL_BASED_OUTPATIENT_CLINIC_OR_DEPARTMENT_OTHER): Payer: Self-pay

## 2023-09-27 VITALS — BP 148/82 | HR 69 | Temp 98.1°F | Resp 18

## 2023-09-27 DIAGNOSIS — J4521 Mild intermittent asthma with (acute) exacerbation: Secondary | ICD-10-CM | POA: Diagnosis not present

## 2023-09-27 DIAGNOSIS — R051 Acute cough: Secondary | ICD-10-CM

## 2023-09-27 DIAGNOSIS — R059 Cough, unspecified: Secondary | ICD-10-CM | POA: Diagnosis not present

## 2023-09-27 NOTE — ED Triage Notes (Signed)
 Pt seen a pulmonary MD yesterday. Her 02 sats were 90-93%. She received a steroid shot and a breathing treatment. She did get temporary relief but they would not prescribe anymore antibiotics. She would like a chest xray to r/o pneumonia.She has been sick for almost 3 weeks now.

## 2023-09-27 NOTE — Discharge Instructions (Addendum)
 Acute asthma exacerbation with cough: Chest x-ray is negative for pneumonia nor consolidation.  Continue current medications including Breztri  inhaler, Prednisone  and Albuterol  inhaler.  Additional antibiotics not needed.  Follow-up here or with Pulmonology, as needed.

## 2023-09-27 NOTE — ED Provider Notes (Signed)
 [facility]    CSN: [id number] Arrival date & time: 09/27/23  1136      History   Chief Complaint Chief Complaint  Patient presents with   Cough    HPI Susan [facility] is a 68 y.o. female.   Note from Pulmonologist from 09/26/23: "Here for acute visit. Seen in urgent care 5/19 with 4 days of cough, chest, congestion, wheezing. Treated with prednisone , omnicef , benzonatate  and albuterol  prn.  She completed the prednisone  and Omnicef  and is still having some coughing and nasal congestion.  She is also having some chest pain with coughing and deep inspiration, but it has not been severe enough to try anything over-the-counter like ibuprofen or acetaminophen.  She is taking albuterol  inhaler about once daily which does seem to help her symptoms of shortness of breath and chest tightness.  She thought she was supposed to stop the Breztri  inhaler at her last appointment with Dr. Alva and so she has not taken that in 2 years.  Overall over the past 2 years she has not been sick at all with her asthma and has not needed any prednisone  or antibiotics in the last 2 years."  At her Pulmonology visit yesterday, she received an additional Prednisone  taper and a breathing treatment. She was also instructed to restart her Brextri inhaler, which she had stopped by accident.  She did get temporary relief but they would not prescribe anymore antibiotics. She would like a chest xray to r/o pneumonia.She has been sick since [place] May of 2025.   Cough Associated symptoms: shortness of breath and wheezing   Associated symptoms: no chest pain, no chills, no ear pain, no fever, no rash and no sore throat     Past Medical History:  Diagnosis Date   Centrilobular emphysema (HCC) 12/11/2019   Mild noted on CT for lung cancer screen 2021; former long term smoker.   GERD without esophagitis    HLD (hyperlipidemia)    Hypothyroidism    Mixed hyperlipidemia 02/20/2019   Psoriasis    Sleep initiation  dysfunction 09/27/2011    Patient Active Problem List   Diagnosis Date Noted   Morbid obesity (HCC) 08/15/2023   Arthritis of first metatarsophalangeal (MTP) joint of right foot 05/23/2023   Loss of transverse plantar arch of right foot 05/23/2023   GAD (generalized anxiety disorder) 07/27/2022   Centrilobular emphysema (HCC) 12/11/2019   Obesity (BMI 30-39.9) 06/12/2019   Mixed hyperlipidemia 02/20/2019   Former smoker, 32 pack year history, quit 2006 11/12/2018   Situational mixed anxiety and depressive disorder 11/12/2018   Episodic tension-type headache, not intractable 11/12/2018   Polycythemia, mild, evaluation by Dr Salomon Cree 2020 09/05/2018   Bilateral lower extremity edema 04/11/2018   Mild persistent asthma 09/21/2016   Postmenopausal disorder 02/14/2013   Sleep initiation dysfunction 09/27/2011   Greater trochanteric bursitis of both hips 11/28/2006   Hypothyroidism (acquired) 11/06/2006   Allergic rhinitis 11/06/2006   GERD 11/06/2006    Past Surgical History:  Procedure Laterality Date   BREAST EXCISIONAL BIOPSY Right 1990   CESAREAN SECTION  1994, 1996   CHOLECYSTECTOMY  1998   MYOMECTOMY  1993    OB History   No obstetric history on file.      Home Medications    Prior to Admission medications   Medication Sig Start Date End Date Taking? Authorizing Provider  fluticasone (FLONASE) 50 MCG/ACT nasal spray Place 1 spray into both nostrils daily. 09/26/23  Yes Aleck Hurdle, MD  levothyroxine  (  SYNTHROID ) 125 MCG tablet Take 1 tablet (125 mcg total) by mouth daily before breakfast. 07/10/23  Yes Emilie Harden, MD  Naltrexone -buPROPion  HCl ER 8-90 MG TB12 Start 1 tablet every morning for 7 days, then 1 tablet twice daily for 7 days, then 2 tablets every morning and one in the evening 08/15/23  Yes Luevenia Saha, MD  predniSONE  (DELTASONE ) 20 MG tablet Take 2 tablets (40 mg total) by mouth daily with breakfast for 3 days, THEN 1.5 tablets (30 mg total) daily with  breakfast for 3 days, THEN 1 tablet (20 mg total) daily with breakfast for 3 days, THEN 0.5 tablets (10 mg total) daily with breakfast for 3 days. 09/26/23 10/08/23 Yes Aleck Hurdle, MD  rosuvastatin  (CRESTOR ) 10 MG tablet Take 1 tablet (10 mg total) by mouth daily. 07/28/23  Yes Luevenia Saha, MD  sertraline  (ZOLOFT ) 100 MG tablet Take 1 tablet (100 mg total) by mouth at bedtime. 07/28/23 07/27/24 Yes Luevenia Saha, MD  benzonatate  (TESSALON ) 100 MG capsule Take 1-2 capsules (100-200 mg total) by mouth every 8 (eight) hours as needed. 09/11/23   Jerri Morale A, FNP  budesonide -glycopyrrolate -formoterol  (BREZTRI  AEROSPHERE) 160-9-4.8 MCG/ACT AERO inhaler Inhale 2 puffs into the lungs in the morning and at bedtime. 09/26/23   Desai, Nikita S, MD  esomeprazole  (NEXIUM ) 20 MG capsule Take 1 capsule (20 mg total) by mouth daily. 09/28/22   Luevenia Saha, MD  furosemide  (LASIX ) 20 MG tablet Take 1 tablet (20 mg total) by mouth daily as needed for edema. Take with potassium 08/24/23   Luevenia Saha, MD  meloxicam  (MOBIC ) 15 MG tablet Take 1 tablet (15 mg total) by mouth daily. 04/05/23   Ulysees Gander, DO  mometasone  (ELOCON ) 0.1 % cream qd to bid 5 days a week to aa psoriasis prn flares 01/02/23   Stewart, Tara, MD  Multiple Vitamin (MULTIVITAMIN) tablet Take 1 tablet by mouth daily.    [provider]  potassium chloride  SA (KLOR-CON  M) 20 MEQ tablet Take 1 tablet (20 mEq total) by mouth daily as needed (leg swelling). Take with furosemide  08/24/23   Luevenia Saha, MD  zolpidem  (AMBIEN ) 5 MG tablet Take 1 - 2 tablets (5 - 10 mg total) by mouth at bedtime as needed. 04/06/23   Luevenia Saha, MD    Family History Family History  Problem Relation Age of Onset   Heart Problems Mother    Hypothyroidism Mother    High blood pressure Father    High Cholesterol Father    Diabetes Brother    Diabetes Maternal Grandmother    High blood pressure Maternal Uncle    Atrial fibrillation Maternal Uncle     Hypothyroidism Maternal Uncle    Heart Problems Maternal Aunt    Cancer Maternal Aunt    Hypothyroidism Maternal Aunt    Cancer Cousin    Breast cancer Cousin    Hypothyroidism Daughter    Hypothyroidism Son     Social History Social History   Tobacco Use   Smoking status: Former    Current packs/day: 0.00    Average packs/day: 1 pack/day for 32.0 years (32.0 ttl pk-yrs)    Types: Cigarettes    Start date: 59    Quit date: 2006    Years since quitting: 19.4   Smokeless tobacco: Never  Vaping Use   Vaping status: Never Used  Substance Use Topics   Alcohol use: Yes    Alcohol/week: 14.0 standard drinks of alcohol  Types: 14 Glasses of wine per week    Comment: 2 glasses per day   Drug use: Never     Allergies   Bactrim, Otezla  [apremilast ], and Penicillins   Review of Systems Review of Systems  Constitutional:  Negative for chills and fever.  HENT:  Positive for congestion. Negative for ear pain and sore throat.   Eyes:  Negative for pain and visual disturbance.  Respiratory:  Positive for cough, shortness of breath and wheezing.   Cardiovascular:  Negative for chest pain and palpitations.  Gastrointestinal:  Negative for abdominal pain, constipation, diarrhea, nausea and vomiting.  Genitourinary:  Negative for dysuria and hematuria.  Musculoskeletal:  Negative for arthralgias and back pain.  Skin:  Negative for color change and rash.  Neurological:  Negative for seizures and syncope.  All other systems reviewed and are negative.    Physical Exam Triage Vital Signs ED Triage Vitals  Encounter Vitals Group     BP 09/27/23 1221 (!) 148/82     Systolic BP Percentile --      Diastolic BP Percentile --      Pulse Rate 09/27/23 1221 69     Resp 09/27/23 1221 18     Temp 09/27/23 1221 98.1 F (36.7 C)     Temp Source 09/27/23 1221 Oral     SpO2 09/27/23 1221 95 %     Weight --      Height --      Head Circumference --      Peak Flow --      Pain  Score 09/27/23 1220 4     Pain Loc --      Pain Education --      Exclude from Growth Chart --    No data found.  Updated Vital Signs BP (!) 148/82 (BP Location: Right Arm)   Pulse 69   Temp 98.1 F (36.7 C) (Oral)   Resp 18   SpO2 95%   Visual Acuity Right Eye Distance:   Left Eye Distance:   Bilateral Distance:    Right Eye Near:   Left Eye Near:    Bilateral Near:     Physical Exam Vitals and nursing note reviewed.  Constitutional:      General: She is not in acute distress.    Appearance: She is well-developed. She is not ill-appearing or toxic-appearing.  HENT:     Head: Normocephalic and atraumatic.     Right Ear: Hearing, tympanic membrane, ear canal and external ear normal.     Left Ear: Hearing, tympanic membrane, ear canal and external ear normal.     Nose: No congestion or rhinorrhea.     Right Sinus: No maxillary sinus tenderness or frontal sinus tenderness.     Left Sinus: No maxillary sinus tenderness or frontal sinus tenderness.     Mouth/Throat:     Lips: Pink.     Mouth: Mucous membranes are moist.     Pharynx: Uvula midline. No oropharyngeal exudate or posterior oropharyngeal erythema.     Tonsils: No tonsillar exudate.  Eyes:     Conjunctiva/sclera: Conjunctivae normal.     Pupils: Pupils are equal, round, and reactive to light.  Cardiovascular:     Rate and Rhythm: Normal rate and regular rhythm.     Heart sounds: S1 normal and S2 normal. No murmur heard. Pulmonary:     Effort: Pulmonary effort is normal. No respiratory distress.     Breath sounds: Normal breath sounds. No decreased breath  sounds, wheezing, rhonchi or rales.     Comments: Lungs are clear today.  Oxygen saturation is 95%.  No wheezing on exam. Abdominal:     General: Bowel sounds are normal.     Palpations: Abdomen is soft.     Tenderness: There is no abdominal tenderness.  Musculoskeletal:        General: No swelling.     Cervical back: Neck supple.  Lymphadenopathy:      Head:     Right side of head: No submental, submandibular, tonsillar, preauricular or posterior auricular adenopathy.     Left side of head: No submental, submandibular, tonsillar, preauricular or posterior auricular adenopathy.     Cervical: No cervical adenopathy.     Right cervical: No superficial cervical adenopathy.    Left cervical: No superficial cervical adenopathy.  Skin:    General: Skin is warm and dry.     Capillary Refill: Capillary refill takes less than 2 seconds.     Findings: No rash.  Neurological:     Mental Status: She is alert and oriented to person, place, and time.  Psychiatric:        Mood and Affect: Mood normal.      UC Treatments / Results  Labs (all labs ordered are listed, but only abnormal results are displayed) Labs Reviewed - No data to display  EKG   Radiology DG Chest 2 View Result Date: 09/27/2023 CLINICAL DATA:  Cough. EXAM: CHEST - 2 VIEW COMPARISON:  Chest radiograph dated 05/30/2017. FINDINGS: The heart size and mediastinal contours are within normal limits. Both lungs are clear. The visualized skeletal structures are unremarkable. IMPRESSION: No active cardiopulmonary disease. Electronically Signed   By: Angus Bark M.D.   On: 09/27/2023 13:29    Procedures Procedures (including critical care time)  Medications Ordered in UC Medications - No data to display  Initial Impression / Assessment and Plan / UC Course  I have reviewed the triage vital signs and the nursing notes.  Pertinent labs & imaging results that were available during my care of the patient were reviewed by me and considered in my medical decision making (see chart for details).  Plan of Care: Acute asthma exacerbation with cough: Chest x-ray appears negative and no consolidation noted.  Continue prednisone , albuterol  inhaler, Breztri  inhaler.  Follow-up if symptoms do not improve, worsen or new symptoms occur.  Follow-up with pulmonology as planned.  I reviewed  the plan of care with the patient and/or the patient's guardian.  The patient and/or guardian had time to ask questions and acknowledged that the questions were answered.  I provided instruction on symptoms or reasons to return here or to go to an ER, if symptoms/condition did not improve, worsened or if new symptoms occurred.  Final Clinical Impressions(s) / UC Diagnoses   Final diagnoses:  Acute cough  Mild intermittent asthma with acute exacerbation     Discharge Instructions      Acute asthma exacerbation with cough: Chest x-ray is negative for pneumonia nor consolidation.  Continue current medications including Breztri  inhaler, Prednisone  and Albuterol  inhaler.  Additional antibiotics not needed.  Follow-up here or with Pulmonology, as needed.   ED Prescriptions   None    PDMP not reviewed this encounter.   Guss Legacy, FNP 09/27/23 [phone]

## 2023-09-27 NOTE — Progress Notes (Signed)
 Chest x-ray is negative or normal.  No pneumonia.  Patient was updated during her office visit.

## 2023-09-28 ENCOUNTER — Other Ambulatory Visit: Payer: Self-pay

## 2023-09-28 ENCOUNTER — Other Ambulatory Visit (HOSPITAL_COMMUNITY): Payer: Self-pay

## 2023-10-02 ENCOUNTER — Ambulatory Visit: Payer: Self-pay | Admitting: Internal Medicine

## 2023-10-02 NOTE — Telephone Encounter (Signed)
 Ok to schedule for an OV with any available provider

## 2023-10-02 NOTE — Telephone Encounter (Addendum)
 Spoke with patient regarding prior message . Patient stated she was seen on 09/26/2023 with Dr.Desai and was given medication and is still having a productive cough for 3 weeks some chest pain from coughing ,SOB and white mucus. Patient was scheduled with Katie on 10/03/2023.Patient's voice was understandstanding.Nothing else further needed.

## 2023-10-02 NOTE — Telephone Encounter (Signed)
 FYI Only or Action Required?: Action required by provider  Patient is followed in Pulmonology for asthma, last seen on 09/26/2023 by Aleck Hurdle, MD. Called Nurse Triage reporting Cough. Symptoms began several weeks ago. Interventions attempted: OTC medications: Motrin, Tylenol, Prescription medications: antibiotics, steroids, Rescue inhaler, Maintenance inhaler, and Increased fluids/rest. Symptoms are: unchanged.  Triage Disposition: Go to ED Now (Notify PCP)  Patient/caregiver understands and will follow disposition?: No, refuses disposition  Copied from CRM [id number]. Topic: Clinical - Red Word Triage >> Oct 02, 2023  8:25 AM Hilton Lucky wrote: Red Word that prompted transfer to Nurse Triage: Patient is requesting to be seen, continuing chest pain, coughing with production, etc. Was seen Friday by Dr. Dione Franks for asthma exacerbation, states not getting any better.   Provider note states for patient to c/b if patient is not feeling better in the next week from visit date or if directed inhaler use is not sufficient. Reason for Disposition  Chest pain  (Exception: MILD central chest pain, present only when coughing.)  Answer Assessment - Initial Assessment Questions 1. ONSET: "When did the cough begin?"      Cough started almost four weeks ago 2. SEVERITY: "How bad is the cough today?"      6-7 out of 10 3. SPUTUM: "Describe the color of your sputum" (none, dry cough; clear, white, yellow, green)     white 4. HEMOPTYSIS: "Are you coughing up any blood?" If so ask: "How much?" (flecks, streaks, tablespoons, etc.)     no 5. DIFFICULTY BREATHING: "Are you having difficulty breathing?" If Yes, ask: "How bad is it?" (e.g., mild, moderate, severe)    - MILD: No SOB at rest, mild SOB with walking, speaks normally in sentences, can lie down, no retractions, pulse < 100.    - MODERATE: SOB at rest, SOB with minimal exertion and prefers to sit, cannot lie down flat, speaks in phrases, mild retractions,  audible wheezing, pulse 100-120.    - SEVERE: Very SOB at rest, speaks in single words, struggling to breathe, sitting hunched forward, retractions, pulse > 120      Mild-moderate with movement 6. FEVER: "Do you have a fever?" If Yes, ask: "What is your temperature, how was it measured, and when did it start?"     no 7. CARDIAC HISTORY: "Do you have any history of heart disease?" (e.g., heart attack, congestive heart failure)      no 8. LUNG HISTORY: "Do you have any history of lung disease?"  (e.g., pulmonary embolus, asthma, emphysema)     asthma 9. PE RISK FACTORS: "Do you have a history of blood clots?" (or: recent major surgery, recent prolonged travel, bedridden)     no 10. OTHER SYMPTOMS: "Do you have any other symptoms?" (e.g., runny nose, wheezing, chest pain)       Chest pain with coughing,wheezing at times.  12. TRAVEL: "Have you traveled out of the country in the last month?" (e.g., travel history, exposures)       no  Patient was seen at [facility] on 09/27/2023 after she was seen by pulmonology on 09/26/2023. Patient reports chest pain with coughing.Chest pain 6-7 out of 10-located between shoulder blades and front of chest. Patient endorses that the pain can worsen depending on how she moves. Patient reports that she was crying out in pain yesterday while she was working due to pain and soreness in her chest.  Pulse oximeter reading done while on the phone-95% on Room Air. Patient feels like none  of the medication she has been on has helped at all. Patient does have an appointment with PCP tomorrow but would prefer to see pulmonary.  Protocols used: Cough - Acute Productive-A-AH

## 2023-10-03 ENCOUNTER — Other Ambulatory Visit: Payer: Self-pay

## 2023-10-03 ENCOUNTER — Encounter: Payer: Self-pay | Admitting: Nurse Practitioner

## 2023-10-03 ENCOUNTER — Other Ambulatory Visit (HOSPITAL_COMMUNITY): Payer: Self-pay

## 2023-10-03 ENCOUNTER — Ambulatory Visit (HOSPITAL_BASED_OUTPATIENT_CLINIC_OR_DEPARTMENT_OTHER)
Admission: RE | Admit: 2023-10-03 | Discharge: 2023-10-03 | Disposition: A | Discharge status: Home / Self Care | Source: Ambulatory Visit | Attending: Nurse Practitioner | Admitting: Radiology

## 2023-10-03 ENCOUNTER — Ambulatory Visit: Admitting: Family

## 2023-10-03 ENCOUNTER — Ambulatory Visit: Admitting: Nurse Practitioner

## 2023-10-03 VITALS — BP 112/68 | HR 68 | Temp 98.4°F | Ht 62.0 in | Wt 221.6 lb

## 2023-10-03 DIAGNOSIS — J9811 Atelectasis: Secondary | ICD-10-CM | POA: Diagnosis not present

## 2023-10-03 DIAGNOSIS — H6593 Unspecified nonsuppurative otitis media, bilateral: Secondary | ICD-10-CM | POA: Diagnosis not present

## 2023-10-03 DIAGNOSIS — J329 Chronic sinusitis, unspecified: Secondary | ICD-10-CM | POA: Diagnosis not present

## 2023-10-03 DIAGNOSIS — J40 Bronchitis, not specified as acute or chronic: Secondary | ICD-10-CM

## 2023-10-03 DIAGNOSIS — R0609 Other forms of dyspnea: Secondary | ICD-10-CM

## 2023-10-03 DIAGNOSIS — S2231XA Fracture of one rib, right side, initial encounter for closed fracture: Secondary | ICD-10-CM | POA: Diagnosis not present

## 2023-10-03 DIAGNOSIS — I700 Atherosclerosis of aorta: Secondary | ICD-10-CM | POA: Diagnosis not present

## 2023-10-03 LAB — BASIC METABOLIC PANEL WITH GFR
BUN/Creatinine Ratio: 15 (calc) (ref 6–22)
BUN: 16 mg/dL (ref 7–25)
CO2: 25 mmol/L (ref 20–32)
Calcium: 8.9 mg/dL (ref 8.6–10.4)
Chloride: 104 mmol/L (ref 98–110)
Creat: 1.08 mg/dL — ABNORMAL HIGH (ref 0.50–1.05)
Glucose, Bld: 113 mg/dL — ABNORMAL HIGH (ref 65–99)
Potassium: 4.4 mmol/L (ref 3.5–5.3)
Sodium: 138 mmol/L (ref 135–146)
eGFR: 57 mL/min/{1.73_m2} — ABNORMAL LOW (ref >=60)

## 2023-10-03 LAB — NITRIC OXIDE: Nitric Oxide: <5

## 2023-10-03 MED ORDER — HYDROCODONE BIT-HOMATROP MBR 5-1.5 MG/5ML PO SOLN
5.0000 mL | Freq: Four times a day (QID) | ORAL | 0 refills | Status: DC | PRN
  Filled 2023-10-03 (×2): qty 180, 9d supply, fill #0

## 2023-10-03 MED ORDER — IOHEXOL 350 MG/ML SOLN
100.0000 mL | Freq: Once | INTRAVENOUS | Status: AC | PRN
  Administered 2023-10-03: 80 mL via INTRAVENOUS

## 2023-10-03 MED ORDER — IOHEXOL 300 MG/ML  SOLN: 100.0000 mL | Freq: Once | INTRAMUSCULAR | Status: DC | PRN

## 2023-10-03 NOTE — Patient Instructions (Signed)
 Continue Breztri  2 puffs Twice daily. Brush tongue and rinse mouth afterwards Continue Albuterol  inhaler 2 puffs every 6 hours as needed for shortness of breath or wheezing. Notify if symptoms persist despite rescue inhaler/neb use.   Use saline nasal rinses twice daily with bottled distilled water. Follow with flonase  nasal spray 2 sprays each nostril daily Continue over the counter mucinex  Twice daily  Get sudafed or generic pseudoephedrine and take twice daily for the next 3-5 days  Hycodan cough syrup 5 mL every 6 hours as needed for cough. May cause drowsiness. Do not drive after taking   Complete prednisone  course   CTA chest to rule out blood clot and/or pneumonia given unresolved symptoms  Labs today  Upper airway cough syndrome: Suppress your cough to allow your larynx (voice box) to heal.  Limit talking for the next few days. Avoid throat clearing. Work on cough suppression with the above recommended suppressants.  Use sugar free hard candies or non-menthol cough drops during this time to soothe your throat.  Warm tea with honey and lemon.    Follow up in 2 week with Dr. Villa Greaser or Gina Lagos. If symptoms do not improve or worsen, please contact office for sooner follow up or seek emergency care.

## 2023-10-03 NOTE — Progress Notes (Unsigned)
 @Patient  ID: Susan Burch, female    DOB: 11-28-1955, 68 y.o.   MRN: [id number]  Chief Complaint  Patient presents with   Acute Visit    C/o cough x 4 wks., yellow today, occass. Wheeze, post. Nasal drip, denies fever.Hurts between shoulder blades with cough.    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 She is a patient of Dr. Hortense Lyons and last seen in office 09/26/2023. Past medical history significant for GERD, allergic rhinitis, hypothyroid, anxiety, depression, obesity, HLD.   TEST/EVENTS:   10/03/2023: Today - acute Discussed the use of AI scribe software for clinical note transcription with the patient, who gave verbal consent to proceed.  History of Present Illness   Susan Burch is a 68 year old female with asthma who presents with a persistent cough and back pain.  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 She's missed 3 days of work as a result. She completed a course of omnicef  for seven days and two rounds of prednisone , including a short burst and a longer taper, without significant improvement. She's still finishing the taper. Chest x ray last week was clear.   She sometimes experiences an expiratory wheeze, mostly in the upper chest, and notes that she is now producing some yellow phlegm. No recent fevers, facial tenderness, headaches, ear pain, or hemoptysis. Does have some postnasal drainage.   She has a history of allergies and asthma, for which she previously received allergy shots. In the past, when she became ill, she required antibiotics such as Avelox or Levaquin . She is currently using a Breztri  inhaler twice a day and a rescue inhaler as needed, though the rescue inhaler provides only moderate relief. She also takes Allegra daily.  She reports back pain  associated with coughing, which she describes as painful. She is currently taking Mucinex  DM twice a day. She is still tapering off prednisone , with about three or four days remaining, and is unsure if she had benefit with higher doses. She uses Flonase  for nasal symptoms and reports some nasal drainage.   She is concerned about the impact of her symptoms on her work, noting the fast-paced nature of her job and the potential for disciplinary action due to missed work. She is eager for relief from her symptoms. She is feeling more short winded with heavier exertion and coughing spells.       Allergies  Allergen Reactions   Bactrim    Otezla  [Apremilast ] Hives   Penicillins     Immunization History  Administered Date(s) Administered   Fluad Trivalent(High Dose 65+) 02/07/2023   Influenza, Quadrivalent, Recombinant, Inj, Pf 01/21/2019   Influenza-Unspecified 01/23/2014, 12/25/2014, 01/23/2018, 01/24/2020, 01/23/2021   PFIZER(Purple Top)SARS-COV-2 Vaccination 04/20/2019, 05/09/2019, 02/01/2020   PNEUMOCOCCAL CONJUGATE-20 03/29/2023   Pneumococcal Conjugate-13 11/28/2006   Pneumococcal Polysaccharide-23 02/14/2013   Td 07/03/2006   Tdap 07/22/2021   Zoster Recombinant(Shingrix) 01/21/2019, 06/19/2020    Past Medical History:  Diagnosis Date   Centrilobular emphysema (HCC) 12/11/2019   Mild noted on CT for lung cancer screen 2021; former long term smoker.   GERD without esophagitis    HLD (hyperlipidemia)    Hypothyroidism    Mixed hyperlipidemia 02/20/2019   Psoriasis    Sleep initiation dysfunction 09/27/2011    Tobacco History: Social History  Tobacco Use  Smoking Status Former   Current packs/day: 0.00   Average packs/day: 1 pack/day for 32.0 years (32.0 ttl pk-yrs)   Types: Cigarettes   Start date: 24   Quit date: 2006   Years since quitting: 19.4  Smokeless Tobacco Never   Counseling given: Not Answered   Outpatient Medications Prior to Visit  Medication Sig  Dispense Refill   albuterol  (VENTOLIN  HFA) 108 (90 Base) MCG/ACT inhaler Inhale 2 puffs into the lungs every 6 (six) hours as needed.     benzonatate  (TESSALON ) 100 MG capsule Take 1-2 capsules (100-200 mg total) by mouth every 8 (eight) hours as needed. 21 capsule 0   budesonide -glycopyrrolate -formoterol  (BREZTRI  AEROSPHERE) 160-9-4.8 MCG/ACT AERO inhaler Inhale 2 puffs into the lungs in the morning and at bedtime. 10.7 g 5   esomeprazole  (NEXIUM ) 20 MG capsule Take 1 capsule (20 mg total) by mouth daily. 90 capsule 3   fluticasone  (FLONASE ) 50 MCG/ACT nasal spray Place 1 spray into both nostrils daily. 16 g 2   furosemide  (LASIX ) 20 MG tablet Take 1 tablet (20 mg total) by mouth daily as needed for edema. Take with potassium 90 tablet 3   levothyroxine  (SYNTHROID ) 125 MCG tablet Take 1 tablet (125 mcg total) by mouth daily before breakfast. 45 tablet 5   meloxicam  (MOBIC ) 15 MG tablet Take 1 tablet (15 mg total) by mouth daily. 30 tablet 0   mometasone  (ELOCON ) 0.1 % cream qd to bid 5 days a week to aa psoriasis prn flares 45 g 0   Multiple Vitamin (MULTIVITAMIN) tablet Take 1 tablet by mouth daily.     Naltrexone -buPROPion  HCl ER 8-90 MG TB12 Start 1 tablet every morning for 7 days, then 1 tablet twice daily for 7 days, then 2 tablets every morning and one in the evening 120 tablet 0   potassium chloride  SA (KLOR-CON  M) 20 MEQ tablet Take 1 tablet (20 mEq total) by mouth daily as needed (leg swelling). Take with furosemide  90 tablet 3   predniSONE  (DELTASONE ) 20 MG tablet Take 2 tablets (40 mg total) by mouth daily with breakfast for 3 days, THEN 1.5 tablets (30 mg total) daily with breakfast for 3 days, THEN 1 tablet (20 mg total) daily with breakfast for 3 days, THEN 0.5 tablets (10 mg total) daily with breakfast for 3 days. 15 tablet 0   rosuvastatin  (CRESTOR ) 10 MG tablet Take 1 tablet (10 mg total) by mouth daily. 90 tablet 3   sertraline  (ZOLOFT ) 100 MG tablet Take 1 tablet (100 mg total) by  mouth at bedtime. 90 tablet 3   zolpidem  (AMBIEN ) 5 MG tablet Take 1 - 2 tablets (5 - 10 mg total) by mouth at bedtime as needed. 60 tablet 5   No facility-administered medications prior to visit.     Review of Systems:   Constitutional: No weight loss or gain, night sweats, fevers, chills, fatigue, or lassitude. HEENT: No headaches, difficulty swallowing, tooth/dental problems, or sore throat. No sneezing, itching, ear ache, nasal congestion, or post nasal drip CV:  No chest pain, orthopnea, PND, swelling in lower extremities, anasarca, dizziness, palpitations, syncope Resp: No shortness of breath with exertion or at rest. No excess mucus or change in color of mucus. No productive or non-productive. No hemoptysis. No wheezing.  No chest wall deformity GI:  No heartburn, indigestion, abdominal pain, nausea, vomiting, diarrhea, change in bowel habits, loss of appetite, bloody stools.  GU: No dysuria, change in color of urine, urgency or frequency.  No  flank pain, no hematuria  Skin: No rash, lesions, ulcerations MSK:  No joint pain or swelling.  No decreased range of motion.  No back pain. Neuro: No dizziness or lightheadedness.  Psych: No depression or anxiety. Mood stable.     Physical Exam:  BP 112/68 (BP Location: Right Arm, Patient Position: Sitting)   Pulse 68   Temp 98.4 F (36.9 C) (Oral)   Ht 5\' 2"  (1.575 m)   Wt 221 lb 9.6 oz (100.5 kg)   SpO2 96%   BMI 40.53 kg/m   GEN: Pleasant, interactive, well-nourished/chronically-ill appearing/acutely-ill appearing/poorly-nourished/morbidly obese; in no acute distress.****** HEENT:  Normocephalic and atraumatic. EACs patent bilaterally. TM pearly gray with present light reflex bilaterally. PERRLA. Sclera white. Nasal turbinates pink, moist and patent bilaterally. No rhinorrhea present. Oropharynx pink and moist, without exudate or edema. No lesions, ulcerations, or postnasal drip.  NECK:  Supple w/ fair ROM. No JVD present. Normal  carotid impulses w/o bruits. Thyroid  symmetrical with no goiter or nodules palpated. No lymphadenopathy.   CV: RRR, no m/r/g, no peripheral edema. Pulses intact, +2 bilaterally. No cyanosis, pallor or clubbing. PULMONARY:  Unlabored, regular breathing. Clear bilaterally A&P w/o wheezes/rales/rhonchi. No accessory muscle use.  GI: BS present and normoactive. Soft, non-tender to palpation. No organomegaly or masses detected. No CVA tenderness. MSK: No erythema, warmth or tenderness. Cap refil <2 sec all extrem. No deformities or joint swelling noted.  Neuro: A/Ox3. No focal deficits noted.   Skin: Warm, no lesions or rashe Psych: Normal affect and behavior. Judgement and thought content appropriate.     Lab Results:  CBC    Component Value Date/Time   WBC 6.0 08/15/2023 1112   RBC 4.45 08/15/2023 1112   HGB 14.6 08/15/2023 1112   HCT 43.4 08/15/2023 1112   PLT 197.0 08/15/2023 1112   MCV 97.4 08/15/2023 1112   MCH 32.3 09/26/2018 1140   MCHC 33.6 08/15/2023 1112   RDW 14.1 08/15/2023 1112   LYMPHSABS 1.0 08/15/2023 1112   MONOABS 0.6 08/15/2023 1112   EOSABS 0.1 08/15/2023 1112   BASOSABS 0.0 08/15/2023 1112    BMET    Component Value Date/Time   NA 141 08/15/2023 1112   K 4.1 08/15/2023 1112   CL 105 08/15/2023 1112   CO2 27 08/15/2023 1112   GLUCOSE 112 (H) 08/15/2023 1112   BUN 11 08/15/2023 1112   CREATININE 1.02 08/15/2023 1112   CREATININE 1.16 (H) 09/26/2018 1140   CALCIUM  9.1 08/15/2023 1112   GFRNONAA 51 (L) 09/26/2018 1140   GFRAA 59 (L) 09/26/2018 1140    BNP No results found for: "BNP"   Imaging:  DG Chest 2 View Result Date: 09/27/2023 CLINICAL DATA:  Cough. EXAM: CHEST - 2 VIEW COMPARISON:  Chest radiograph dated 05/30/2017. FINDINGS: The heart size and mediastinal contours are within normal limits. Both lungs are clear. The visualized skeletal structures are unremarkable. IMPRESSION: No active cardiopulmonary disease. Electronically Signed   By: Angus Bark M.D.   On: 09/27/2023 13:29    ipratropium-albuterol  (DUONEB) 0.5-2.5 (3) MG/3ML nebulizer solution 3 mL     Date Action Dose Route User   Discharged on 09/27/2023   Admitted on 09/27/2023   09/26/2023 1120 Given 3 mL Nebulization Kijania-Swaringen, Kelly R, CMA      methylPREDNISolone  acetate (DEPO-MEDROL ) injection 0.8 mg     Date Action Dose Route User   Discharged on 09/27/2023   Admitted on 09/27/2023   09/26/2023 1302 Given 0.8 mg Intramuscular (Right Upper Outer  Quadrant) Cristino Donna, CMA          Latest Ref Rng & Units 06/15/2017   10:54 AM  PFT Results  FVC-Pre L 3.28   FVC-Predicted Pre % 102   FVC-Post L 3.21   FVC-Predicted Post % 100   Pre FEV1/FVC % % 84   Post FEV1/FCV % % 82   FEV1-Pre L 2.76   FEV1-Predicted Pre % 111   FEV1-Post L 2.63   DLCO uncorrected ml/min/mmHg 21.40   DLCO UNC% % 93   DLCO corrected ml/min/mmHg 20.15   DLCO COR %Predicted % 87   DLVA Predicted % 87   TLC L 5.50   TLC % Predicted % 112   RV % Predicted % 102     No results found for: "NITRICOXIDE"      Assessment & Plan:   No problem-specific Assessment & Plan notes found for this encounter. Assessment and Plan    DOE/acute cough Unresolved bronchitic like illness with associated sinus symptoms despite empiric abx and two prednisone  courses. Recent CXR clear. No significant bronchospasm on exam. Very minimal rhonchi to LLL. Pleuritic pain vs costochondritis located right scapular region. Unable to rule out PE given failure to improve. STAT CTA chest ordered to rule out PE and assess for underlying pulmonary etiology. Consider alternative abx course pending results.  - Order CTA of the chest with contrast to evaluate for pulmonary embolism and other potential lung issues. - Check kidney function prior to CTA to ensure safe administration of contrast. - If CTA is negative for pulmonary embolism, consider another antibiotic for possible persistent  sinobronchitis  - Prescribe cough syrup for symptomatic relief, advising against driving due to potential drowsiness.  Upper airway cough syndrome Cough may be due to upper airway cough syndrome, with irritation of the cough reflex in the upper airway. Target sinus symptoms aggressively and control cough - Saline rinses twice daily followed with flonase  - OTC pseudoephedrine for short course - Hycodan cough syrup 5 mL every 6 hours as needed for cough. Side effect profile reviewed.   Sinobronchitis; otitis media with serous effusion  Sinus congestion with Eustachian tube dysfunction. Sinus congestion likely contributing to symptoms. - Recommend saline nasal rinses twice a day using bottled distilled water and salt packets. - Advise use of Flonase  nasal spray 30 minutes after nasal rinses. - Suggest using Sudafed (pseudoephedrine) for decongestion, with caution regarding potential increase in heart rate and blood pressure. - Consider Afrin nasal spray for 3-5 days if Sudafed is not tolerated.  Asthma Asthma with increased shortness of breath on exertion. Using rescue inhaler with some relief, but not requiring frequent use. Monitoring for increased use of rescue inhaler as an indicator for potential adjustment in asthma management. - Continue using Brovana inhaler twice a day.        Advised if symptoms do not improve or worsen, to please contact office for sooner follow up or seek emergency care.   I spent *** minutes of dedicated to the care of this patient on the date of this encounter to include pre-visit review of records, face-to-face time with the patient discussing conditions above, post visit ordering of testing, clinical documentation with the electronic health record, making appropriate referrals as documented, and communicating necessary findings to members of the patients care team.  Roetta Clarke, NP 10/03/2023  Pt aware and understands NP's role.

## 2023-10-04 ENCOUNTER — Other Ambulatory Visit: Payer: Self-pay

## 2023-10-04 ENCOUNTER — Other Ambulatory Visit (HOSPITAL_COMMUNITY): Payer: Self-pay

## 2023-10-04 ENCOUNTER — Ambulatory Visit: Payer: Self-pay | Admitting: Nurse Practitioner

## 2023-10-04 ENCOUNTER — Encounter: Payer: Self-pay | Admitting: Nurse Practitioner

## 2023-10-04 DIAGNOSIS — S2231XA Fracture of one rib, right side, initial encounter for closed fracture: Secondary | ICD-10-CM

## 2023-10-04 DIAGNOSIS — J329 Chronic sinusitis, unspecified: Secondary | ICD-10-CM

## 2023-10-04 MED ORDER — LIDOCAINE 5 % EX PTCH
1.0000 | MEDICATED_PATCH | CUTANEOUS | 0 refills | Status: DC
  Filled 2023-10-04 (×2): qty 30, 30d supply, fill #0

## 2023-10-04 MED ORDER — LEVOFLOXACIN 500 MG PO TABS
500.0000 mg | ORAL_TABLET | Freq: Every day | ORAL | 0 refills | Status: DC
  Filled 2023-10-04 (×2): qty 7, 7d supply, fill #0

## 2023-10-04 NOTE — Progress Notes (Signed)
 No PE. No pneumonia. Possible cough is related to unresolved sinusitis. I sent in levaquin  daily for 7 days.   She does have an anterior rib fracture, likely due to the coughing spells. Rib fractures are typically managed conservatively. Use incentive spirometer 10 times an hour while awake - please send order to DME or can buy one online. Use OTC NSAIDs such as ibuprofen or naproxen. I also sent in lidocaine  patches. The cough medication should help as well. Thanks.

## 2023-10-05 ENCOUNTER — Other Ambulatory Visit: Payer: Self-pay

## 2023-10-09 ENCOUNTER — Other Ambulatory Visit: Payer: Self-pay | Admitting: Family Medicine

## 2023-10-09 ENCOUNTER — Other Ambulatory Visit (HOSPITAL_COMMUNITY): Payer: Self-pay

## 2023-10-09 DIAGNOSIS — G4700 Insomnia, unspecified: Secondary | ICD-10-CM

## 2023-10-09 MED ORDER — ZOLPIDEM TARTRATE 5 MG PO TABS
5.0000 mg | ORAL_TABLET | Freq: Every evening | ORAL | 5 refills | Status: DC | PRN
  Filled 2023-10-09: qty 60, 30d supply, fill #0
  Filled 2023-11-07 – 2023-11-08 (×2): qty 60, 30d supply, fill #1
  Filled 2023-12-11: qty 60, 30d supply, fill #2
  Filled 2024-01-11: qty 60, 30d supply, fill #3
  Filled 2024-02-12 (×2): qty 60, 30d supply, fill #4
  Filled 2024-03-12 – 2024-03-13 (×2): qty 60, 30d supply, fill #5

## 2023-10-11 ENCOUNTER — Telehealth: Payer: Self-pay | Admitting: Nurse Practitioner

## 2023-10-11 NOTE — Telephone Encounter (Signed)
 Spoke with patient regarding prior message. Patient stated she has dropped off FMLA paperwork and will need it back to [employer] by 15 days. Patient was wondering how to pay her 29.42for her paperwork to be filled out by [employer].  Advised patient I will send a message o our front desk for help on that .

## 2023-10-11 NOTE — Telephone Encounter (Signed)
 ATC X1 LVM for patient to call our back .

## 2023-10-11 NOTE — Telephone Encounter (Signed)
 PT ret call from [employer] on FMLA ppwk

## 2023-10-11 NOTE — Telephone Encounter (Signed)
 PT dropped of FMLA paperwork 10/11/2023. She was notified of $29 fee.

## 2023-10-13 NOTE — Telephone Encounter (Signed)
 Rc'd signed paperwork for FMLA/[employer] Fax # [phone].Rc'd and attached confirmation and sent to scan. Left PT VM ppwk completed. Sent PT a copy by mail. Put copy in accordion file.

## 2023-10-20 ENCOUNTER — Ambulatory Visit: Admitting: Family Medicine

## 2023-10-21 ENCOUNTER — Other Ambulatory Visit (HOSPITAL_COMMUNITY): Payer: Self-pay

## 2023-10-24 ENCOUNTER — Other Ambulatory Visit: Payer: Self-pay

## 2023-10-25 ENCOUNTER — Encounter: Payer: Self-pay | Admitting: Nurse Practitioner

## 2023-10-25 ENCOUNTER — Ambulatory Visit: Admitting: Nurse Practitioner

## 2023-10-25 VITALS — BP 108/82 | HR 82 | Ht 66.0 in | Wt 222.6 lb

## 2023-10-25 DIAGNOSIS — J4530 Mild persistent asthma, uncomplicated: Secondary | ICD-10-CM

## 2023-10-25 DIAGNOSIS — J432 Centrilobular emphysema: Secondary | ICD-10-CM

## 2023-10-25 DIAGNOSIS — J301 Allergic rhinitis due to pollen: Secondary | ICD-10-CM | POA: Diagnosis not present

## 2023-10-25 NOTE — Assessment & Plan Note (Signed)
 See above

## 2023-10-25 NOTE — Assessment & Plan Note (Signed)
 Asthma/emphysema. Clinically improved. Prior slow to resolve exacerbation secondary to viral illness with component of sinusitis contributing to symptoms. Treated with empiric levaquin , steroids and supportive care measures with resolution. Continue current maintenance regimen. FMLA paperwork corrected at [facility] and faxed by CMA. Action plan in place. Encouraged to remain active.  Patient Instructions  Continue Breztri  2 puffs Twice daily. Brush tongue and rinse mouth afterwards Continue Albuterol  inhaler 2 puffs every 6 hours as needed for shortness of breath or wheezing. Notify if symptoms persist despite rescue inhaler/neb use.  Continue Use saline nasal rinses twice daily with bottled distilled water. Follow with flonase  nasal spray 2 sprays each nostril daily   Follow up in 4 months with Dr. Jude or Susan Burch. If symptoms do not improve or worsen, please contact office for sooner follow up or seek emergency care.

## 2023-10-25 NOTE — Assessment & Plan Note (Signed)
 Improved. Continue allergy regimen

## 2023-10-25 NOTE — Progress Notes (Signed)
 @Patient  ID: Susan Burch, female    DOB: 04-13-1956, 67 y.o.   MRN: [id number]  Chief Complaint  Patient presents with   Follow-up    Sob has improved.    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 She is a patient of Dr. Cyndi and last seen in office 10/03/2023 by [facility] NP. Past medical history significant for GERD, allergic rhinitis, hypothyroid, anxiety, depression, obesity, HLD.   TEST/EVENTS:  2019 PFT: FVC 102, FEV1 111, ratio 82, TLC 112, DLCO 87 09/27/2023 CXR: clear lungs 10/03/2023 CTA chest: negative for PE. Atelectasis bibasilar. Unchanged 3 mm RUL nodule, benign. Mildly displaced right fourth rib fx.   09/26/2023: OV with Susan Burch. Acute visit. Seen in urgent care 5/19 with 4 days of cough, chest congestion, wheezing. Prednisone , omnicef , benzonatate  rx. Having some CP with coughing and deep inspiration. Not severe enough to try any OTC analgesic. Has not been taking the Breztri  for last 2 years. Has not been sick up until now. No exertional hypoxia. Steroid shot and neb in office. Advised to start allegra and flonase . Resume Breztri .   10/03/2023: Susan Burch with Susan Aman NP Discussed the use of AI scribe software for clinical note transcription with the patient, who gave verbal consent to proceed. Susan Burch is a 68 year old female with asthma who presents with a persistent cough and back pain. 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 She's missed 3 days of work as a result. She completed a course of omnicef  for seven days and two rounds of prednisone , including a short burst and a longer taper, without significant improvement. She's still finishing the taper. Chest x ray last week was clear.  She sometimes experiences an expiratory wheeze, mostly in the upper chest, and notes that she  is now producing some yellow phlegm. No recent fevers, facial tenderness, headaches, ear pain, or hemoptysis. Does have some postnasal drainage.  She has a history of allergies and asthma, for which she previously received allergy shots. In the past, when she became ill, she required antibiotics such as Avelox or Levaquin . She is currently using a Breztri  inhaler twice a day and a rescue inhaler as needed, though the rescue inhaler provides only moderate relief. She also takes Allegra daily. She reports back pain associated with coughing, which she describes as painful. She is currently taking Mucinex  DM twice a day. She is still tapering off prednisone , with about three or four days remaining, and is unsure if she had benefit with higher doses. She uses Flonase  for nasal symptoms and reports some nasal drainage.  She is concerned about the impact of her symptoms on her work, noting the fast-paced nature of her job and the potential for disciplinary action due to missed work. She is eager for relief from her symptoms. She is feeling more short winded with heavier exertion and coughing spells.  FeNO 5 ppb   10/25/2023: Today - follow up Discussed the use of AI scribe software for clinical note transcription with the patient, who gave verbal consent to proceed.  History of Present Illness   Susan Burch is a 68 year old female with emphysema/asthma who presents for follow-up regarding her respiratory symptoms.  After her last visit, she had CTA chest that was negative for PE or  acute process.   She feels better with her symptoms returning to normal. No current breathing issues and her cough has resolved. She has returned to work and is not using any cough syrups.  She continues to use nasal rinses and uses her albuterol  inhaler three to four times a week, which is an improvement. She is still taking Breztri  twice a day. Feels this helps.   A rib fracture was identified on a CT scan, likely due to  excessive coughing. She is no longer experiencing soreness from the fracture.  She discusses her FMLA paperwork, noting corrections needed regarding the duration and frequency of her condition. She is coordinating with her FMLA contact to ensure the paperwork is correctly completed and faxed back.  She plans to retire in the spring and is considering her Medicare options.       Allergies  Allergen Reactions   Bactrim    Otezla  [Apremilast ] Hives   Penicillins     Immunization History  Administered Date(s) Administered   Fluad Trivalent(High Dose 65+) 02/07/2023   Influenza, Quadrivalent, Recombinant, Inj, Pf 01/21/2019   Influenza-Unspecified 01/23/2014, 12/25/2014, 01/23/2018, 01/24/2020, 01/23/2021   PFIZER(Purple Top)SARS-COV-2 Vaccination 04/20/2019, 05/09/2019, 02/01/2020   PNEUMOCOCCAL CONJUGATE-20 03/29/2023   Pneumococcal Conjugate-13 11/28/2006   Pneumococcal Polysaccharide-23 02/14/2013   Td 07/03/2006   Tdap 07/22/2021   Zoster Recombinant(Shingrix) 01/21/2019, 06/19/2020    Past Medical History:  Diagnosis Date   Centrilobular emphysema (HCC) 12/11/2019   Mild noted on CT for lung cancer screen 2021; former long term smoker.   GERD without esophagitis    HLD (hyperlipidemia)    Hypothyroidism    Mixed hyperlipidemia 02/20/2019   Psoriasis    Sleep initiation dysfunction 09/27/2011    Tobacco History: Social History   Tobacco Use  Smoking Status Former   Current packs/day: 0.00   Average packs/day: 1 pack/day for 32.0 years (32.0 ttl pk-yrs)   Types: Cigarettes   Start date: 89   Quit date: 2006   Years since quitting: 19.5  Smokeless Tobacco Never   Counseling given: Not Answered   Outpatient Medications Prior to Visit  Medication Sig Dispense Refill   albuterol  (VENTOLIN  HFA) 108 (90 Base) MCG/ACT inhaler Inhale 2 puffs into the lungs every 6 (six) hours as needed.     budesonide -glycopyrrolate -formoterol  (BREZTRI  AEROSPHERE) 160-9-4.8  MCG/ACT AERO inhaler Inhale 2 puffs into the lungs in the morning and at bedtime. 10.7 g 5   esomeprazole  (NEXIUM ) 20 MG capsule Take 1 capsule (20 mg total) by mouth daily. 90 capsule 3   fluticasone  (FLONASE ) 50 MCG/ACT nasal spray Place 1 spray into both nostrils daily. 16 g 2   furosemide  (LASIX ) 20 MG tablet Take 1 tablet (20 mg total) by mouth daily as needed for edema. Take with potassium 90 tablet 3   HYDROcodone  bit-homatropine (HYCODAN) 5-1.5 MG/5ML syrup Take 5 mLs by mouth every 6 (six) hours as needed for cough. 180 mL 0   levothyroxine  (SYNTHROID ) 125 MCG tablet Take 1 tablet (125 mcg total) by mouth daily before breakfast. 45 tablet 5   Multiple Vitamin (MULTIVITAMIN) tablet Take 1 tablet by mouth daily.     potassium chloride  SA (KLOR-CON  M) 20 MEQ tablet Take 1 tablet (20 mEq total) by mouth daily as needed (leg swelling). Take with furosemide  90 tablet 3   rosuvastatin  (CRESTOR ) 10 MG tablet Take 1 tablet (10 mg total) by mouth daily. 90 tablet 3   sertraline  (ZOLOFT ) 100 MG tablet Take 1 tablet (100 mg total) by  mouth at bedtime. 90 tablet 3   zolpidem  (AMBIEN ) 5 MG tablet Take 1 - 2 tablets (5 - 10 mg total) by mouth at bedtime as needed. 60 tablet 5   benzonatate  (TESSALON ) 100 MG capsule Take 1-2 capsules (100-200 mg total) by mouth every 8 (eight) hours as needed. (Patient not taking: Reported on 10/25/2023) 21 capsule 0   levofloxacin  (LEVAQUIN ) 500 MG tablet Take 1 tablet (500 mg total) by mouth daily. (Patient not taking: Reported on 10/25/2023) 7 tablet 0   lidocaine  (LIDODERM ) 5 % Place 1 patch onto the skin daily. Remove & Discard patch within 12 hours or as directed by MD (Patient not taking: Reported on 10/25/2023) 30 patch 0   meloxicam  (MOBIC ) 15 MG tablet Take 1 tablet (15 mg total) by mouth daily. (Patient not taking: Reported on 10/25/2023) 30 tablet 0   mometasone  (ELOCON ) 0.1 % cream qd to bid 5 days a week to aa psoriasis prn flares (Patient not taking: Reported on  10/25/2023) 45 g 0   Naltrexone -buPROPion  HCl ER 8-90 MG TB12 Start 1 tablet every morning for 7 days, then 1 tablet twice daily for 7 days, then 2 tablets every morning and one in the evening (Patient not taking: Reported on 10/25/2023) 120 tablet 0   No facility-administered medications prior to visit.     Review of Systems:   Constitutional: No weight loss or gain, night sweats, fevers, chills, fatigue, or lassitude. HEENT: No headaches, difficulty swallowing, tooth/dental problems, or sore throat. No sneezing, itching, ear ache +nasal congestion, post nasal drip CV:  No chest pain, orthopnea, PND, swelling in lower extremities, anasarca, dizziness, palpitations, syncope Resp: +shortness of breath with exertion. No wheeze, No cough. No hemoptysis. No chest wall deformity GI:  No heartburn, indigestion GU: No dysuria, change in color of urine, urgency or frequency.  No flank pain, no hematuria  Skin: No rash, lesions, ulcerations MSK:  No joint pain or swelling.   Neuro: No dizziness or lightheadedness.  Psych: No depression or anxiety. Mood stable.     Physical Exam:  BP 108/82 (BP Location: Left Arm, Patient Position: Sitting, Cuff Size: Large)   Pulse 82   Ht 5' 6 (1.676 m)   Wt 222 lb 9.6 oz (101 kg)   SpO2 96%   BMI 35.93 kg/m   GEN: Pleasant, interactive, well-kempt; non-toxic and in no acute distress HEENT:  Normocephalic and atraumatic.PERRLA. Sclera white. Nasal turbinates pink, moist and patent bilaterally. No rhinorrhea present. Oropharynx pink and moist, without exudate or edema. No lesions, ulcerations, or postnasal drip.  NECK:  Supple w/ fair ROM. No JVD present. Normal carotid impulses w/o bruits. Thyroid  symmetrical with no goiter or nodules palpated. No lymphadenopathy.   CV: RRR, no m/r/g, no peripheral edema. Pulses intact, +2 bilaterally. No cyanosis, pallor or clubbing. PULMONARY:  Unlabored, regular breathing. Clear bilaterally A&P w/o wheezes/rales/rhonchi.  No accessory muscle use.  GI: BS present and normoactive. Soft, non-tender to palpation.  MSK: No erythema, warmth or tenderness. Cap refil <2 sec all extrem.  Neuro: A/Ox3. No focal deficits noted.   Skin: Warm, no lesions or rashe Psych: Normal affect and behavior. Judgement and thought content appropriate.     Lab Results:  CBC    Component Value Date/Time   WBC 6.0 08/15/2023 1112   RBC 4.45 08/15/2023 1112   HGB 14.6 08/15/2023 1112   HCT 43.4 08/15/2023 1112   PLT 197.0 08/15/2023 1112   MCV 97.4 08/15/2023 1112   MCH 32.3  09/26/2018 1140   MCHC 33.6 08/15/2023 1112   RDW 14.1 08/15/2023 1112   LYMPHSABS 1.0 08/15/2023 1112   MONOABS 0.6 08/15/2023 1112   EOSABS 0.1 08/15/2023 1112   BASOSABS 0.0 08/15/2023 1112    BMET    Component Value Date/Time   NA 138 10/03/2023 1052   K 4.4 10/03/2023 1052   CL 104 10/03/2023 1052   CO2 25 10/03/2023 1052   GLUCOSE 113 (H) 10/03/2023 1052   BUN 16 10/03/2023 1052   CREATININE 1.08 (H) 10/03/2023 1052   CALCIUM  8.9 10/03/2023 1052   GFRNONAA 51 (L) 09/26/2018 1140   GFRAA 59 (L) 09/26/2018 1140    BNP No results found for: BNP   Imaging:  CT Angio Chest Pulmonary Embolism (PE) W or WO Contrast Result Date: 10/03/2023 CLINICAL DATA:  Pulmonary embolism (PE) suspected, high prob EXAM: CT ANGIOGRAPHY CHEST WITH CONTRAST TECHNIQUE: Multidetector CT imaging of the chest was performed using the standard protocol during bolus administration of intravenous contrast. Multiplanar CT image reconstructions and MIPs were obtained to evaluate the vascular anatomy. RADIATION DOSE REDUCTION: This exam was performed according to the departmental dose-optimization program which includes automated exposure control, adjustment of the mA and/or kV according to patient size and/or use of iterative reconstruction technique. CONTRAST:  80mL OMNIPAQUE  IOHEXOL  350 MG/ML SOLN COMPARISON:  November 13, 2019 FINDINGS: Pulmonary Embolism: No pulmonary  embolism. Cardiovascular: No cardiomegaly or pericardial effusion. No aortic aneurysm. Mediastinum/Nodes: No mediastinal mass. No mediastinal, hilar, or axillary lymphadenopathy. Lungs/Pleura: The midline trachea and bronchi are patent. Posterior bibasilar dependent atelectasis. No focal airspace consolidation, pleural effusion, or pneumothorax. Unchanged 3 mm lateral right upper lobe nodule (axial 38), benign. Musculoskeletal: Mildly displaced anterior right fourth rib fracture. Multilevel degenerative disc disease of the spine. Upper Abdomen: No acute abnormality in the partially visualized upper abdomen. Review of the MIP images confirms the above findings. IMPRESSION: 1. Mildly displaced, anterior right fourth rib fracture. No pneumothorax. 2. No pulmonary embolism. Aortic Atherosclerosis (ICD10-I70.0). Electronically Signed   By: Rogelia Myers M.D.   On: 10/03/2023 15:33   DG Chest 2 View Result Date: 09/27/2023 CLINICAL DATA:  Cough. EXAM: CHEST - 2 VIEW COMPARISON:  Chest radiograph dated 05/30/2017. FINDINGS: The heart size and mediastinal contours are within normal limits. Both lungs are clear. The visualized skeletal structures are unremarkable. IMPRESSION: No active cardiopulmonary disease. Electronically Signed   By: Vanetta Chou M.D.   On: 09/27/2023 13:29    ipratropium-albuterol  (DUONEB) 0.5-2.5 (3) MG/3ML nebulizer solution 3 mL     Date Action Dose Route User   Discharged on 09/27/2023   Admitted on 09/27/2023   09/26/2023 1120 Given 3 mL Nebulization Kijania-Swaringen, Burnard SAUNDERS, CMA      methylPREDNISolone  acetate (DEPO-MEDROL ) injection 0.8 mg     Date Action Dose Route User   Discharged on 09/27/2023   Admitted on 09/27/2023   09/26/2023 1302 Given 0.8 mg Intramuscular (Right Upper Outer Quadrant) Armand Burnard SAUNDERS, CMA          Latest Ref Rng & Units 06/15/2017   10:54 AM  PFT Results  FVC-Pre L 3.28   FVC-Predicted Pre % 102   FVC-Post L 3.21   FVC-Predicted Post  % 100   Pre FEV1/FVC % % 84   Post FEV1/FCV % % 82   FEV1-Pre L 2.76   FEV1-Predicted Pre % 111   FEV1-Post L 2.63   DLCO uncorrected ml/min/mmHg 21.40   DLCO UNC% % 93   DLCO corrected ml/min/mmHg  20.15   DLCO COR %Predicted % 87   DLVA Predicted % 87   TLC L 5.50   TLC % Predicted % 112   RV % Predicted % 102     Lab Results  Component Value Date   NITRICOXIDE <5 10/03/2023        Assessment & Plan:   Mild persistent asthma Asthma/emphysema. Clinically improved. Prior slow to resolve exacerbation secondary to viral illness with component of sinusitis contributing to symptoms. Treated with empiric levaquin , steroids and supportive care measures with resolution. Continue current maintenance regimen. FMLA paperwork corrected at OV and faxed by CMA. Action plan in place. Encouraged to remain active.  Patient Instructions  Continue Breztri  2 puffs Twice daily. Brush tongue and rinse mouth afterwards Continue Albuterol  inhaler 2 puffs every 6 hours as needed for shortness of breath or wheezing. Notify if symptoms persist despite rescue inhaler/neb use.  Continue Use saline nasal rinses twice daily with bottled distilled water. Follow with flonase  nasal spray 2 sprays each nostril daily   Follow up in 4 months with Dr. Jude or Izetta Malachy PIETY. If symptoms do not improve or worsen, please contact office for sooner follow up or seek emergency care.    Centrilobular emphysema (HCC) See above  Allergic rhinitis Improved. Continue allergy regimen   Advised if symptoms do not improve or worsen, to please contact office for sooner follow up or seek emergency care.   I spent 25 minutes of dedicated to the care of this patient on the date of this encounter to include pre-visit review of records, face-to-face time with the patient discussing conditions above, post visit ordering of testing, clinical documentation with the electronic health record, making appropriate referrals as  documented, and communicating necessary findings to members of the patients care team.  Comer LULLA Malachy, NP 10/25/2023  Pt aware and understands NP's role.

## 2023-10-25 NOTE — Patient Instructions (Addendum)
 Continue Breztri  2 puffs Twice daily. Brush tongue and rinse mouth afterwards Continue Albuterol  inhaler 2 puffs every 6 hours as needed for shortness of breath or wheezing. Notify if symptoms persist despite rescue inhaler/neb use.  Continue Use saline nasal rinses twice daily with bottled distilled water. Follow with flonase  nasal spray 2 sprays each nostril daily   Follow up in 4 months with Dr. Jude or Izetta Malachy PIETY. If symptoms do not improve or worsen, please contact office for sooner follow up or seek emergency care.

## 2023-11-01 ENCOUNTER — Ambulatory Visit: Admitting: Family Medicine

## 2023-11-06 ENCOUNTER — Other Ambulatory Visit (HOSPITAL_COMMUNITY): Payer: Self-pay

## 2023-11-07 ENCOUNTER — Other Ambulatory Visit (HOSPITAL_COMMUNITY): Payer: Self-pay

## 2023-11-08 ENCOUNTER — Other Ambulatory Visit: Payer: Self-pay

## 2023-11-08 ENCOUNTER — Other Ambulatory Visit (HOSPITAL_COMMUNITY): Payer: Self-pay

## 2023-11-09 ENCOUNTER — Other Ambulatory Visit (HOSPITAL_COMMUNITY): Payer: Self-pay

## 2023-11-09 DIAGNOSIS — J45909 Unspecified asthma, uncomplicated: Secondary | ICD-10-CM | POA: Diagnosis not present

## 2023-11-09 DIAGNOSIS — E039 Hypothyroidism, unspecified: Secondary | ICD-10-CM | POA: Diagnosis not present

## 2023-11-09 DIAGNOSIS — N951 Menopausal and female climacteric states: Secondary | ICD-10-CM | POA: Diagnosis not present

## 2023-11-09 DIAGNOSIS — Z01419 Encounter for gynecological examination (general) (routine) without abnormal findings: Secondary | ICD-10-CM | POA: Diagnosis not present

## 2023-11-13 ENCOUNTER — Other Ambulatory Visit: Payer: Self-pay

## 2023-11-15 ENCOUNTER — Telehealth: Payer: Self-pay

## 2023-11-15 ENCOUNTER — Ambulatory Visit: Admitting: Family Medicine

## 2023-11-15 NOTE — Telephone Encounter (Signed)
 Patient's insurance is denying prescription for Vtama  stating unable to use concurrently with other systemic immunomodulating agents, corticosteroids, or topical non-steroidals. Spoke with patient to advise her of above and patient states she is not using vtama  and her psoriasis has been clear.   Prior auth denial letter scanned into patients media for records.

## 2023-11-20 ENCOUNTER — Other Ambulatory Visit: Payer: Self-pay

## 2023-11-22 ENCOUNTER — Other Ambulatory Visit: Payer: Commercial Managed Care - PPO

## 2023-11-22 DIAGNOSIS — E039 Hypothyroidism, unspecified: Secondary | ICD-10-CM | POA: Diagnosis not present

## 2023-11-22 LAB — T4, FREE: Free T4: 1.2 ng/dL (ref 0.8–1.8)

## 2023-11-22 LAB — TSH: TSH: 7.72 m[IU]/L — ABNORMAL HIGH (ref 0.40–4.50)

## 2023-11-23 ENCOUNTER — Ambulatory Visit: Payer: Self-pay | Admitting: Internal Medicine

## 2023-11-23 ENCOUNTER — Other Ambulatory Visit: Payer: Self-pay

## 2023-11-23 ENCOUNTER — Other Ambulatory Visit (HOSPITAL_COMMUNITY): Payer: Self-pay

## 2023-11-23 DIAGNOSIS — E039 Hypothyroidism, unspecified: Secondary | ICD-10-CM

## 2023-11-23 MED ORDER — LEVOTHYROXINE SODIUM 137 MCG PO TABS
125.0000 ug | ORAL_TABLET | Freq: Every day | ORAL | 2 refills | Status: DC
  Filled 2023-11-23 (×2): qty 45, 45d supply, fill #0
  Filled 2024-01-04: qty 45, 45d supply, fill #1
  Filled 2024-02-17: qty 45, 45d supply, fill #2

## 2023-12-11 ENCOUNTER — Other Ambulatory Visit (HOSPITAL_COMMUNITY): Payer: Self-pay

## 2023-12-11 ENCOUNTER — Other Ambulatory Visit: Payer: Self-pay

## 2023-12-13 NOTE — Progress Notes (Unsigned)
 [facility] [address] [place] [address] Phone: [phone] Subjective:   Susan Burch, am serving as a scribe for Dr. Arthea Claudene.  I'm seeing this patient by the request  of:  Susan [name], MD  CC: Bilateral hip and toe pain follow-up  YEP:Dlagzrupcz  Susan Burch is a 68 y.o. female coming in with complaint of B hip and great toe pain. Last seen in January for these issues.  Since we have seen patient has had some difficulty with her asthma.  Also still not well-controlled with her thyroid  with TSH still being elevated at 7.72.  Endocrinology was increasing her Synthroid  to 137 mcg.  Patient states R ankle swelling with working. Hard with inversion. Pain is on lateral side and feels tight and sore. Rest is the only home therapy at this moment. L hip has also a bit bothersome.      Past Medical History:  Diagnosis Date   Centrilobular emphysema (HCC) 12/11/2019   Mild noted on CT for lung cancer screen 2021; former long term smoker.   GERD without esophagitis    HLD (hyperlipidemia)    Hypothyroidism    Mixed hyperlipidemia 02/20/2019   Psoriasis    Sleep initiation dysfunction 09/27/2011   Past Surgical History:  Procedure Laterality Date   BREAST EXCISIONAL BIOPSY Right 1990   CESAREAN SECTION  1994, 1996   CHOLECYSTECTOMY  1998   MYOMECTOMY  1993   Social History   Socioeconomic History   Marital status: Married    Spouse name: Not on file   Number of children: Not on file   Years of education: Not on file   Highest education level: Not on file  Occupational History   Occupation: Charity fundraiser    Employer: [employer]  Tobacco Use   Smoking status: Former    Current packs/day: 0.00    Average packs/day: 1 pack/day for 32.0 years (32.0 ttl pk-yrs)    Types: Cigarettes    Start date: 69    Quit date: 2006    Years since quitting: 19.6   Smokeless tobacco: Never  Vaping Use   Vaping status: Never Used  Substance and  Sexual Activity   Alcohol use: Yes    Alcohol/week: 14.0 standard drinks of alcohol    Types: 14 Glasses of wine per week    Comment: 2 glasses per day   Drug use: Never   Sexual activity: Not on file  Other Topics Concern   Not on file  Social History Narrative   Not on file   Social Drivers of Health   Financial Resource Strain: Not on file  Food Insecurity: Not on file  Transportation Needs: Not on file  Physical Activity: Not on file  Stress: Not on file  Social Connections: Not on file   Allergies  Allergen Reactions   Bactrim    Otezla  [Apremilast ] Hives   Penicillins    Family History  Problem Relation Age of Onset   Heart Problems Mother    Hypothyroidism Mother    High blood pressure Father    High Cholesterol Father    Diabetes Brother    Diabetes Maternal Grandmother    High blood pressure Maternal Uncle    Atrial fibrillation Maternal Uncle    Hypothyroidism Maternal Uncle    Heart Problems Maternal Aunt    Cancer Maternal Aunt    Hypothyroidism Maternal Aunt    Cancer Cousin    Breast cancer Cousin  Hypothyroidism Daughter    Hypothyroidism Son     Current Outpatient Medications (Endocrine & Metabolic):    levothyroxine  (SYNTHROID ) 137 MCG tablet, Take 1 tablet (137 mcg total) by mouth daily before breakfast.  Current Outpatient Medications (Cardiovascular):    furosemide  (LASIX ) 20 MG tablet, Take 1 tablet (20 mg total) by mouth daily as needed for edema. Take with potassium   rosuvastatin  (CRESTOR ) 10 MG tablet, Take 1 tablet (10 mg total) by mouth daily.  Current Outpatient Medications (Respiratory):    albuterol  (VENTOLIN  HFA) 108 (90 Base) MCG/ACT inhaler, Inhale 2 puffs into the lungs every 6 (six) hours as needed.   benzonatate  (TESSALON ) 100 MG capsule, Take 1-2 capsules (100-200 mg total) by mouth every 8 (eight) hours as needed. (Patient not taking: Reported on 10/25/2023)   budesonide -glycopyrrolate -formoterol  (BREZTRI  AEROSPHERE)  160-9-4.8 MCG/ACT AERO inhaler, Inhale 2 puffs into the lungs in the morning and at bedtime.   fluticasone  (FLONASE ) 50 MCG/ACT nasal spray, Place 1 spray into both nostrils daily.   HYDROcodone  bit-homatropine (HYCODAN) 5-1.5 MG/5ML syrup, Take 5 mLs by mouth every 6 (six) hours as needed for cough.  Current Outpatient Medications (Analgesics):    meloxicam  (MOBIC ) 15 MG tablet, Take 1 tablet (15 mg total) by mouth daily. (Patient not taking: Reported on 10/25/2023)   Current Outpatient Medications (Other):    esomeprazole  (NEXIUM ) 20 MG capsule, Take 1 capsule (20 mg total) by mouth daily.   levofloxacin  (LEVAQUIN ) 500 MG tablet, Take 1 tablet (500 mg total) by mouth daily. (Patient not taking: Reported on 10/25/2023)   lidocaine  (LIDODERM ) 5 %, Place 1 patch onto the skin daily. Remove & Discard patch within 12 hours or as directed by MD (Patient not taking: Reported on 10/25/2023)   mometasone  (ELOCON ) 0.1 % cream, qd to bid 5 days a week to aa psoriasis prn flares (Patient not taking: Reported on 10/25/2023)   Multiple Vitamin (MULTIVITAMIN) tablet, Take 1 tablet by mouth daily.   Naltrexone -buPROPion  HCl ER 8-90 MG TB12, Start 1 tablet every morning for 7 days, then 1 tablet twice daily for 7 days, then 2 tablets every morning and one in the evening (Patient not taking: Reported on 10/25/2023)   potassium chloride  SA (KLOR-CON  M) 20 MEQ tablet, Take 1 tablet (20 mEq total) by mouth daily as needed (leg swelling). Take with furosemide    sertraline  (ZOLOFT ) 100 MG tablet, Take 1 tablet (100 mg total) by mouth at bedtime.   zolpidem  (AMBIEN ) 5 MG tablet, Take 1 - 2 tablets (5 - 10 mg total) by mouth at bedtime as needed.   Reviewed prior external information including notes and imaging from  primary care provider As well as notes that were available from care everywhere and other healthcare systems.  Past medical history, social, surgical and family history all reviewed in electronic medical record.   No pertanent information unless stated regarding to the chief complaint.   Review of Systems:  No headache, visual changes, nausea, vomiting, diarrhea, constipation, dizziness, abdominal pain, skin rash, fevers, chills, night sweats, weight loss, swollen lymph nodes, body aches, joint swelling, chest pain, shortness of breath, mood changes. POSITIVE muscle aches  Objective  Blood pressure (!) 132/90, pulse 87, height 5' 6 (1.676 m), weight 222 lb (100.7 kg), SpO2 98%.   General: No apparent distress alert and oriented x3 mood and affect normal, dressed appropriately.  HEENT: Pupils equal, extraocular movements intact  Respiratory: Patient's speak in full sentences and does not appear short of breath  Cardiovascular: No  lower extremity edema, non tender, no erythema  Right ankle does have swelling noted over the peroneal area just inferior and anterior to the lateral malleolus.  Consistent with a cyst formation.   After verbal consent patient was prepped with alcohol swab and with a 25-gauge half inch needle injected with 1 cc of 0.5% Marcaine and 1 cc of Kenalog  40 mg/mL into cyst.  Minimal blood loss.  Band-Aid placed.  Postinjection instructions given.    Impression and Recommendations:    The above documentation has been reviewed and is accurate and complete Susan Soria M Gavan Nordby, DO

## 2023-12-14 ENCOUNTER — Ambulatory Visit: Admitting: Family Medicine

## 2023-12-14 ENCOUNTER — Other Ambulatory Visit (HOSPITAL_COMMUNITY): Payer: Self-pay

## 2023-12-14 ENCOUNTER — Encounter: Payer: Self-pay | Admitting: Family Medicine

## 2023-12-14 VITALS — BP 132/90 | HR 87 | Ht 66.0 in | Wt 222.0 lb

## 2023-12-14 DIAGNOSIS — G5731 Lesion of lateral popliteal nerve, right lower limb: Secondary | ICD-10-CM | POA: Diagnosis not present

## 2023-12-14 NOTE — Patient Instructions (Signed)
 Do prescribed exercises at least 3x a week Injection today Heel lifts See you again in 2-3 months

## 2023-12-14 NOTE — Assessment & Plan Note (Signed)
 Injection in the ankle done today.  Tolerated the procedure well, discussed with patient about proper shoes, home exercises for strengthening of the lateral compartment of the ankle follow-up again in 8-12 weeks otherwise.  Known arthritic changes.

## 2023-12-20 ENCOUNTER — Other Ambulatory Visit (HOSPITAL_COMMUNITY): Payer: Self-pay

## 2023-12-26 ENCOUNTER — Encounter: Payer: Commercial Managed Care - PPO | Admitting: Dermatology

## 2024-01-03 ENCOUNTER — Other Ambulatory Visit

## 2024-01-03 DIAGNOSIS — E039 Hypothyroidism, unspecified: Secondary | ICD-10-CM | POA: Diagnosis not present

## 2024-01-03 LAB — TSH: TSH: 1.03 m[IU]/L (ref 0.40–4.50)

## 2024-01-03 LAB — T4, FREE: Free T4: 1.6 ng/dL (ref 0.8–1.8)

## 2024-01-04 ENCOUNTER — Other Ambulatory Visit (HOSPITAL_COMMUNITY): Payer: Self-pay

## 2024-01-04 ENCOUNTER — Other Ambulatory Visit

## 2024-01-11 ENCOUNTER — Other Ambulatory Visit (HOSPITAL_COMMUNITY): Payer: Self-pay

## 2024-01-16 ENCOUNTER — Other Ambulatory Visit (HOSPITAL_COMMUNITY): Payer: Self-pay

## 2024-01-22 ENCOUNTER — Other Ambulatory Visit (HOSPITAL_COMMUNITY): Payer: Self-pay

## 2024-02-02 ENCOUNTER — Other Ambulatory Visit (HOSPITAL_COMMUNITY): Payer: Self-pay

## 2024-02-06 ENCOUNTER — Encounter: Payer: Self-pay | Admitting: Family Medicine

## 2024-02-12 ENCOUNTER — Other Ambulatory Visit (HOSPITAL_COMMUNITY): Payer: Self-pay

## 2024-02-13 ENCOUNTER — Other Ambulatory Visit: Payer: Self-pay

## 2024-02-17 ENCOUNTER — Other Ambulatory Visit (HOSPITAL_COMMUNITY): Payer: Self-pay

## 2024-02-28 ENCOUNTER — Ambulatory Visit: Admitting: Nurse Practitioner

## 2024-02-28 ENCOUNTER — Encounter: Payer: Self-pay | Admitting: Nurse Practitioner

## 2024-02-28 VITALS — BP 129/78 | HR 72 | Temp 97.5°F | Ht 62.0 in | Wt 221.0 lb

## 2024-02-28 DIAGNOSIS — J432 Centrilobular emphysema: Secondary | ICD-10-CM | POA: Diagnosis not present

## 2024-02-28 DIAGNOSIS — J301 Allergic rhinitis due to pollen: Secondary | ICD-10-CM

## 2024-02-28 DIAGNOSIS — Z87891 Personal history of nicotine dependence: Secondary | ICD-10-CM | POA: Diagnosis not present

## 2024-02-28 DIAGNOSIS — J4530 Mild persistent asthma, uncomplicated: Secondary | ICD-10-CM | POA: Diagnosis not present

## 2024-02-28 DIAGNOSIS — R911 Solitary pulmonary nodule: Secondary | ICD-10-CM | POA: Diagnosis not present

## 2024-02-28 NOTE — Progress Notes (Signed)
 @Patient  ID: Susan Burch, female    DOB: [birth year], 68 y.o.   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 She is a patient of Dr. Cyndi and last seen in office 10/25/2023 by Susan Burch. Past medical history significant for GERD, allergic rhinitis, hypothyroid, anxiety, depression, obesity, HLD.   TEST/EVENTS:  2019 PFT: FVC 102, FEV1 111, ratio 82, TLC 112, DLCO 87 09/27/2023 CXR: clear lungs 10/03/2023 CTA chest: negative for PE. Atelectasis bibasilar. Unchanged 3 mm RUL nodule, benign. Mildly displaced right fourth rib fx.   09/26/2023: OV with Susan Burch. Acute visit. Seen in urgent care 5/19 with 4 days of cough, chest congestion, wheezing. Prednisone , omnicef , benzonatate  rx. Having some CP with coughing and deep inspiration. Not severe enough to try any OTC analgesic. Has not been taking the Susan Burch  for last 2 years. Has not been sick up until now. No exertional hypoxia. Steroid shot and neb in office. Advised to start Susan Burch and Susan [employer]urch . Resume Susan Burch .   10/03/2023: SHERLEAN with Susan Burch Discussed the use of AI scribe software for clinical note transcription with the patient, who gave verbal consent to proceed. Susan Burch is a 68 year old female with asthma who presents with a persistent cough and back pain. 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 She's missed 3 days of work as a result. She completed a course of omnicef  for seven days and two rounds of prednisone , including a short burst and a longer taper, without significant improvement. She's still finishing the taper. Chest x ray last week was clear.  She sometimes experiences an expiratory wheeze, mostly in the upper chest,  and notes that she is now producing some yellow phlegm. No recent fevers, facial tenderness, headaches, ear pain, or hemoptysis. Does have some postnasal drainage.  She has a history of allergies and asthma, for which she previously received allergy shots. In the past, when she became ill, she required antibiotics such as Susan Burch or Susan Burch . She is currently using a Susan Burch  inhaler twice a day and a rescue inhaler as needed, though the rescue inhaler provides only moderate relief. She also takes Susan Burch daily. She reports back pain associated with coughing, which she describes as painful. She is currently taking Susan  Burch twice a day. She is still tapering off prednisone , with about three or four days remaining, and is unsure if she had benefit with higher doses. She uses Susan Burch  for nasal symptoms and reports some nasal drainage.  She is concerned about the impact of her symptoms on her work, noting the fast-paced nature of her job and the potential for disciplinary action due to missed work. She is eager for relief from her symptoms. She is feeling more short winded with heavier exertion and coughing spells.  FeNO 5 ppb   10/25/2023: OV with Susan Burch Susan Burch is a 68 year old female with emphysema/asthma who presents for follow-up regarding her respiratory symptoms. After her last visit, she had CTA chest that was negative for PE or acute process.  She feels better with her symptoms returning to normal. No current breathing issues and her cough has resolved. She has  returned to work and is not using any cough syrups. She continues to use nasal rinses and uses her albuterol  inhaler three to four times a week, which is an improvement. She is still taking Susan Burch  twice a day. Feels this helps.  A rib fracture was identified on a CT scan, likely due to excessive coughing. She is no longer experiencing soreness from the fracture. She discusses her FMLA paperwork, noting corrections needed regarding the  duration and frequency of her condition. She is coordinating with her FMLA contact to ensure the paperwork is correctly completed and faxed back. She plans to retire in the spring and is considering her Medicare options.     02/28/2024: Today - follow up Discussed the use of AI scribe software for clinical note transcription with the patient, who gave verbal consent to proceed.  History of Present Illness Susan Burch is a 68 year old female with emphysema/asthma who presents for follow up.  She experienced a prolonged respiratory illness in May/June of this year, which required FMLA. She finally improved following Susan Burch  course. Since then, she feels well and has not experienced further issues. She continues to use the Susan Burch  inhaler and uses albuterol  only a couple of times a month. No current sinus issues, and she has not been using saline rinses or Susan Burch  nasal spray recently. No fevers, chills, hemoptysis. No cough or significant dyspnea.   Her past medical history includes a history of smoking, but she quit long ago. A CT scan from her last visit showed no concerning findings, and a very tiny nodule was noted to be stable from past scans.  She is currently working in labor and delivery at [employer] and plans to retire by April. She has received her flu shot. Got her RSV vaccine in 2023.    Allergies  Allergen Reactions   Bactrim    Otezla  [Apremilast ] Hives   Penicillins     Immunization History  Administered Date(s) Administered   Fluad Trivalent(High Dose 65+) 02/07/2023   Influenza, Quadrivalent, Recombinant, Inj, Pf 01/21/2019   Influenza-Unspecified 01/23/2014, 12/25/2014, 01/23/2018, 01/24/2020, 01/23/2021   PFIZER(Purple Top)SARS-COV-2 Vaccination 04/20/2019, 05/09/2019, 02/01/2020   PNEUMOCOCCAL CONJUGATE-20 03/29/2023   Pneumococcal Conjugate-13 11/28/2006   Pneumococcal Polysaccharide-23 02/14/2013   Td 07/03/2006   Tdap 07/22/2021   Zoster Recombinant(Shingrix)  01/21/2019, 06/19/2020    Past Medical History:  Diagnosis Date   Centrilobular emphysema (HCC) 12/11/2019   Mild noted on CT for lung cancer screen 2021; former long term smoker.   GERD without esophagitis    HLD (hyperlipidemia)    Hypothyroidism    Mixed hyperlipidemia 02/20/2019   Psoriasis    Sleep initiation dysfunction 09/27/2011    Tobacco History: Social History   Tobacco Use  Smoking Status Former   Current packs/day: 0.00   Average packs/day: 1 pack/day for 32.0 years (32.0 ttl pk-yrs)   Types: Cigarettes   Start date: 75   Quit date: 2006   Years since quitting: 19.8  Smokeless Tobacco Never   Counseling given: Not Answered   Outpatient Medications Prior to Visit  Medication Sig Dispense Refill   albuterol  (VENTOLIN  HFA) 108 (90 Base) MCG/ACT inhaler Inhale 2 puffs into the lungs every 6 (six) hours as needed.     budesonide -glycopyrrolate -formoterol  (Susan Burch  AEROSPHERE) 160-9-4.8 MCG/ACT AERO inhaler Inhale 2 puffs into the lungs in the morning and at bedtime. 10.7 g 5   esomeprazole  (NEXIUM ) 20 MG capsule Take 1 capsule (20 mg total) by mouth daily. 90 capsule 3  fluticasone  (Susan Burch ) 50 MCG/ACT nasal spray Place 1 spray into both nostrils daily. 16 g 2   furosemide  (LASIX ) 20 MG tablet Take 1 tablet (20 mg total) by mouth daily as needed for edema. Take with potassium 90 tablet 3   HYDROcodone  bit-homatropine (HYCODAN) 5-1.5 MG/5ML syrup Take 5 mLs by mouth every 6 (six) hours as needed for cough. 180 mL 0   levofloxacin  (Susan Burch ) 500 MG tablet Take 1 tablet (500 mg total) by mouth daily. 7 tablet 0   levothyroxine  (SYNTHROID ) 137 MCG tablet Take 1 tablet (137 mcg total) by mouth daily before breakfast. 45 tablet 2   Multiple Vitamin (MULTIVITAMIN) tablet Take 1 tablet by mouth daily.     potassium chloride  SA (KLOR-CON  M) 20 MEQ tablet Take 1 tablet (20 mEq total) by mouth daily as needed (leg swelling). Take with furosemide  90 tablet 3   rosuvastatin   (CRESTOR ) 10 MG tablet Take 1 tablet (10 mg total) by mouth daily. 90 tablet 3   sertraline  (ZOLOFT ) 100 MG tablet Take 1 tablet (100 mg total) by mouth at bedtime. 90 tablet 3   zolpidem  (AMBIEN ) 5 MG tablet Take 1 - 2 tablets (5 - 10 mg total) by mouth at bedtime as needed. 60 tablet 5   benzonatate  (TESSALON ) 100 MG capsule Take 1-2 capsules (100-200 mg total) by mouth every 8 (eight) hours as needed. (Patient not taking: Reported on 02/28/2024) 21 capsule 0   lidocaine  (LIDODERM ) 5 % Place 1 patch onto the skin daily. Remove & Discard patch within 12 hours or as directed by MD (Patient not taking: Reported on 02/28/2024) 30 patch 0   meloxicam  (MOBIC ) 15 MG tablet Take 1 tablet (15 mg total) by mouth daily. (Patient not taking: Reported on 02/28/2024) 30 tablet 0   mometasone  (ELOCON ) 0.1 % cream qd to bid 5 days a week to aa psoriasis prn flares (Patient not taking: Reported on 02/28/2024) 45 g 0   Naltrexone -buPROPion  HCl ER 8-90 MG TB12 Start 1 tablet every morning for 7 days, then 1 tablet twice daily for 7 days, then 2 tablets every morning and one in the evening (Patient not taking: Reported on 02/28/2024) 120 tablet 0   No facility-administered medications prior to visit.     Review of Systems: as above    Physical Exam:  BP 129/78   Pulse 72   Temp (!) 97.5 F (36.4 C) (Oral)   Ht 5' 2 (1.575 m) Comment: per patient  Wt 221 lb (100.2 kg)   SpO2 92%   BMI 40.42 kg/m   GEN: Pleasant, interactive, well-kempt; non-toxic and in no acute distress HEENT:  Normocephalic and atraumatic.PERRLA. Sclera white. Nasal turbinates pink, moist and patent bilaterally. No rhinorrhea present. Oropharynx pink and moist, without exudate or edema. No lesions, ulcerations, or postnasal drip.  NECK:  Supple w/ fair ROM. No lymphadenopathy.   CV: RRR, no m/r/g, no peripheral edema. Pulses intact, +2 bilaterally. No cyanosis, pallor or clubbing. PULMONARY:  Unlabored, regular breathing. Clear  bilaterally A&P w/o wheezes/rales/rhonchi. No accessory muscle use.  GI: BS present and normoactive. Soft, non-tender to palpation.  MSK: No erythema, warmth or tenderness. Cap refil <2 sec all extrem.  Neuro: A/Ox3. No focal deficits noted.   Skin: Warm, no lesions or rashe Psych: Normal affect and behavior. Judgement and thought content appropriate.     Lab Results:  CBC    Component Value Date/Time   WBC 6.0 08/15/2023 1112   RBC 4.45 08/15/2023 1112   HGB  14.6 08/15/2023 1112   HCT 43.4 08/15/2023 1112   PLT 197.0 08/15/2023 1112   MCV 97.4 08/15/2023 1112   MCH 32.3 09/26/2018 1140   MCHC 33.6 08/15/2023 1112   RDW 14.1 08/15/2023 1112   LYMPHSABS 1.0 08/15/2023 1112   MONOABS 0.6 08/15/2023 1112   EOSABS 0.1 08/15/2023 1112   BASOSABS 0.0 08/15/2023 1112    BMET    Component Value Date/Time   NA 138 10/03/2023 1052   K 4.4 10/03/2023 1052   CL 104 10/03/2023 1052   CO2 25 10/03/2023 1052   GLUCOSE 113 (H) 10/03/2023 1052   BUN 16 10/03/2023 1052   CREATININE 1.08 (H) 10/03/2023 1052   CALCIUM  8.9 10/03/2023 1052   GFRNONAA 51 (L) 09/26/2018 1140   GFRAA 59 (L) 09/26/2018 1140    BNP No results found for: BNP   Imaging:  No results found.   Administration History     None          Latest Ref Rng & Units 06/15/2017   10:54 AM  PFT Results  FVC-Pre L 3.28   FVC-Predicted Pre % 102   FVC-Post L 3.21   FVC-Predicted Post % 100   Pre FEV1/FVC % % 84   Post FEV1/FCV % % 82   FEV1-Pre L 2.76   FEV1-Predicted Pre % 111   FEV1-Post L 2.63   DLCO uncorrected ml/min/mmHg 21.40   DLCO UNC% % 93   DLCO corrected ml/min/mmHg 20.15   DLCO COR %Predicted % 87   DLVA Predicted % 87   TLC L 5.50   TLC % Predicted % 112   RV % Predicted % 102     Lab Results  Component Value Date   NITRICOXIDE <5 10/03/2023        Assessment & Plan:   Mild persistent asthma Asthma/emphysema. Prior slow to resolve exacerbation secondary to viral illness  with component of sinusitis contributing to symptoms May/June, treated with empiric [facility] , steroids and supportive care measures with resolution. Stable today. Compensated on current regimen. Continue current maintenance regimen. Action plan in place. Encouraged to remain active. UTD on vaccines.   Patient Instructions  Continue Susan Burch  2 puffs Twice daily. Brush tongue and rinse mouth afterwards Continue Albuterol  inhaler 2 puffs every 6 hours as needed for shortness of breath or wheezing. Notify if symptoms persist despite rescue inhaler/neb use.   CT chest June 2026 for yearly follow up  RSV vaccine up to date for now; keep an eye on updates in the future but at this time, it is recommended one single injection    Follow up in 6 months with Dr. Jude or Izetta Malachy PIETY. If symptoms do not improve or worsen, please contact office for sooner follow up or seek emergency care.    Centrilobular emphysema (HCC) See above  Allergic rhinitis No current symptoms. Advised to monitor during allergy season and resume Susan Burch  as needed.   Lung nodule Stable 3 mm nodule, considered benign. Not a candidate for lung cancer screening given she quit >15 years ago. 32 pack year history. Would recommend continued annual surveillance - repeat June 2026.    Advised if symptoms do not improve or worsen, to please contact office for sooner follow up or seek emergency care.   I spent 31 minutes of dedicated to the care of this patient on the date of this encounter to include pre-visit review of records, face-to-face time with the patient discussing conditions above, post visit ordering of testing, clinical documentation  with the electronic health record, making appropriate referrals as documented, and communicating necessary findings to members of the patients care team.  Comer LULLA Rouleau, Burch 02/28/2024  Pt aware and understands Burch's role.

## 2024-02-28 NOTE — Assessment & Plan Note (Signed)
 Stable 3 mm nodule, considered benign. Not a candidate for lung cancer screening given she quit >15 years ago. 32 pack year history. Would recommend continued annual surveillance - repeat June 2026.

## 2024-02-28 NOTE — Patient Instructions (Addendum)
 Continue Breztri  2 puffs Twice daily. Brush tongue and rinse mouth afterwards Continue Albuterol  inhaler 2 puffs every 6 hours as needed for shortness of breath or wheezing. Notify if symptoms persist despite rescue inhaler/neb use.   CT chest June 2026 for yearly follow up  RSV vaccine up to date for now; keep an eye on updates in the future but at this time, it is recommended one single injection    Follow up in 6 months with Dr. Jude or Izetta Malachy PIETY. If symptoms do not improve or worsen, please contact office for sooner follow up or seek emergency care.

## 2024-02-28 NOTE — Assessment & Plan Note (Addendum)
 Asthma/emphysema. Prior slow to resolve exacerbation secondary to viral illness with component of sinusitis contributing to symptoms May/June, treated with empiric levaquin , steroids and supportive care measures with resolution. Stable today. Compensated on current regimen. Continue current maintenance regimen. Action plan in place. Encouraged to remain active. UTD on vaccines.   Patient Instructions  Continue Breztri  2 puffs Twice daily. Brush tongue and rinse mouth afterwards Continue Albuterol  inhaler 2 puffs every 6 hours as needed for shortness of breath or wheezing. Notify if symptoms persist despite rescue inhaler/neb use.   CT chest June 2026 for yearly follow up  RSV vaccine up to date for now; keep an eye on updates in the future but at this time, it is recommended one single injection    Follow up in 6 months with Dr. Jude or Susan Burch. If symptoms do not improve or worsen, please contact office for sooner follow up or seek emergency care.

## 2024-02-28 NOTE — Assessment & Plan Note (Signed)
 No current symptoms. Advised to monitor during allergy season and resume flonase  as needed.

## 2024-02-28 NOTE — Assessment & Plan Note (Signed)
 See above

## 2024-03-12 ENCOUNTER — Other Ambulatory Visit (HOSPITAL_COMMUNITY): Payer: Self-pay

## 2024-03-13 ENCOUNTER — Other Ambulatory Visit (HOSPITAL_COMMUNITY): Payer: Self-pay

## 2024-03-19 NOTE — Progress Notes (Addendum)
 [name] [address] [place] [address] Phone: [phone] Subjective:   Susan Burch Susan Burch, am serving as a scribe for Dr. Arthea Claudene.  I'm seeing this patient by the request  of:  [name], MD  CC: Right ankle pain  YEP:Dlagzrupcz  12/14/2023 Injection in the ankle done today.  Tolerated the procedure well, discussed with patient about proper shoes, home exercises for strengthening of the lateral compartment of the ankle follow-up again in 8-12 weeks otherwise.  Known arthritic changes.      Update 03/20/2024 Susan Burch is a 68 y.o. female coming in with complaint of R ankle pain.  Known breakdown of the transverse arch of the foot as well as a peroneal cyst of the right ankle.  Patient states that her pain is worsening. Pain in medial longitudinal arch and into the top of tarsals. Pain worse after sitting for a while.   Also having B hip pain over GT. Outside of hips on both sides, severe, waking her up at night       Past Medical History:  Diagnosis Date   Centrilobular emphysema (HCC) 12/11/2019   Mild noted on CT for lung cancer screen 2021; former long term smoker.   GERD without esophagitis    HLD (hyperlipidemia)    Hypothyroidism    Mixed hyperlipidemia 02/20/2019   Psoriasis    Sleep initiation dysfunction 09/27/2011   Past Surgical History:  Procedure Laterality Date   BREAST EXCISIONAL BIOPSY Right 1990   CESAREAN SECTION  1994, 1996   CHOLECYSTECTOMY  1998   MYOMECTOMY  1993   Social History   Socioeconomic History   Marital status: Married    Spouse name: Not on file   Number of children: Not on file   Years of education: Not on file   Highest education level: Not on file  Occupational History   Occupation: [employer]    Employer: [employer]  Tobacco Use   Smoking status: Former    Current packs/day: 0.00    Average packs/day: 1 pack/day for 32.0 years (32.0 ttl pk-yrs)    Types: Cigarettes    Start  date: 51    Quit date: 2006    Years since quitting: 19.9   Smokeless tobacco: Never  Vaping Use   Vaping status: Never Used  Substance and Sexual Activity   Alcohol use: Yes    Alcohol/week: 14.0 standard drinks of alcohol    Types: 14 Glasses of wine per week    Comment: 2 glasses per day   Drug use: Never   Sexual activity: Not on file  Other Topics Concern   Not on file  Social History Narrative   Not on file   Social Drivers of Health   Financial Resource Strain: Not on file  Food Insecurity: Not on file  Transportation Needs: Not on file  Physical Activity: Not on file  Stress: Not on file  Social Connections: Not on file   Allergies  Allergen Reactions   Bactrim    Otezla  [Apremilast ] Hives   Penicillins    Family History  Problem Relation Age of Onset   Heart Problems Mother    Hypothyroidism Mother    High blood pressure Father    High Cholesterol Father    Diabetes Brother    Diabetes Maternal Grandmother    High blood pressure Maternal Uncle    Atrial fibrillation Maternal Uncle    Hypothyroidism Maternal Uncle    Heart  Problems Maternal Aunt    Cancer Maternal Aunt    Hypothyroidism Maternal Aunt    Cancer Cousin    Breast cancer Cousin    Hypothyroidism Daughter    Hypothyroidism Son     Current Outpatient Medications (Endocrine & Metabolic):    levothyroxine  (SYNTHROID ) 137 MCG tablet, Take 1 tablet (137 mcg total) by mouth daily before breakfast.  Current Outpatient Medications (Cardiovascular):    furosemide  (LASIX ) 20 MG tablet, Take 1 tablet (20 mg total) by mouth daily as needed for edema. Take with potassium   rosuvastatin  (CRESTOR ) 10 MG tablet, Take 1 tablet (10 mg total) by mouth daily.  Current Outpatient Medications (Respiratory):    albuterol  (VENTOLIN  HFA) 108 (90 Base) MCG/ACT inhaler, Inhale 2 puffs into the lungs every 6 (six) hours as needed.   benzonatate  (TESSALON ) 100 MG capsule, Take 1-2 capsules (100-200 mg total) by  mouth every 8 (eight) hours as needed.   budesonide -glycopyrrolate -formoterol  (BREZTRI  AEROSPHERE) 160-9-4.8 MCG/ACT AERO inhaler, Inhale 2 puffs into the lungs in the morning and at bedtime.   fluticasone  (FLONASE ) 50 MCG/ACT nasal spray, Place 1 spray into both nostrils daily.   HYDROcodone  bit-homatropine (HYCODAN) 5-1.5 MG/5ML syrup, Take 5 mLs by mouth every 6 (six) hours as needed for cough.  Current Outpatient Medications (Analgesics):    meloxicam  (MOBIC ) 15 MG tablet, Take 1 tablet (15 mg total) by mouth daily.   Current Outpatient Medications (Other):    esomeprazole  (NEXIUM ) 20 MG capsule, Take 1 capsule (20 mg total) by mouth daily.   levofloxacin  (LEVAQUIN ) 500 MG tablet, Take 1 tablet (500 mg total) by mouth daily.   lidocaine  (LIDODERM ) 5 %, Place 1 patch onto the skin daily. Remove & Discard patch within 12 hours or as directed by MD   mometasone  (ELOCON ) 0.1 % cream, qd to bid 5 days a week to aa psoriasis prn flares   Multiple Vitamin (MULTIVITAMIN) tablet, Take 1 tablet by mouth daily.   Naltrexone -buPROPion  HCl ER 8-90 MG TB12, Start 1 tablet every morning for 7 days, then 1 tablet twice daily for 7 days, then 2 tablets every morning and one in the evening   potassium chloride  SA (KLOR-CON  M) 20 MEQ tablet, Take 1 tablet (20 mEq total) by mouth daily as needed (leg swelling). Take with furosemide    sertraline  (ZOLOFT ) 100 MG tablet, Take 1 tablet (100 mg total) by mouth at bedtime.   zolpidem  (AMBIEN ) 5 MG tablet, Take 1 - 2 tablets (5 - 10 mg total) by mouth at bedtime as needed.   Reviewed prior external information including notes and imaging from  primary care provider As well as notes that were available from care everywhere and other healthcare systems.  Past medical history, social, surgical and family history all reviewed in electronic medical record.  No pertanent information unless stated regarding to the chief complaint.   Review of Systems:  No headache,  visual changes, nausea, vomiting, diarrhea, constipation, dizziness, abdominal pain, skin rash, fevers, chills, night sweats, weight loss, swollen lymph nodes, body aches, joint swelling, chest pain, shortness of breath, mood changes. POSITIVE muscle aches  Objective  Blood pressure 108/62, pulse 70, height 5' 2 (1.575 m), weight 224 lb (101.6 kg), SpO2 90%.   General: No apparent distress alert and oriented x3 mood and affect normal, dressed appropriately.  HEENT: Pupils equal, extraocular movements intact  Respiratory: Patient's speak in full sentences and does not appear short of breath  Cardiovascular: No lower extremity edema, non tender, no erythema  Ankle exam shows patient does have breakdown of the transverse arch noted.  Limited range of motion especially with the right sided first MTP.  Patient's is significantly smaller than what it was previously. Severe tenderness to palpation over the greater trochanteric area bilaterally right greater than left.  Procedure: Real-time Ultrasound Guided Injection of right MTP joint Device: GE Logiq Q7 Ultrasound guided injection is preferred based studies that show increased duration, increased effect, greater accuracy, decreased procedural pain, increased response rate, and decreased cost with ultrasound guided versus blind injection.  Verbal informed consent obtained.  Time-out conducted.  Noted no overlying erythema, induration, or other signs of local infection.  Skin prepped in a sterile fashion.  Local anesthesia: Topical Ethyl chloride.  With sterile technique and under real time ultrasound guidance: With a 25-gauge half inch needle injected with 0.5 cc of 0.5% Marcaine and 0.5 cc of Kenalog  40 mg/mL.  No blood loss.  Band-Aid placed.  Postinjection instruction given. Completed without difficulty  Pain immediately resolved suggesting accurate placement of the medication.  Advised to call if fevers/chills, erythema, induration, drainage,  or persistent bleeding.  Impression: Technically successful ultrasound guided injection.  Procedure: Real-time Ultrasound Guided Injection of right greater trochanteric bursitis secondary to patient's body habitus Device: GE Logiq Q7 Ultrasound guided injection is preferred based studies that show increased duration, increased effect, greater accuracy, decreased procedural pain, increased response rate, and decreased cost with ultrasound guided versus blind injection.  Verbal informed consent obtained.  Time-out conducted.  Noted no overlying erythema, induration, or other signs of local infection.  Skin prepped in a sterile fashion.  Local anesthesia: Topical Ethyl chloride.  With sterile technique and under real time ultrasound guidance:  Greater trochanteric area was visualized and patient's bursa was noted. A 22-gauge 3 inch needle was inserted and 4 cc of 0.5% Marcaine and 1 cc of Kenalog  40 mg/dL was injected. Pictures taken Completed without difficulty  Pain immediately resolved suggesting accurate placement of the medication.  Advised to call if fevers/chills, erythema, induration, drainage, or persistent bleeding.  Images permanently stored  Impression: Technically successful ultrasound guided injection.   Procedure: Real-time Ultrasound Guided Injection of left  greater trochanteric bursitis secondary to patient's body habitus Device: GE Logiq Q7  Ultrasound guided injection is preferred based studies that show increased duration, increased effect, greater accuracy, decreased procedural pain, increased response rate, and decreased cost with ultrasound guided versus blind injection.  Verbal informed consent obtained.  Time-out conducted.  Noted no overlying erythema, induration, or other signs of local infection.  Skin prepped in a sterile fashion.  Local anesthesia: Topical Ethyl chloride.  With sterile technique and under real time ultrasound guidance:  Greater trochanteric area  was visualized and patient's bursa was noted. A 22-gauge 3 inch needle was inserted and 4 cc of 0.5% Marcaine and 1 cc of Kenalog  40 mg/dL was injected. Pictures taken Completed without difficulty  Pain immediately resolved suggesting accurate placement of the medication.  Advised to call if fevers/chills, erythema, induration, drainage, or persistent bleeding.  Images permanently stored .  Impression: Technically successful ultrasound guided injection.     Impression and Recommendations:     The above documentation has been reviewed and is accurate and complete Niles Ess M Aleza Pew, DO

## 2024-03-20 ENCOUNTER — Other Ambulatory Visit: Payer: Self-pay

## 2024-03-20 ENCOUNTER — Ambulatory Visit: Admitting: Family Medicine

## 2024-03-20 ENCOUNTER — Encounter: Payer: Self-pay | Admitting: Family Medicine

## 2024-03-20 VITALS — BP 108/62 | HR 70 | Ht 62.0 in | Wt 224.0 lb

## 2024-03-20 DIAGNOSIS — M19071 Primary osteoarthritis, right ankle and foot: Secondary | ICD-10-CM

## 2024-03-20 DIAGNOSIS — M25571 Pain in right ankle and joints of right foot: Secondary | ICD-10-CM

## 2024-03-20 DIAGNOSIS — M7061 Trochanteric bursitis, right hip: Secondary | ICD-10-CM

## 2024-03-20 NOTE — Patient Instructions (Addendum)
 Injection in hip and foot today Good to see you! See you again in 2-3 months

## 2024-03-20 NOTE — Assessment & Plan Note (Signed)
 Patient given injection and tolerated the procedure well, discussed icing regimen and home exercises, discussed which activities to do and which ones to avoid.  Increase activity slowly.  Discussed icing regimen.  Follow-up again in 6 to 12 weeks

## 2024-03-22 ENCOUNTER — Other Ambulatory Visit (HOSPITAL_COMMUNITY): Payer: Self-pay

## 2024-03-23 ENCOUNTER — Other Ambulatory Visit: Payer: Self-pay | Admitting: Internal Medicine

## 2024-03-23 ENCOUNTER — Other Ambulatory Visit (HOSPITAL_COMMUNITY): Payer: Self-pay

## 2024-03-25 ENCOUNTER — Other Ambulatory Visit (HOSPITAL_COMMUNITY): Payer: Self-pay

## 2024-03-25 ENCOUNTER — Other Ambulatory Visit: Payer: Self-pay

## 2024-03-25 MED ORDER — BREZTRI AEROSPHERE 160-9-4.8 MCG/ACT IN AERO
2.0000 | INHALATION_SPRAY | Freq: Two times a day (BID) | RESPIRATORY_TRACT | 5 refills | Status: AC
  Filled 2024-03-25: qty 10.7, 30d supply, fill #0
  Filled 2024-04-22: qty 10.7, 30d supply, fill #1
  Filled 2024-05-22: qty 10.7, 30d supply, fill #2
  Filled 2024-05-23: qty 32.1, 90d supply, fill #2

## 2024-03-27 ENCOUNTER — Other Ambulatory Visit (HOSPITAL_COMMUNITY): Payer: Self-pay

## 2024-03-29 ENCOUNTER — Other Ambulatory Visit (HOSPITAL_COMMUNITY): Payer: Self-pay

## 2024-03-31 ENCOUNTER — Other Ambulatory Visit: Payer: Self-pay | Admitting: Internal Medicine

## 2024-04-01 ENCOUNTER — Other Ambulatory Visit (HOSPITAL_COMMUNITY): Payer: Self-pay

## 2024-04-01 ENCOUNTER — Other Ambulatory Visit: Payer: Self-pay

## 2024-04-01 MED ORDER — LEVOTHYROXINE SODIUM 137 MCG PO TABS
125.0000 ug | ORAL_TABLET | Freq: Every day | ORAL | 0 refills | Status: DC
  Filled 2024-04-01: qty 30, 30d supply, fill #0

## 2024-04-05 NOTE — Assessment & Plan Note (Signed)
 A.  Procedure, discussed home exercise, has been a year since injections have been done.  Hopeful that this will be beneficial.  Differential includes lumbar radiculopathy.  Follow-up again in 6 to 12 weeks

## 2024-04-10 ENCOUNTER — Other Ambulatory Visit: Payer: Self-pay | Admitting: Family Medicine

## 2024-04-10 ENCOUNTER — Other Ambulatory Visit (HOSPITAL_COMMUNITY): Payer: Self-pay

## 2024-04-10 DIAGNOSIS — G4700 Insomnia, unspecified: Secondary | ICD-10-CM

## 2024-04-10 MED ORDER — ZOLPIDEM TARTRATE 5 MG PO TABS
5.0000 mg | ORAL_TABLET | Freq: Every evening | ORAL | 5 refills | Status: AC | PRN
  Filled 2024-04-10: qty 60, 30d supply, fill #0
  Filled 2024-05-10: qty 60, 30d supply, fill #1

## 2024-04-10 NOTE — Telephone Encounter (Signed)
 Last OV: 08/15/2023  Next OV: ?  Last Refill: 10/09/2023  Dispense: 60/5

## 2024-04-11 ENCOUNTER — Other Ambulatory Visit (HOSPITAL_COMMUNITY): Payer: Self-pay

## 2024-04-18 ENCOUNTER — Other Ambulatory Visit (HOSPITAL_COMMUNITY): Payer: Self-pay

## 2024-04-21 ENCOUNTER — Other Ambulatory Visit: Payer: Self-pay | Admitting: Nurse Practitioner

## 2024-04-21 ENCOUNTER — Other Ambulatory Visit (HOSPITAL_COMMUNITY): Payer: Self-pay

## 2024-04-21 DIAGNOSIS — J329 Chronic sinusitis, unspecified: Secondary | ICD-10-CM

## 2024-04-22 ENCOUNTER — Other Ambulatory Visit: Payer: Self-pay

## 2024-04-22 ENCOUNTER — Encounter (HOSPITAL_BASED_OUTPATIENT_CLINIC_OR_DEPARTMENT_OTHER): Payer: Self-pay | Admitting: Pulmonary Disease

## 2024-04-23 NOTE — Telephone Encounter (Signed)
 Please advise

## 2024-04-23 NOTE — Telephone Encounter (Signed)
 Needs an appointment.

## 2024-04-26 ENCOUNTER — Ambulatory Visit: Payer: Self-pay | Admitting: Pulmonary Disease

## 2024-04-26 NOTE — Telephone Encounter (Signed)
 [name] Triage - Initial Assessment Questions Chief Complaint (e.g., cough, sob, wheezing, fever, chills, sweat or additional symptoms) *Go to specific symptom protocol after initial questions. COUGH  How long have symptoms been present? 5 days  Have you tested for COVID or Flu? Note: If not, ask patient if Burch home test can be taken. If so, instruct patient to call back for positive results.   MEDICINES:   Have you used any OTC meds to help with symptoms?  If yes, ask What medications?   Have you used your inhalers/maintenance medication? Yes If yes, What medications? Albuterol , brestri  If inhaler, ask How many puffs and how often? Note: Review instructions on medication in the chart. Albuterol  qid  OXYGEN: Do you wear supplemental oxygen? No If yes, How many liters are you supposed to use?   Do you monitor your oxygen levels? No If yes, What is your reading (oxygen level) today?   What is your usual oxygen saturation reading?  (Note: Pulmonary O2 sats should be 90% or greater)  sunday   Copied from CRM #[id number]. Topic: Clinical - Red Word Triage >> Apr 26, 2024  9:36 AM Susan Burch wrote: Red Word that prompted transfer to Nurse Triage:  Patient has an upper respiratory - constantly coughing, using rescue inhaler. Requesting medication. - Experiencing SOB & wheezing Reason for Disposition  [1] Wheezing or coughing AND [2] hasn't used neb or inhaler (up to 3 treatments given 20 minutes apart) AND [3] it's available  Protocols used: Asthma Attack-Burch-AH

## 2024-04-26 NOTE — Telephone Encounter (Addendum)
 Attempted x 2 to call patient.  Phone rings, then disconnects when patient answers.  Will try call again at later date.   Called patient.  Patient has scheduled OV with Dr. Theophilus on 04/30/2024 at 3:45 pm.

## 2024-04-27 ENCOUNTER — Other Ambulatory Visit: Payer: Self-pay | Admitting: Internal Medicine

## 2024-04-29 ENCOUNTER — Other Ambulatory Visit (HOSPITAL_COMMUNITY): Payer: Self-pay

## 2024-04-29 MED ORDER — LEVOTHYROXINE SODIUM 137 MCG PO TABS
125.0000 ug | ORAL_TABLET | Freq: Every day | ORAL | 0 refills | Status: DC
  Filled 2024-04-29: qty 30, 30d supply, fill #0

## 2024-04-30 ENCOUNTER — Ambulatory Visit: Admitting: Pulmonary Disease

## 2024-04-30 ENCOUNTER — Other Ambulatory Visit (HOSPITAL_COMMUNITY): Payer: Self-pay

## 2024-04-30 ENCOUNTER — Encounter: Payer: Self-pay | Admitting: Pulmonary Disease

## 2024-04-30 VITALS — BP 123/70 | HR 73 | Temp 98.0°F | Ht 62.0 in | Wt 225.0 lb

## 2024-04-30 DIAGNOSIS — J441 Chronic obstructive pulmonary disease with (acute) exacerbation: Secondary | ICD-10-CM

## 2024-04-30 DIAGNOSIS — J069 Acute upper respiratory infection, unspecified: Secondary | ICD-10-CM

## 2024-04-30 DIAGNOSIS — J4489 Other specified chronic obstructive pulmonary disease: Secondary | ICD-10-CM | POA: Diagnosis not present

## 2024-04-30 DIAGNOSIS — J4541 Moderate persistent asthma with (acute) exacerbation: Secondary | ICD-10-CM

## 2024-04-30 LAB — POCT INFLUENZA A/B
Influenza A, POC: NEGATIVE
Influenza B, POC: NEGATIVE

## 2024-04-30 LAB — POC COVID19 BINAXNOW: SARS Coronavirus 2 Ag: NEGATIVE

## 2024-04-30 MED ORDER — LEVOFLOXACIN 500 MG PO TABS
500.0000 mg | ORAL_TABLET | Freq: Every day | ORAL | 0 refills | Status: DC
  Filled 2024-04-30 (×2): qty 7, 7d supply, fill #0

## 2024-04-30 MED ORDER — PREDNISONE 10 MG PO TABS
40.0000 mg | ORAL_TABLET | Freq: Every day | ORAL | 0 refills | Status: AC
  Filled 2024-04-30 (×2): qty 20, 5d supply, fill #0

## 2024-04-30 NOTE — Progress Notes (Signed)
 [address]              Susan Burch    [id number]    06/16/1955  Primary Care Physician:[name], [name], MD  Referring Physician: [name], MD [address] [place],  [place] [address]  Chief complaint: Acute visit for dyspnea, COPD asthma exacerbation  HPI: 69 y.o. who  has a past medical history of Centrilobular emphysema (HCC) (12/11/2019), GERD without esophagitis, HLD (hyperlipidemia), Hypothyroidism, Mixed hyperlipidemia (02/20/2019), Psoriasis, and Sleep initiation dysfunction (09/27/2011).  Discussed the use of AI scribe software for clinical note transcription with the patient, who gave verbal consent to proceed.  History of Present Illness Susan Burch is a 69 year old female with asthma and COPD overlap syndrome who presents with a persistent cough and respiratory symptoms.  She is a patient of Dr. Jude  Respiratory symptoms - Onset December 28 - Severe productive cough, persistent - Congestion - Shortness of breath - No fevers or chills - Family members with similar respiratory symptoms  Upper respiratory symptoms - Headache, persistent - Sinus symptoms - Intermittent epistaxis  Symptom trajectory - Some improvement compared to last week - Cough and headache remain persistent  Current and prior treatments - Uses Breztri  and albuterol  inhalers, no recent changes - Taking Mucinex  and Advil for symptom relief - For prior similar illnesses, improved with Levaquin  and prednisone  40 mg daily for 5 days - Azithromycin  previously ineffective  Outpatient Encounter Medications as of 04/30/2024  Medication Sig   albuterol  (VENTOLIN  HFA) 108 (90 Base) MCG/ACT inhaler Inhale 2 puffs into the lungs every 6 (six) hours as needed.   benzonatate  (TESSALON ) 100 MG capsule Take 1-2 capsules (100-200 mg total) by mouth every 8 (eight) hours as needed.   budesonide -glycopyrrolate -formoterol  (BREZTRI  AEROSPHERE) 160-9-4.8 MCG/ACT AERO inhaler Inhale 2 puffs into the lungs in the  morning and at bedtime.   esomeprazole  (NEXIUM ) 20 MG capsule Take 1 capsule (20 mg total) by mouth daily.   fluticasone  (FLONASE ) 50 MCG/ACT nasal spray Place 1 spray into both nostrils daily.   furosemide  (LASIX ) 20 MG tablet Take 1 tablet (20 mg total) by mouth daily as needed for edema. Take with potassium   HYDROcodone  bit-homatropine (HYCODAN) 5-1.5 MG/5ML syrup Take 5 mLs by mouth every 6 (six) hours as needed for cough.   levothyroxine  (SYNTHROID ) 137 MCG tablet Take 1 tablet (137 mcg total) by mouth daily before breakfast.   lidocaine  (LIDODERM ) 5 % Place 1 patch onto the skin daily. Remove & Discard patch within 12 hours or as directed by MD   Multiple Vitamin (MULTIVITAMIN) tablet Take 1 tablet by mouth daily.   potassium chloride  SA (KLOR-CON  M) 20 MEQ tablet Take 1 tablet (20 mEq total) by mouth daily as needed (leg swelling). Take with furosemide    rosuvastatin  (CRESTOR ) 10 MG tablet Take 1 tablet (10 mg total) by mouth daily.   sertraline  (ZOLOFT ) 100 MG tablet Take 1 tablet (100 mg total) by mouth at bedtime.   zolpidem  (AMBIEN ) 5 MG tablet Take 1 - 2 tablets (5 - 10 mg total) by mouth at bedtime as needed.   levofloxacin  (LEVAQUIN ) 500 MG tablet Take 1 tablet (500 mg total) by mouth daily. (Patient not taking: Reported on 04/30/2024)   meloxicam  (MOBIC ) 15 MG tablet Take 1 tablet (15 mg total) by mouth daily. (Patient not taking: Reported on 04/30/2024)   mometasone  (ELOCON ) 0.1 % cream qd to bid 5 days a week to aa psoriasis prn flares (Patient not taking:  Reported on 04/30/2024)   Naltrexone -buPROPion  HCl ER 8-90 MG TB12 Start 1 tablet every morning for 7 days, then 1 tablet twice daily for 7 days, then 2 tablets every morning and one in the evening   No facility-administered encounter medications on file as of 04/30/2024.     Physical Exam: Today's Vitals   04/30/24 1539  BP: 123/70  Pulse: 73  Temp: 98 F (36.7 C)  TempSrc: Oral  SpO2: 93%  Weight: 225 lb (102.1 kg)   Height: 5' 2 (1.575 m)   Body mass index is 41.15 kg/m.  Physical Exam GEN: No acute distress. CV: Regular rate and rhythm, no murmurs. LUNGS: Clear to auscultation bilaterally, normal respiratory effort, no major wheezing. SKIN JOINTS: Warm and dry, no rash.  Data Reviewed: Imaging: CTA 10/03/2023-no pulmonary embolism.  Posterior dependent atelectasis.  3 mm pulmonary nodule. I have reviewed the images personally.  PFTs:  Labs: COVID/flu POC 04/30/2024- negative Assessment & Plan Acute upper respiratory infection Symptoms include productive cough, congestion, headache, and epistaxis. Differential diagnosis includes COVID-19 and influenza. Azithromycin  is ineffective for her; Levaquin  is preferred. - Tested negative for COVID-19 and influenza - Prescribed Levaquin  as this is the only antibiotic that works per patient - Prescribed prednisone  40 mg for 5 days  Asthma-COPD overlap syndrome Managed with Breztri  inhaler and albuterol . No major wheezing on examination. Symptoms include cough and shortness of breath, likely exacerbated by the current upper respiratory infection. - Continue Breztri  inhaler and albuterol   Recommendations: Levaquin  Prednisone  40 mg/day for 5 days  Lonna Coder MD [facility] 04/30/2024, 4:26 PM  CC: [name], MD   "

## 2024-04-30 NOTE — Patient Instructions (Addendum)
" °  VISIT SUMMARY: You visited us  today due to a persistent cough and respiratory symptoms that started on December 28. You have asthma and COPD overlap syndrome, and your symptoms include a productive cough, congestion, headache, and shortness of breath. We have tested you for COVID-19 and influenza and provided medications to help manage your symptoms.  YOUR PLAN: ACUTE UPPER RESPIRATORY INFECTION: You have a productive cough, congestion, headache, and occasional nosebleeds. -We tested you for COVID-19 and influenza and the tests are negative -Start taking Levaquin  as prescribed. -Take prednisone  40 mg daily for 5 days.  ASTHMA-COPD OVERLAP SYNDROME: Your asthma and COPD are managed with Breztri  and albuterol  inhalers. Your current symptoms are likely worsened by the upper respiratory infection. -Continue using your Breztri  and albuterol  inhalers as prescribed.                      Contains text generated by [employer]bridge.                                 Contains text generated by [employer]bridge.   "

## 2024-05-01 ENCOUNTER — Other Ambulatory Visit: Payer: Self-pay

## 2024-05-03 ENCOUNTER — Ambulatory Visit: Payer: Self-pay | Admitting: Family Medicine

## 2024-05-03 ENCOUNTER — Other Ambulatory Visit (HOSPITAL_COMMUNITY): Payer: Self-pay

## 2024-05-03 ENCOUNTER — Other Ambulatory Visit: Payer: Self-pay

## 2024-05-03 ENCOUNTER — Other Ambulatory Visit: Payer: Self-pay | Admitting: Family Medicine

## 2024-05-03 MED ORDER — ALBUTEROL SULFATE HFA 108 (90 BASE) MCG/ACT IN AERS
2.0000 | INHALATION_SPRAY | Freq: Four times a day (QID) | RESPIRATORY_TRACT | 3 refills | Status: AC | PRN
  Filled 2024-05-03 (×2): qty 6.7, 25d supply, fill #0

## 2024-05-03 NOTE — Telephone Encounter (Signed)
 FYI Only or Action Required?: Action required by provider: medication refill request.  Patient was last seen in primary care on 08/15/2023 by [name], MD.  Called Nurse Triage reporting No chief complaint on file..  Symptoms began several weeks ago.  Interventions attempted: Prescription medications: Prednisone , albuterol .  Symptoms are: gradually improving.  Triage Disposition: See PCP Within 2 Weeks  Patient/caregiver understands and will follow disposition?: Yes  Copied from CRM [id number]. Topic: Clinical - Red Word Triage >> May 03, 2024 10:02 AM Porter L wrote: Red Word that prompted transfer to Nurse Triage: shortness of breath , and cough Reason for Disposition  [1] MILD longstanding difficulty breathing (e.g., minimal/no SOB at rest, SOB with walking, pulse < 100) AND [2] SAME as normal  Answer Assessment - Initial Assessment Questions 1. RESPIRATORY STATUS: Describe your breathing? (e.g., wheezing, shortness of breath, unable to speak, severe coughing)      Short of breath when up doing this.  2. ONSET: When did this breathing problem begin?      04/21/2025 3. PATTERN Does the difficult breathing come and go, or has it been constant since it started?      Comes and goes 4. SEVERITY: How bad is your breathing? (e.g., mild, moderate, severe)      Breathing fine now but is complete 7. LUNG HISTORY: Do you have any history of lung disease?  (e.g., pulmonary embolus, asthma, emphysema)     Asthma 9. OTHER SYMPTOMS: Do you have any other symptoms? (e.g., chest pain, cough, dizziness, fever, runny nose)     Productive cough on prednisone  and antibiotic, clear  Protocols used: Breathing Difficulty-A-AH

## 2024-05-03 NOTE — Telephone Encounter (Signed)
 Copied from CRM #[id number]. Topic: Clinical - Medication Refill >> May 03, 2024 10:00 AM Jayma L wrote: Medication:  albuterol  (VENTOLIN  HFA) 108 (90 Base) MCG/ACT inhaler Has the patient contacted their pharmacy? Yes (Agent: If no, request that the patient contact the pharmacy for the refill. If patient does not wish to contact the pharmacy document the reason why and proceed with request.) (Agent: If yes, when and what did the pharmacy advise?)  This is the patient's preferred pharmacy:  [facility] [address] [place] [address] Phone: [phone] Fax: [phone]  Is this the correct pharmacy for this prescription? Yes If no, delete pharmacy and type the correct one.   Has the prescription been filled recently? No  Is the patient out of the medication? Yes  Has the patient been seen for an appointment in the last year OR does the patient have an upcoming appointment? Yes  Can we respond through MyChart? Yes  Agent: Please be advised that Rx refills may take up to 3 business days. We ask that you follow-up with your pharmacy.

## 2024-05-06 ENCOUNTER — Other Ambulatory Visit: Payer: Self-pay

## 2024-05-10 ENCOUNTER — Other Ambulatory Visit (HOSPITAL_COMMUNITY): Payer: Self-pay

## 2024-05-13 ENCOUNTER — Other Ambulatory Visit: Payer: Self-pay

## 2024-05-13 ENCOUNTER — Other Ambulatory Visit (HOSPITAL_COMMUNITY): Payer: Self-pay

## 2024-05-13 MED ORDER — ALBUTEROL SULFATE HFA 108 (90 BASE) MCG/ACT IN AERS
2.0000 | INHALATION_SPRAY | Freq: Four times a day (QID) | RESPIRATORY_TRACT | 5 refills | Status: AC | PRN
  Filled 2024-05-13: qty 6.7, 25d supply, fill #0
  Filled 2024-05-27: qty 20.1, 84d supply, fill #0

## 2024-05-22 ENCOUNTER — Other Ambulatory Visit (HOSPITAL_COMMUNITY): Payer: Self-pay

## 2024-05-22 ENCOUNTER — Telehealth (HOSPITAL_COMMUNITY): Payer: Self-pay

## 2024-05-22 ENCOUNTER — Other Ambulatory Visit: Payer: Self-pay

## 2024-05-22 ENCOUNTER — Ambulatory Visit: Payer: Commercial Managed Care - PPO | Admitting: Internal Medicine

## 2024-05-23 ENCOUNTER — Other Ambulatory Visit (HOSPITAL_COMMUNITY): Payer: Self-pay

## 2024-05-23 ENCOUNTER — Other Ambulatory Visit: Payer: Self-pay

## 2024-05-27 ENCOUNTER — Other Ambulatory Visit (HOSPITAL_COMMUNITY): Payer: Self-pay

## 2024-05-27 ENCOUNTER — Other Ambulatory Visit: Payer: Self-pay

## 2024-05-30 ENCOUNTER — Other Ambulatory Visit

## 2024-05-30 ENCOUNTER — Encounter: Payer: Self-pay | Admitting: Internal Medicine

## 2024-05-30 ENCOUNTER — Ambulatory Visit: Admitting: Internal Medicine

## 2024-05-30 VITALS — BP 138/80 | HR 74 | Ht 62.0 in | Wt 222.8 lb

## 2024-05-30 DIAGNOSIS — E039 Hypothyroidism, unspecified: Secondary | ICD-10-CM

## 2024-05-30 NOTE — Patient Instructions (Signed)
 Please stop at the lab.  Please continue Levothyroxine  137 mcg daily.  Take the thyroid  hormone every day, with water, at least 30 minutes before breakfast, separated by at least 4 hours from: - acid reflux medications - calcium  - iron - multivitamins  Please come back for a follow-up appointment in 1 year, but for labs in 6 months.

## 2024-05-30 NOTE — Addendum Note (Signed)
 Addended by: CLAUDENE NEVINS A on: 05/30/2024 02:30 PM   Modules accepted: Orders

## 2024-05-30 NOTE — Progress Notes (Signed)
 Patient ID: Susan Burch, female   DOB: 06/08/1955, 69 y.o.   MRN: [id number]   HPI  Susan Burch is a 69 y.o.-year-old female, returning for follow-up for uncontrolled, acquired, hypothyroidism.  Last visit 1 year ago.  Interim history: She feels well at this visit, without complaints except left ankle pain after she fell last week (missed a step).  She also has hip pain.  She is seeing Dr. Genia. She was able to lose a significant amount of weight in usually on Wegovy , but unfortunately gained back 32 pounds afterwards after her insurance stopped covering it. She only gained 3 lbs since last OV. She had a URI at the end of 03/2024 >> was on ABx and steroids >> resolved.  Reviewed history: Pt. has been dx with hypothyroidism in 1997-1998 after the birth of her son.  She was started on Synthroid  then.  When I first saw her, she was on 175 mcg daily, however, she was not taking the medication correctly.  We were able to decrease the dose after she started to take it correctly.  She switched from Synthroid  d.a.w. to generic levothyroxine  since last visit (prescription sent by PCP).  She does not feel a difference.  Pt is on levothyroxine  137 mcg daily (dose increased 04/2023, then 06/2023, then 10/2023), taken: - in am - fasting - at least 30 min from b'fast and coffee + creamer - no Ca, Fe - + MVI at night, PPIs (Nexium ) at lunchtime - + on Biotin (B complex) 300 mcg - last dose last night  Reviewed her TFTs: Lab Results  Component Value Date   TSH 1.03 01/03/2024   TSH 7.72 (H) 11/22/2023   TSH 10.29 (H) 07/05/2023   TSH 23.97 (H) 05/25/2023   TSH 1.25 10/05/2022   TSH 1.78 07/27/2022   TSH 0.25 (L) 03/30/2022   TSH 0.11 (L) 01/27/2022   TSH 1.31 05/06/2021   TSH 0.10 (L) 03/25/2021   FREET4 1.6 01/03/2024   FREET4 1.2 11/22/2023   FREET4 1.3 07/05/2023   FREET4 1.1 05/25/2023   FREET4 0.75 10/05/2022   FREET4 1.07 03/30/2022   FREET4 1.02 05/06/2021   FREET4 1.16 03/25/2021    FREET4 1.01 05/20/2020   FREET4 0.84 04/02/2020   T3FREE 3.5 05/30/2017   Pt denies: - feeling nodules in neck - hoarseness - choking She had occasional dysphagia due to GERD >> resolved on Nexium .  She has + FH of thyroid  disorders in: M and M aunt and uncle. Also all her children - hypothyroidism, M aunt. No FH of thyroid  cancer. No h/o radiation tx to head or neck. No herbal supplements. No Biotin use. On vitamin C, Zn, vitamin D 2000 units daily.  She was previously getting steroid injections for psoriasis of scalp.  Pt. also has a history of asthma with cough.  She gets as needed steroids. She also has polycythemia. In 2021 she was diagnosed with mild emphysema on a chest CT.   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  ROS: + see HPI  I reviewed pt's medications, allergies, PMH, social hx, family hx, and changes were documented in the history of present illness. Otherwise, unchanged from my initial visit note.  Past Medical History:  Diagnosis Date   Centrilobular emphysema (HCC) 12/11/2019   Mild noted on CT for lung cancer screen 2021; former long term smoker.   GERD without esophagitis    HLD (hyperlipidemia)    Hypothyroidism    Mixed hyperlipidemia 02/20/2019  Psoriasis    Sleep initiation dysfunction 09/27/2011   Past Surgical History:  Procedure Laterality Date   BREAST EXCISIONAL BIOPSY Right 1990   CESAREAN SECTION  1994, 1996   CHOLECYSTECTOMY  1998   MYOMECTOMY  1993   Social History   Socioeconomic History   Marital status: Married    Spouse name: Not on file   Number of children: 2          Occupational History   RN   Smoking status: Former Smoker    Packs/day: 0.50    Years: 4.00    Pack years: 2.00    Types: Cigarettes    Last attempt to quit: 07/12/2004    Years since quitting: 13.1   Smokeless tobacco: Never Used  Substance and Sexual Activity   Alcohol use: Yes    Alcohol/week: 8.4 oz    Types: 14 Glasses of red wine per week     Comment: 2 glasses per day   Drug use: No   Current Outpatient Medications on File Prior to Visit  Medication Sig Dispense Refill   albuterol  (VENTOLIN  HFA) 108 (90 Base) MCG/ACT inhaler Inhale 2 puffs into the lungs every 6 (six) hours as needed. 6.7 g 5   albuterol  (VENTOLIN  HFA) 108 (90 Base) MCG/ACT inhaler Inhale 2 puffs into the lungs every 6 (six) hours as needed. 6.7 g 3   benzonatate  (TESSALON ) 100 MG capsule Take 1-2 capsules (100-200 mg total) by mouth every 8 (eight) hours as needed. 21 capsule 0   budesonide -glycopyrrolate -formoterol  (BREZTRI  AEROSPHERE) 160-9-4.8 MCG/ACT AERO inhaler Inhale 2 puffs into the lungs in the morning and at bedtime. 10.7 g 5   esomeprazole  (NEXIUM ) 20 MG capsule Take 1 capsule (20 mg total) by mouth daily. 90 capsule 3   fluticasone  (FLONASE ) 50 MCG/ACT nasal spray Place 1 spray into both nostrils daily. 16 g 2   furosemide  (LASIX ) 20 MG tablet Take 1 tablet (20 mg total) by mouth daily as needed for edema. Take with potassium 90 tablet 3   HYDROcodone  bit-homatropine (HYCODAN) 5-1.5 MG/5ML syrup Take 5 mLs by mouth every 6 (six) hours as needed for cough. 180 mL 0   levofloxacin  (LEVAQUIN ) 500 MG tablet Take 1 tablet (500 mg total) by mouth daily. 7 tablet 0   levothyroxine  (SYNTHROID ) 137 MCG tablet Take 1 tablet (137 mcg total) by mouth daily before breakfast. 30 tablet 0   lidocaine  (LIDODERM ) 5 % Place 1 patch onto the skin daily. Remove & Discard patch within 12 hours or as directed by MD 30 patch 0   meloxicam  (MOBIC ) 15 MG tablet Take 1 tablet (15 mg total) by mouth daily. (Patient not taking: Reported on 04/30/2024) 30 tablet 0   mometasone  (ELOCON ) 0.1 % cream qd to bid 5 days a week to aa psoriasis prn flares (Patient not taking: Reported on 04/30/2024) 45 g 0   Multiple Vitamin (MULTIVITAMIN) tablet Take 1 tablet by mouth daily.     Naltrexone -buPROPion  HCl ER 8-90 MG TB12 Start 1 tablet every morning for 7 days, then 1 tablet twice daily for 7  days, then 2 tablets every morning and one in the evening 120 tablet 0   potassium chloride  SA (KLOR-CON  M) 20 MEQ tablet Take 1 tablet (20 mEq total) by mouth daily as needed (leg swelling). Take with furosemide  90 tablet 3   rosuvastatin  (CRESTOR ) 10 MG tablet Take 1 tablet (10 mg total) by mouth daily. 90 tablet 3   sertraline  (ZOLOFT ) 100 MG tablet Take  1 tablet (100 mg total) by mouth at bedtime. 90 tablet 3   zolpidem  (AMBIEN ) 5 MG tablet Take 1 - 2 tablets (5 - 10 mg total) by mouth at bedtime as needed. 60 tablet 5   No current facility-administered medications on file prior to visit.   Allergies  Allergen Reactions   Bactrim    Otezla  [Apremilast ] Hives   Penicillins    FH: Diabetes in brother, MGM HTN in father, maternal uncle HL in father Heart disease in mother, maternal aunt, maternal uncle Thyroid  problems-see HPI Cancer in maternal aunt and first cousin: Multiple myeloma, lymphoma, respectively  PE: BP 138/80   Pulse 74   Ht 5' 2 (1.575 m)   Wt 222 lb 12.8 oz (101.1 kg)   SpO2 97%   BMI 40.75 kg/m  Wt Readings from Last 10 Encounters:  05/30/24 222 lb 12.8 oz (101.1 kg)  04/30/24 225 lb (102.1 kg)  03/20/24 224 lb (101.6 kg)  02/28/24 221 lb (100.2 kg)  12/14/23 222 lb (100.7 kg)  10/25/23 222 lb 9.6 oz (101 kg)  10/03/23 221 lb 9.6 oz (100.5 kg)  09/26/23 222 lb (100.7 kg)  08/15/23 223 lb 12.8 oz (101.5 kg)  05/25/23 219 lb (99.3 kg)   Constitutional: overweight, in NAD Eyes: EOMI, no exophthalmos ENT: no thyromegaly, no cervical lymphadenopathy Cardiovascular: RRR, No MRG, + B edema L>R+ Respiratory: CTA B Musculoskeletal: no deformities Skin: no rashes Neurological: no tremor with outstretched hands  ASSESSMENT: 1. Hypothyroidism  PLAN:  1. Patient with longstanding, acquired, hypothyroidism, on levothyroxine  therapy.  Her TSH fluctuate in the past, most likely due to incorrect levothyroxine  administration.  We separated multivitaminsd and  coffee from levothyroxine  and we were able to back off her levothyroxine  dose afterwards.  However, at last visit, her TSH was very high (possibly related to significant weight gain) and we had to increase the dose gradually since then. - latest thyroid  labs reviewed with pt. >> normal: Lab Results  Component Value Date   TSH 1.03 01/03/2024  - she continues on LT4 137 mcg daily, last dose increase was in 10/2023 - pt feels good on this dose.  She previously lost 26 pounds on Wegovy  which prompted levothyroxine  dose reduction, but afterwards, she gained 31 pounds back before last visit which triggered several increases in her dose afterwards.  Since last visit, she only gained 3 pounds. - we discussed about taking the thyroid  hormone every day, with water, >30 minutes before breakfast, separated by >4 hours from acid reflux medications, calcium , iron, multivitamins. Pt. is taking it correctly. - will check thyroid  tests today: TSH and fT4 - If labs are abnormal, she will need to return for repeat TFTs in 1.5 months - I plan to see her back in 1 year, possibly sooner for labs  She needs refills - 90 days.  Orders Placed This Encounter  Procedures   TSH   T4, free   Susan Fendt, MD PhD [facility]

## 2024-05-31 ENCOUNTER — Ambulatory Visit: Payer: Self-pay | Admitting: Internal Medicine

## 2024-05-31 ENCOUNTER — Other Ambulatory Visit: Payer: Self-pay

## 2024-05-31 ENCOUNTER — Other Ambulatory Visit (HOSPITAL_COMMUNITY): Payer: Self-pay

## 2024-05-31 LAB — TSH: TSH: 0.48 m[IU]/L (ref 0.40–4.50)

## 2024-05-31 LAB — T4, FREE: Free T4: 1.6 ng/dL (ref 0.8–1.8)

## 2024-05-31 MED ORDER — LEVOTHYROXINE SODIUM 137 MCG PO TABS
125.0000 ug | ORAL_TABLET | Freq: Every day | ORAL | 3 refills | Status: AC
  Filled 2024-05-31: qty 90, 90d supply, fill #0

## 2024-05-31 NOTE — Addendum Note (Signed)
 Addended by: TRIXIE FILE on: 05/31/2024 08:15 AM   Modules accepted: Orders

## 2024-06-20 ENCOUNTER — Ambulatory Visit: Admitting: Family Medicine

## 2024-07-18 ENCOUNTER — Ambulatory Visit (HOSPITAL_BASED_OUTPATIENT_CLINIC_OR_DEPARTMENT_OTHER): Admitting: Pulmonary Disease

## 2024-09-30 ENCOUNTER — Other Ambulatory Visit

## 2024-10-03 ENCOUNTER — Other Ambulatory Visit

## 2025-05-27 ENCOUNTER — Ambulatory Visit: Admitting: Internal Medicine
